# Patient Record
Sex: Male | Born: 1975 | Race: White | Hispanic: No | Smoking: Current every day smoker
Health system: Southern US, Community
[De-identification: ages and names within clinical notes are randomized; demographics above are authoritative.]

## PROBLEM LIST (undated history)

## (undated) DIAGNOSIS — C801 Malignant (primary) neoplasm, unspecified: Secondary | ICD-10-CM

## (undated) DIAGNOSIS — F1120 Opioid dependence, uncomplicated: Secondary | ICD-10-CM

## (undated) DIAGNOSIS — F909 Attention-deficit hyperactivity disorder, unspecified type: Secondary | ICD-10-CM

## (undated) DIAGNOSIS — F419 Anxiety disorder, unspecified: Secondary | ICD-10-CM

## (undated) DIAGNOSIS — B1920 Unspecified viral hepatitis C without hepatic coma: Secondary | ICD-10-CM

## (undated) DIAGNOSIS — F1190 Opioid use, unspecified, uncomplicated: Secondary | ICD-10-CM

## (undated) DIAGNOSIS — E785 Hyperlipidemia, unspecified: Secondary | ICD-10-CM

## (undated) DIAGNOSIS — N200 Calculus of kidney: Secondary | ICD-10-CM

## (undated) DIAGNOSIS — F1910 Other psychoactive substance abuse, uncomplicated: Secondary | ICD-10-CM

## (undated) DIAGNOSIS — F19959 Other psychoactive substance use, unspecified with psychoactive substance-induced psychotic disorder, unspecified: Secondary | ICD-10-CM

## (undated) DIAGNOSIS — F329 Major depressive disorder, single episode, unspecified: Secondary | ICD-10-CM

## (undated) DIAGNOSIS — F1994 Other psychoactive substance use, unspecified with psychoactive substance-induced mood disorder: Secondary | ICD-10-CM

## (undated) HISTORY — DX: Malignant (primary) neoplasm, unspecified: C80.1

## (undated) HISTORY — DX: Other psychoactive substance abuse, uncomplicated: F19.10

## (undated) HISTORY — PX: HERNIA REPAIR: SHX51

## (undated) HISTORY — DX: Anxiety disorder, unspecified: F41.9

---

## 1999-10-09 ENCOUNTER — Emergency Department (HOSPITAL_COMMUNITY): Admission: EM | Admit: 1999-10-09 | Discharge: 1999-10-09 | Payer: Self-pay | Admitting: Emergency Medicine

## 2002-07-14 ENCOUNTER — Emergency Department (HOSPITAL_COMMUNITY): Admission: EM | Admit: 2002-07-14 | Discharge: 2002-07-14 | Payer: Self-pay | Admitting: Emergency Medicine

## 2008-03-11 ENCOUNTER — Encounter (INDEPENDENT_AMBULATORY_CARE_PROVIDER_SITE_OTHER): Payer: Self-pay | Admitting: Emergency Medicine

## 2008-03-11 ENCOUNTER — Emergency Department (HOSPITAL_COMMUNITY): Admission: EM | Admit: 2008-03-11 | Discharge: 2008-03-11 | Payer: Self-pay | Admitting: Emergency Medicine

## 2008-03-11 ENCOUNTER — Ambulatory Visit: Payer: Self-pay | Admitting: Vascular Surgery

## 2008-05-07 ENCOUNTER — Emergency Department (HOSPITAL_COMMUNITY): Admission: EM | Admit: 2008-05-07 | Discharge: 2008-05-07 | Payer: Self-pay | Admitting: Emergency Medicine

## 2008-07-12 ENCOUNTER — Emergency Department (HOSPITAL_COMMUNITY): Admission: EM | Admit: 2008-07-12 | Discharge: 2008-07-12 | Payer: Self-pay | Admitting: Emergency Medicine

## 2008-11-28 ENCOUNTER — Emergency Department (HOSPITAL_COMMUNITY): Admission: EM | Admit: 2008-11-28 | Discharge: 2008-11-28 | Payer: Self-pay | Admitting: Emergency Medicine

## 2009-03-28 ENCOUNTER — Emergency Department (HOSPITAL_COMMUNITY): Admission: EM | Admit: 2009-03-28 | Discharge: 2009-03-28 | Payer: Self-pay | Admitting: Emergency Medicine

## 2009-04-09 ENCOUNTER — Emergency Department (HOSPITAL_COMMUNITY): Admission: EM | Admit: 2009-04-09 | Discharge: 2009-04-09 | Payer: Self-pay | Admitting: Emergency Medicine

## 2009-05-12 ENCOUNTER — Emergency Department (HOSPITAL_COMMUNITY): Admission: EM | Admit: 2009-05-12 | Discharge: 2009-05-12 | Payer: Self-pay | Admitting: Emergency Medicine

## 2009-07-17 ENCOUNTER — Emergency Department (HOSPITAL_COMMUNITY): Admission: EM | Admit: 2009-07-17 | Discharge: 2009-07-17 | Payer: Self-pay | Admitting: Emergency Medicine

## 2009-09-03 ENCOUNTER — Emergency Department (HOSPITAL_COMMUNITY): Admission: EM | Admit: 2009-09-03 | Discharge: 2009-09-03 | Payer: Self-pay | Admitting: Emergency Medicine

## 2009-09-09 ENCOUNTER — Emergency Department (HOSPITAL_COMMUNITY): Admission: EM | Admit: 2009-09-09 | Discharge: 2009-09-09 | Payer: Self-pay | Admitting: Emergency Medicine

## 2009-12-19 ENCOUNTER — Emergency Department (HOSPITAL_COMMUNITY): Admission: EM | Admit: 2009-12-19 | Discharge: 2009-12-19 | Payer: Self-pay | Admitting: Emergency Medicine

## 2010-01-19 ENCOUNTER — Emergency Department (HOSPITAL_COMMUNITY)
Admission: EM | Admit: 2010-01-19 | Discharge: 2010-01-19 | Payer: Self-pay | Source: Home / Self Care | Admitting: Emergency Medicine

## 2010-01-22 ENCOUNTER — Emergency Department (HOSPITAL_COMMUNITY)
Admission: EM | Admit: 2010-01-22 | Discharge: 2010-01-22 | Payer: Self-pay | Source: Home / Self Care | Admitting: Emergency Medicine

## 2010-04-08 ENCOUNTER — Emergency Department (HOSPITAL_COMMUNITY)
Admission: EM | Admit: 2010-04-08 | Discharge: 2010-04-09 | Disposition: A | Discharge status: Home / Self Care | Payer: Self-pay | Attending: Emergency Medicine | Admitting: Emergency Medicine

## 2010-04-08 DIAGNOSIS — K625 Hemorrhage of anus and rectum: Secondary | ICD-10-CM | POA: Insufficient documentation

## 2010-04-08 DIAGNOSIS — F17200 Nicotine dependence, unspecified, uncomplicated: Secondary | ICD-10-CM | POA: Insufficient documentation

## 2010-04-08 DIAGNOSIS — R110 Nausea: Secondary | ICD-10-CM | POA: Insufficient documentation

## 2010-04-08 DIAGNOSIS — K5900 Constipation, unspecified: Secondary | ICD-10-CM | POA: Insufficient documentation

## 2010-04-08 DIAGNOSIS — R109 Unspecified abdominal pain: Secondary | ICD-10-CM | POA: Insufficient documentation

## 2010-04-08 LAB — COMPREHENSIVE METABOLIC PANEL
Alkaline Phosphatase: 85 U/L (ref 39–117)
BUN: 13 mg/dL (ref 6–23)
CO2: 27 mEq/L (ref 19–32)
Chloride: 102 mEq/L (ref 96–112)
Creatinine, Ser: 0.82 mg/dL (ref 0.4–1.5)
GFR calc non Af Amer: >60 mL/min (ref >60)
Glucose, Bld: 149 mg/dL — ABNORMAL HIGH (ref 70–99)
Potassium: 3.7 mEq/L (ref 3.5–5.1)
Total Bilirubin: 0.6 mg/dL (ref 0.3–1.2)

## 2010-04-08 LAB — RAPID URINE DRUG SCREEN, HOSP PERFORMED
Barbiturates: NOT DETECTED
Cocaine: NOT DETECTED
Opiates: POSITIVE — AB

## 2010-04-08 LAB — DIFFERENTIAL
Basophils Absolute: 0 10*3/uL (ref 0.0–0.1)
Basophils Relative: 0 % (ref 0–1)
Eosinophils Relative: 2 % (ref 0–5)
Monocytes Absolute: 0.9 10*3/uL (ref 0.1–1.0)
Neutro Abs: 9.2 10*3/uL — ABNORMAL HIGH (ref 1.7–7.7)

## 2010-04-08 LAB — CBC
MCHC: 33.6 g/dL (ref 30.0–36.0)
RDW: 13.3 % (ref 11.5–15.5)
WBC: 13 10*3/uL — ABNORMAL HIGH (ref 4.0–10.5)

## 2010-04-08 LAB — ETHANOL: Alcohol, Ethyl (B): <5 mg/dL (ref 0–10)

## 2010-04-09 ENCOUNTER — Emergency Department (HOSPITAL_COMMUNITY)
Admission: EM | Admit: 2010-04-09 | Discharge: 2010-04-09 | Disposition: A | Discharge status: Home / Self Care | Payer: Self-pay | Attending: Emergency Medicine | Admitting: Emergency Medicine

## 2010-04-09 DIAGNOSIS — K625 Hemorrhage of anus and rectum: Secondary | ICD-10-CM | POA: Insufficient documentation

## 2010-04-09 DIAGNOSIS — R109 Unspecified abdominal pain: Secondary | ICD-10-CM | POA: Insufficient documentation

## 2010-04-09 DIAGNOSIS — K5900 Constipation, unspecified: Secondary | ICD-10-CM | POA: Insufficient documentation

## 2010-04-09 DIAGNOSIS — R110 Nausea: Secondary | ICD-10-CM | POA: Insufficient documentation

## 2010-04-09 LAB — OCCULT BLOOD, POC DEVICE: Fecal Occult Bld: POSITIVE

## 2010-04-13 ENCOUNTER — Emergency Department (HOSPITAL_COMMUNITY)
Admission: EM | Admit: 2010-04-13 | Discharge: 2010-04-13 | Discharge status: Against Medical Advice | Payer: Self-pay | Attending: Emergency Medicine | Admitting: Emergency Medicine

## 2010-04-13 ENCOUNTER — Emergency Department (HOSPITAL_COMMUNITY): Payer: Self-pay

## 2010-04-13 DIAGNOSIS — R079 Chest pain, unspecified: Secondary | ICD-10-CM | POA: Insufficient documentation

## 2010-04-13 DIAGNOSIS — Z5320 Procedure and treatment not carried out because of patient's decision for unspecified reasons: Secondary | ICD-10-CM | POA: Insufficient documentation

## 2010-05-02 LAB — DIFFERENTIAL
Basophils Absolute: 0 10*3/uL (ref 0.0–0.1)
Eosinophils Relative: 3 % (ref 0–5)
Lymphocytes Relative: 18 % (ref 12–46)
Neutrophils Relative %: 70 % (ref 43–77)

## 2010-05-02 LAB — URINALYSIS, ROUTINE W REFLEX MICROSCOPIC
Ketones, ur: 40 mg/dL — AB
Nitrite: NEGATIVE
Urobilinogen, UA: 1 mg/dL (ref 0.0–1.0)

## 2010-05-02 LAB — HEPATIC FUNCTION PANEL
ALT: 15 U/L (ref 0–53)
Indirect Bilirubin: 1.3 mg/dL — ABNORMAL HIGH (ref 0.3–0.9)
Total Protein: 7 g/dL (ref 6.0–8.3)

## 2010-05-02 LAB — BASIC METABOLIC PANEL
BUN: 6 mg/dL (ref 6–23)
Calcium: 9.4 mg/dL (ref 8.4–10.5)
Creatinine, Ser: 0.78 mg/dL (ref 0.4–1.5)
GFR calc non Af Amer: >60 mL/min (ref >60)
Glucose, Bld: 95 mg/dL (ref 70–99)
Potassium: 3.2 mEq/L — ABNORMAL LOW (ref 3.5–5.1)

## 2010-05-02 LAB — CBC
HCT: 50.4 % (ref 39.0–52.0)
Platelets: 219 10*3/uL (ref 150–400)
RDW: 12.9 % (ref 11.5–15.5)

## 2010-05-02 LAB — CK TOTAL AND CKMB (NOT AT ARMC)
Relative Index: 0.9 (ref 0.0–2.5)
Total CK: 482 U/L — ABNORMAL HIGH (ref 7–232)

## 2010-05-02 LAB — RAPID URINE DRUG SCREEN, HOSP PERFORMED: Tetrahydrocannabinol: POSITIVE — AB

## 2010-05-02 LAB — TROPONIN I: Troponin I: <0.01 ng/mL (ref 0.00–0.06)

## 2011-04-06 ENCOUNTER — Emergency Department (HOSPITAL_COMMUNITY)
Admission: EM | Admit: 2011-04-06 | Discharge: 2011-04-06 | Disposition: A | Discharge status: Home / Self Care | Payer: Self-pay | Attending: Emergency Medicine | Admitting: Emergency Medicine

## 2011-04-06 ENCOUNTER — Encounter (HOSPITAL_COMMUNITY): Payer: Self-pay | Admitting: Emergency Medicine

## 2011-04-06 DIAGNOSIS — J020 Streptococcal pharyngitis: Secondary | ICD-10-CM | POA: Insufficient documentation

## 2011-04-06 DIAGNOSIS — R509 Fever, unspecified: Secondary | ICD-10-CM | POA: Insufficient documentation

## 2011-04-06 HISTORY — DX: Opioid dependence, uncomplicated: F11.20

## 2011-04-06 HISTORY — DX: Opioid use, unspecified, uncomplicated: F11.90

## 2011-04-06 MED ORDER — AMOXICILLIN 500 MG PO CAPS: 500.0000 mg | ORAL_CAPSULE | Freq: Three times a day (TID) | ORAL | Status: AC

## 2011-04-06 MED ORDER — DEXAMETHASONE SODIUM PHOSPHATE 10 MG/ML IJ SOLN
10.0000 mg | Freq: Once | INTRAMUSCULAR | Status: AC
  Administered 2011-04-06: 10 mg via INTRAMUSCULAR
  Filled 2011-04-06: qty 1

## 2011-04-06 NOTE — ED Provider Notes (Signed)
History     CSN: [id number]  Arrival date & time 04/06/11  1754   First MD Initiated Contact with Patient 04/06/11 1837      Chief Complaint  Patient presents with  . Sore Throat    (Consider location/radiation/quality/duration/timing/severity/associated sxs/prior treatment) HPI Comments: Patient here with two day history of sore throat with fever, headache and swollen glands - reports pain with swallowing but is able to swallow without difficulty - fever to 102 today - denies shortness of breath, nausea or vomiting  Patient is a 36 y.o. male presenting with pharyngitis. The history is provided by the patient. No language interpreter was used.  Sore Throat This is a new problem. The current episode started yesterday. The problem occurs constantly. The problem has been unchanged. Associated symptoms include a fever, headaches, a sore throat and swollen glands. Pertinent negatives include no abdominal pain, anorexia, arthralgias, change in bowel habit, chest pain, chills, congestion, coughing, diaphoresis, fatigue, joint swelling, myalgias, nausea, neck pain, numbness, rash, urinary symptoms, vertigo, visual change, vomiting or weakness. The symptoms are aggravated by swallowing. He has tried nothing for the symptoms. The treatment provided no relief.    Past Medical History  Diagnosis Date  . Methadone use     clinic each day    History reviewed. No pertinent past surgical history.  No family history on file.  History  Substance Use Topics  . Smoking status: Not on file  . Smokeless tobacco: Not on file  . Alcohol Use:       Review of Systems  Constitutional: Positive for fever. Negative for chills, diaphoresis and fatigue.  HENT: Positive for sore throat. Negative for congestion and neck pain.   Respiratory: Negative for cough.   Cardiovascular: Negative for chest pain.  Gastrointestinal: Negative for nausea, vomiting, abdominal pain, anorexia and change in bowel habit.    Musculoskeletal: Negative for myalgias, joint swelling and arthralgias.  Skin: Negative for rash.  Neurological: Positive for headaches. Negative for vertigo, weakness and numbness.  All other systems reviewed and are negative.    Allergies  Review of patient's allergies indicates no known allergies.  Home Medications   Current Outpatient Rx  Name Route Sig Dispense Refill  . ACETAMINOPHEN 500 MG PO TABS Oral Take 1,000 mg by mouth every 6 (six) hours as needed. pain    . DIPHENHYDRAMINE HCL 25 MG PO CAPS Oral Take 25 mg by mouth every 6 (six) hours as needed. Allergies/sinus drainage    . METHADONE HCL 10 MG/5ML PO SOLN Oral Take 120 mg by mouth daily.      BP 124/74  Pulse 91  Temp(Src) 98.3 F (36.8 C) (Oral)  Resp 18  SpO2 100%  Physical Exam  Nursing note and vitals reviewed. Constitutional: He is oriented to person, place, and time. He appears well-developed and well-nourished. No distress.  HENT:  Head: Normocephalic and atraumatic.  Right Ear: External ear normal.  Left Ear: External ear normal.  Nose: Nose normal.  Mouth/Throat: Oropharyngeal exudate present.  Eyes: Conjunctivae are normal. Pupils are equal, round, and reactive to light. No scleral icterus.  Neck: Normal range of motion. Neck supple.       Anterior cervical lymphadenopathy  Cardiovascular: Normal rate and normal heart sounds.  Exam reveals no gallop and no friction rub.   No murmur heard. Pulmonary/Chest: Effort normal and breath sounds normal. No respiratory distress. He exhibits no tenderness.  Abdominal: Soft. Bowel sounds are normal. He exhibits no distension.  Musculoskeletal: Normal range  of motion. He exhibits no edema and no tenderness.  Lymphadenopathy:    He has cervical adenopathy.  Neurological: He is alert and oriented to person, place, and time. No cranial nerve deficit.  Skin: Skin is warm and dry. No rash noted. No erythema. No pallor.  Psychiatric: He has a normal mood and  affect. His behavior is normal. Judgment and thought content normal.    ED Course  Procedures (including critical care time)  Labs Reviewed - No data to display No results found.   Strep pharyngitis   MDM  Patient with CENTOR score of 4 - plan to treat for strep - will also give IM decadron for pain control.       Izola Price [place], [place] 04/06/11 1914

## 2011-04-06 NOTE — ED Provider Notes (Signed)
Medical screening examination/treatment/procedure(s) were performed by non-physician practitioner and as supervising physician I was immediately available for consultation/collaboration.   Loren Racer, MD 04/06/11 2352

## 2011-04-06 NOTE — Discharge Instructions (Signed)
Strep Infections Streptococcal (strep) infections are caused by streptococcal germs (bacteria). Strep infections are very contagious. Strep infections can occur in:  Ears.   The nose.   The throat.   Sinuses.   Skin.   Blood.   Lungs.   Spinal fluid.   Urine.  Strep throat is the most common bacterial infection in children. The symptoms of a Strep infection usually get better in 2 to 3 days after starting medicine that kills germs (antibiotics). Strep is usually not contagious after 36 to 48 hours of antibiotic treatment. Strep infections that are not treated can cause serious complications. These include gland infections, throat abscess, rheumatic fever and kidney disease. DIAGNOSIS  The diagnosis of strep is made by:  A culture for the strep germ.  TREATMENT  These infections require oral antibiotics for a full 10 days, an antibiotic shot or antibiotics given into the vein (intravenous, IV). HOME CARE INSTRUCTIONS   Be sure to finish all antibiotics even if feeling better.   Only take over-the-counter medicines for pain, discomfort and or fever, as directed by your caregiver.   Close contacts that have a fever, sore throat or illness symptoms should see their caregiver right away.   You or your child may return to work, school or daycare if the fever and pain are better in 2 to 3 days after starting antibiotics.  SEEK MEDICAL CARE IF:   You or your child has an oral temperature above 102 F (38.9 C).   Your baby is older than 3 months with a rectal temperature of 100.5 F (38.1 C) or higher for more than 1 day.   You or your child is not better in 3 days.  SEEK IMMEDIATE MEDICAL CARE IF:   You or your child has an oral temperature above 102 F (38.9 C), not controlled by medicine.   Your baby is older than 3 months with a rectal temperature of 102 F (38.9 C) or higher.   Your baby is 23 months old or younger with a rectal temperature of 100.4 F (38 C) or  higher.   There is a spreading rash.   There is difficulty swallowing or breathing.   There is increased pain or swelling.  Document Released: 03/09/2004 Document Revised: 10/12/2010 Document Reviewed: 12/16/2008 [facility] Patient Information 2012 [place], [place].Salt Water Gargle This solution will help make your mouth and throat feel better. HOME CARE INSTRUCTIONS   Mix 1 teaspoon of salt in 8 ounces of warm water.   Gargle with this solution as much or often as you need or as directed. Swish and gargle gently if you have any sores or wounds in your mouth.   Do not swallow this mixture.  Document Released: 11/04/2003 Document Revised: 10/12/2010 Document Reviewed: 03/27/2008 [facility] Patient Information 2012 [place], [place].Strep Throat Strep throat is an infection of the throat. It is caused by a germ. Strep throat spreads from person to person by coughing, sneezing, or close contact. HOME CARE  Rinse your mouth (gargle) with warm salt water (1 teaspoon salt in 1 cup of water). Do this 3 to 4 times per day or as needed for comfort.   Family members with a sore throat or fever should see a doctor.   Make sure everyone in your house washes their hands well.   Do not share food, drinking cups, or personal items.   Eat soft foods until your sore throat gets better.   Drink enough water and fluids to keep your pee (urine)  clear or pale yellow.   Rest.   Stay home from school, daycare, or work until you have taken medicine for 24 hours.   Only take medicine as told by your doctor.   Take your medicine as told. Finish it even if you start to feel better.  GET HELP RIGHT AWAY IF:   You have new problems, such as throwing up (vomiting) or bad headaches.   You have a stiff or painful neck, chest pain, trouble breathing, or trouble swallowing.   You have very bad throat pain, drooling, or changes in your voice.   Your neck puffs up (swells) or gets red and tender.   You have  a fever.   You are very tired, your mouth is dry, or you are peeing less than normal.   You cannot wake up completely.   You get a rash, cough, or earache.   You have green, yellow-brown, or bloody spit.   Your pain does not get better with medicine.  MAKE SURE YOU:   Understand these instructions.   Will watch your condition.   Will get help right away if you are not doing well or get worse.  Document Released: 07/19/2007 Document Revised: 10/12/2010 Document Reviewed: 03/31/2010 [facility] Patient Information 2012 [place], [place].

## 2011-09-07 ENCOUNTER — Emergency Department (HOSPITAL_COMMUNITY)
Admission: EM | Admit: 2011-09-07 | Discharge: 2011-09-09 | Disposition: A | Discharge status: Home / Self Care | Payer: Self-pay | Attending: Emergency Medicine | Admitting: Emergency Medicine

## 2011-09-07 ENCOUNTER — Encounter (HOSPITAL_COMMUNITY): Payer: Self-pay | Admitting: *Deleted

## 2011-09-07 DIAGNOSIS — Z008 Encounter for other general examination: Secondary | ICD-10-CM | POA: Insufficient documentation

## 2011-09-07 DIAGNOSIS — F1110 Opioid abuse, uncomplicated: Secondary | ICD-10-CM | POA: Insufficient documentation

## 2011-09-07 HISTORY — DX: Unspecified viral hepatitis C without hepatic coma: B19.20

## 2011-09-07 LAB — COMPREHENSIVE METABOLIC PANEL
AST: 55 U/L — ABNORMAL HIGH (ref 0–37)
Albumin: 4.3 g/dL (ref 3.5–5.2)
CO2: 22 mEq/L (ref 19–32)
Calcium: 9.6 mg/dL (ref 8.4–10.5)
Creatinine, Ser: 0.7 mg/dL (ref 0.50–1.35)
GFR calc non Af Amer: >90 mL/min (ref >90)

## 2011-09-07 LAB — RAPID URINE DRUG SCREEN, HOSP PERFORMED
Amphetamines: NOT DETECTED
Benzodiazepines: NOT DETECTED
Opiates: POSITIVE — AB

## 2011-09-07 LAB — CBC
MCH: 30.2 pg (ref 26.0–34.0)
MCV: 88.3 fL (ref 78.0–100.0)
Platelets: 305 10*3/uL (ref 150–400)
RDW: 13.1 % (ref 11.5–15.5)

## 2011-09-07 LAB — ACETAMINOPHEN LEVEL: Acetaminophen (Tylenol), Serum: <15 ug/mL (ref 10–30)

## 2011-09-07 LAB — ETHANOL: Alcohol, Ethyl (B): <11 mg/dL (ref 0–11)

## 2011-09-07 MED ORDER — ZOLPIDEM TARTRATE 5 MG PO TABS
5.0000 mg | ORAL_TABLET | Freq: Every evening | ORAL | Status: DC | PRN
  Administered 2011-09-07 – 2011-09-08 (×2): 5 mg via ORAL
  Filled 2011-09-07 (×2): qty 1

## 2011-09-07 MED ORDER — ALUM & MAG HYDROXIDE-SIMETH 200-200-20 MG/5ML PO SUSP: 30.0000 mL | ORAL | Status: DC | PRN

## 2011-09-07 MED ORDER — ONDANSETRON HCL 4 MG PO TABS: 4.0000 mg | ORAL_TABLET | Freq: Three times a day (TID) | ORAL | Status: DC | PRN

## 2011-09-07 MED ORDER — NICOTINE 21 MG/24HR TD PT24
21.0000 mg | MEDICATED_PATCH | Freq: Every day | TRANSDERMAL | Status: DC
  Administered 2011-09-07 – 2011-09-09 (×3): 21 mg via TRANSDERMAL
  Filled 2011-09-07 (×3): qty 1

## 2011-09-07 MED ORDER — IBUPROFEN 600 MG PO TABS: 600.0000 mg | ORAL_TABLET | Freq: Three times a day (TID) | ORAL | Status: DC | PRN

## 2011-09-07 MED ORDER — ACETAMINOPHEN 325 MG PO TABS: 650.0000 mg | ORAL_TABLET | ORAL | Status: DC | PRN

## 2011-09-07 NOTE — ED Provider Notes (Signed)
History     CSN: [id number]  Arrival date & time 09/07/11  1858   First MD Initiated Contact with Patient 09/07/11 1947      Chief Complaint  Patient presents with  . Addiction Problem    (Consider location/radiation/quality/duration/timing/severity/associated sxs/prior treatment) HPI Patient is a 36 year old male with history of opiate abuse with most recent use today. Patient has been using prescription drugs as well as injectable heroin. He reports that he would like inpatient rehabilitation. His last inpatient stay for this was a year ago. Patient tried methadone clinic but was unable to afford this. Patient has recently had more difficulties as he has been more depressed since finding out he is hepatitis C. He denies any pain or other complaints. He denies any suicidal or homicidal ideation. He denies any hallucinations. Patient has history of depression but no other mental health issues. He has been detoxed previously at [facility].  There are no other associated or modifying factors. Past Medical History  Diagnosis Date  . Methadone use     clinic each day  . Hepatitis C     Past Surgical History  Procedure Date  . Hernia repair     No family history on file.  History  Substance Use Topics  . Smoking status: Current Everyday Smoker  . Smokeless tobacco: Not on file  . Alcohol Use: No      Review of Systems  Constitutional: Negative.   HENT: Negative.   Eyes: Negative.   Respiratory: Negative.   Cardiovascular: Negative.   Gastrointestinal: Negative.   Genitourinary: Negative.   Musculoskeletal: Negative.   Neurological: Negative.   Hematological: Negative.   Psychiatric/Behavioral: Positive for dysphoric mood. Negative for suicidal ideas and hallucinations.       Substance abuse.  All other systems reviewed and are negative.    Allergies  Review of patient's allergies indicates no known allergies.  Home Medications   Current Outpatient Rx  Name Route  Sig Dispense Refill  . DIPHENHYDRAMINE HCL 25 MG PO CAPS Oral Take 25 mg by mouth every 6 (six) hours as needed. Allergies/sinus drainage    . OXYCODONE HCL ER 20 MG PO TB12 Oral Take 20 mg by mouth once.    . OXYCODONE-ACETAMINOPHEN 10-325 MG PO TABS Oral Take 1 tablet by mouth every 8 (eight) hours as needed. For pain.    . TRAZODONE HCL 100 MG PO TABS Oral Take 100 mg by mouth at bedtime.      BP 119/77  Pulse 95  Temp 99.1 F (37.3 C) (Oral)  Resp 20  Wt 222 lb (100.699 kg)  SpO2 97%  Physical Exam  Nursing note and vitals reviewed. GEN: Well-developed, well-nourished male in no distress HEENT: Atraumatic, normocephalic. Oropharynx clear without erythema EYES: PERRLA BL, no scleral icterus. NECK: Trachea midline, no meningismus CV: regular rate and rhythm. No murmurs, rubs, or gallops PULM: No respiratory distress.  No crackles, wheezes, or rales. GI: soft, non-tender. No guarding, rebound, or tenderness. + bowel sounds  GU: deferred Neuro: cranial nerves grossly 2-12 intact, no abnormalities of strength or sensation, A and O x 3 MSK: Patient moves all 4 extremities symmetrically, no deformity, edema, or injury noted Skin: No rashes petechiae, purpura, or jaundice Psych: Denies suicidal or homicidal ideation. Denies hallucinations. Patient requests detox from opiates abuse.  ED Course  Procedures (including critical care time)  Labs Reviewed  URINE RAPID DRUG SCREEN (HOSP PERFORMED) - Abnormal; Notable for the following:    Opiates POSITIVE (*)  All other components within normal limits  CBC - Abnormal; Notable for the following:    WBC 13.1 (*)     RBC 5.83 (*)     Hemoglobin 17.6 (*)     All other components within normal limits  COMPREHENSIVE METABOLIC PANEL - Abnormal; Notable for the following:    AST 55 (*)     ALT 62 (*)     All other components within normal limits  ETHANOL  ACETAMINOPHEN LEVEL   No results found.   1. Opiate abuse, continuous        MDM  Patient was evaluated by myself. Clearance labs were remarkable for being positive for opiates. Patient was not suicidal, homicidal, or psychotic. A consult was placed to act.        Cyndra Numbers, MD 09/07/11 2135

## 2011-09-07 NOTE — ED Notes (Signed)
Pt belongings: in triage locker 4.

## 2011-09-07 NOTE — ED Notes (Signed)
Pt reports he is here for detox from opiates. Pt reports taking oxycontin 20mg  last dose yesterday. Pt also took percocet 10mg   today at 12.  Pt denies ETOH and illegal drugs other than marijuana with last use a month ago.  Pt states he takes as many pills as he can afford each day, but has been unable to pay for much in the past week or two and states he now feels like he is withdrawing. Pt reports body aches, chills, nervousness and irritable. Pt denies SI/HI.  Pt was helped with detox from opiates about a month ago via a methadone clinic.

## 2011-09-07 NOTE — ED Notes (Signed)
Patient mother can be reached [phone] [name]).

## 2011-09-07 NOTE — ED Notes (Addendum)
Pt belongings: Clothing, sandals and wallet.  Placed in locker 2 triage.

## 2011-09-08 NOTE — ED Notes (Signed)
Pt up to bathroom.

## 2011-09-08 NOTE — ED Notes (Signed)
Up to bathroom

## 2011-09-08 NOTE — ED Notes (Signed)
Pt up using the phone 

## 2011-09-09 MED ORDER — CLONIDINE HCL 0.1 MG PO TABS
0.1000 mg | ORAL_TABLET | Freq: Two times a day (BID) | ORAL | Status: DC
  Filled 2011-09-09: qty 1

## 2011-09-09 MED ORDER — LORAZEPAM 1 MG PO TABS
1.0000 mg | ORAL_TABLET | Freq: Once | ORAL | Status: AC
  Administered 2011-09-09: 1 mg via ORAL
  Filled 2011-09-09: qty 1

## 2011-09-09 NOTE — ED Notes (Signed)
Dr kohut into see 

## 2011-09-09 NOTE — ED Notes (Signed)
[facility] here to transport.

## 2011-09-09 NOTE — BH Assessment (Signed)
Assessment Note   Victor Gomez is an 36 y.o. male who presents to [facility] voluntarily requesting detox and treatment for opiates. Pt reports using varying amounts of pain medication and heroin, daily, for the past 3 years.  He states he started using infrequently when he was 27. Pt states he started using daily after being laid off from work. He reports seeking treatment 2 years ago and being put on methadone. He states he used methadone for 1 year and then attempted to wean himself off due to it causing him to have night terrors. Pt reports while trying to wean himself of methadone he started using heroin again. Pt endorses withdrawal symptoms including, hot and cold flashes, sweating, anxiety, and headache. Pt is seeking longer term treatment.  Pt denies SI, HI, and AHVH. He reports he is highly motivated to change. He states "I want to get my life straightened out, I'm tired of living this way." He further states he wants treatment so he can "be with my little girl." Pt identifies 53 year old daughter and mother as social supports and motivators for change. Pt lives with his mother, who reports is very supportive. He can return to live with her after treatment.   Axis I: 304.00  Opioid Dependence  Axis II: Deferred Axis III:  Past Medical History  Diagnosis Date  . Methadone use     clinic each day  . Hepatitis C    Axis IV: other psychosocial or environmental problems and problems related to social environment Axis V: 51-60 moderate symptoms  Past Medical History:  Past Medical History  Diagnosis Date  . Methadone use     clinic each day  . Hepatitis C     Past Surgical History  Procedure Date  . Hernia repair     Family History: No family history on file.  Social History:  reports that he has been smoking.  He does not have any smokeless tobacco history on file. He reports that he uses illicit drugs (Marijuana). He reports that he does not drink alcohol.  Additional Social  History:  Alcohol / Drug Use History of alcohol / drug use?: Yes Substance #1 Name of Substance 1: opaites, both pain medication and heroin 1 - Age of First Use: 27 1 - Amount (size/oz): varries 1 - Frequency: daily 1 - Duration: 3 years 1 - Last Use / Amount: 4 days ago, 1 bag  CIWA: CIWA-Ar BP: 121/85 mmHg Pulse Rate: 89  COWS:    Allergies: No Known Allergies  Home Medications:  (Not in a hospital admission)  OB/GYN Status:  No LMP for male patient.  General Assessment Data Location of Assessment: [facility] ED Living Arrangements: Parent Can pt return to current living arrangement?: Yes Admission Status: Voluntary Is patient capable of signing voluntary admission?: Yes Transfer from: Acute Hospital Referral Source: MD  Education Status Is patient currently in school?: No Highest grade of school patient has completed: 10  Risk to self Suicidal Ideation: No Suicidal Intent: No Is patient at risk for suicide?: No Suicidal Plan?: No Access to Means: No What has been your use of drugs/alcohol within the last 12 months?: opaites Previous Attempts/Gestures: No How many times?: 0  Other Self Harm Risks: IV drug use Triggers for Past Attempts: None known Intentional Self Injurious Behavior: None Family Suicide History: No Recent stressful life event(s): Financial Problems;Job Loss Persecutory voices/beliefs?: No Depression: Yes Depression Symptoms: Guilt;Loss of interest in usual pleasures Substance abuse history and/or treatment for  substance abuse?: Yes Suicide prevention information given to non-admitted patients: Not applicable  Risk to Others Homicidal Ideation: No Thoughts of Harm to Others: No Current Homicidal Intent: No Current Homicidal Plan: No Access to Homicidal Means: No Identified Victim: none History of harm to others?: No Assessment of Violence: None Noted Violent Behavior Description: cooperative Does patient have access to weapons?: No Criminal  Charges Pending?: No Does patient have a court date: No  Psychosis Hallucinations: None noted Delusions: None noted  Mental Status Report Appear/Hygiene: Disheveled Eye Contact: Good Motor Activity: Unremarkable Speech: Logical/coherent Level of Consciousness: Quiet/awake Mood: Sad Affect: Appropriate to circumstance Anxiety Level: Minimal Thought Processes: Coherent;Relevant Judgement: Unimpaired Orientation: Person;Place;Time;Situation Obsessive Compulsive Thoughts/Behaviors: None  Cognitive Functioning Concentration: Normal Memory: Recent Intact;Remote Intact IQ: Average Insight: Fair Impulse Control: Poor Appetite: Fair Weight Loss: 0  Weight Gain: 0  Sleep: No Change Vegetative Symptoms: None  ADLScreening [facility] Assessment Services) Patient's cognitive ability adequate to safely complete daily activities?: Yes Patient able to express need for assistance with ADLs?: Yes Independently performs ADLs?: Yes  Abuse/Neglect [facility]) Physical Abuse: Denies Verbal Abuse: Denies Sexual Abuse: Denies  Prior Inpatient Therapy Prior Inpatient Therapy: Yes Prior Therapy Dates: 2011 Prior Therapy Facilty/Provider(s): [facility] Reason for Treatment: detox  Prior Outpatient Therapy Prior Outpatient Therapy: Yes Prior Therapy Dates: 2011 [facility] Reason for Treatment: SA and depression  ADL Screening (condition at time of admission) Patient's cognitive ability adequate to safely complete daily activities?: Yes Patient able to express need for assistance with ADLs?: Yes Independently performs ADLs?: Yes Weakness of Legs: None Weakness of Arms/Hands: None  Home Assistive Devices/Equipment Home Assistive Devices/Equipment: None    Abuse/Neglect Assessment (Assessment to be complete while patient is alone) Physical Abuse: Denies Verbal Abuse: Denies Sexual Abuse: Denies Exploitation of patient/patient's resources: Denies Self-Neglect:  Denies Values / Beliefs Cultural Requests During Hospitalization: None Spiritual Requests During Hospitalization: None   Advance Directives (For Healthcare) Advance Directive: Patient does not have advance directive;Patient would not like information Pre-existing out of facility DNR order (yellow form or pink MOST form): No Nutrition Screen Diet: Regular Unintentional weight loss greater than 10lbs within the last month: No Problems chewing or swallowing foods and/or liquids: No Home Tube Feeding or Total Parenteral Nutrition (TPN): No Patient appears severely malnourished: No  Additional Information 1:1 In Past 12 Months?: No CIRT Risk: No Elopement Risk: No Does patient have medical clearance?: Yes     Disposition:  Disposition Disposition of Patient: Referred to;Inpatient treatment program Type of inpatient treatment program: Adult Patient referred to: [facility]  On Site Evaluation by:   Reviewed with Physician:     Georgina Quint A 09/09/2011 2:16 AM

## 2011-09-09 NOTE — ED Notes (Signed)
Up to the desk on the phone 

## 2011-09-09 NOTE — ED Notes (Signed)
Report called to [facility] RN at [facility]

## 2011-09-09 NOTE — ED Notes (Signed)
1 duffle bag, 1 pt belonging bag give to pt

## 2011-10-04 ENCOUNTER — Encounter (HOSPITAL_COMMUNITY): Payer: Self-pay | Admitting: *Deleted

## 2011-10-04 ENCOUNTER — Emergency Department (HOSPITAL_COMMUNITY)
Admission: EM | Admit: 2011-10-04 | Discharge: 2011-10-04 | Disposition: A | Discharge status: Home / Self Care | Payer: Self-pay | Attending: Emergency Medicine | Admitting: Emergency Medicine

## 2011-10-04 ENCOUNTER — Emergency Department (HOSPITAL_COMMUNITY): Payer: Self-pay

## 2011-10-04 DIAGNOSIS — M7150 Other bursitis, not elsewhere classified, unspecified site: Secondary | ICD-10-CM

## 2011-10-04 DIAGNOSIS — W2209XA Striking against other stationary object, initial encounter: Secondary | ICD-10-CM | POA: Insufficient documentation

## 2011-10-04 DIAGNOSIS — M7020 Olecranon bursitis, unspecified elbow: Secondary | ICD-10-CM | POA: Insufficient documentation

## 2011-10-04 DIAGNOSIS — S59909A Unspecified injury of unspecified elbow, initial encounter: Secondary | ICD-10-CM | POA: Insufficient documentation

## 2011-10-04 DIAGNOSIS — M25529 Pain in unspecified elbow: Secondary | ICD-10-CM

## 2011-10-04 DIAGNOSIS — S6990XA Unspecified injury of unspecified wrist, hand and finger(s), initial encounter: Secondary | ICD-10-CM | POA: Insufficient documentation

## 2011-10-04 MED ORDER — IBUPROFEN 600 MG PO TABS: 600.0000 mg | ORAL_TABLET | Freq: Four times a day (QID) | ORAL | Status: AC | PRN

## 2011-10-04 NOTE — ED Provider Notes (Signed)
History     CSN: [id number]  Arrival date & time 10/04/11  1716   First MD Initiated Contact with Patient 10/04/11 1751      Chief Complaint  Patient presents with  . Elbow Injury    (Consider location/radiation/quality/duration/timing/severity/associated sxs/prior treatment) HPI Comments: Patient presents with complaint of left arm and elbow pain and swelling. Pain began yesterday after the patient struck his left elbow on a water slide. Overnight, the elbow began to swell. Patient has been using ibuprofen for pain. Movement of the elbow makes the pain worse. Nothing makes it better. Onset was acute. Course is constant. Patient denies other injuries. Pain does not radiate. He denies numbness, tingling, swelling in his left arm or hand.  Patient is a 36 y.o. male presenting with arm injury. The history is provided by the patient.  Arm Injury  The incident occurred yesterday. The injury mechanism was a direct blow. The wounds were not self-inflicted. Pertinent negatives include no numbness, no neck pain and no weakness.    Past Medical History  Diagnosis Date  . Methadone use     clinic each day  . Hepatitis C     Past Surgical History  Procedure Date  . Hernia repair     No family history on file.  History  Substance Use Topics  . Smoking status: Current Everyday Smoker  . Smokeless tobacco: Not on file  . Alcohol Use: No      Review of Systems  Constitutional: Negative for activity change.  HENT: Negative for neck pain.   Musculoskeletal: Positive for arthralgias. Negative for back pain, joint swelling and gait problem.  Skin: Negative for wound.  Neurological: Negative for weakness and numbness.    Allergies  Review of patient's allergies indicates no known allergies.  Home Medications   Current Outpatient Rx  Name Route Sig Dispense Refill  . DIPHENHYDRAMINE HCL 25 MG PO CAPS Oral Take 25 mg by mouth every 6 (six) hours as needed. Allergies/sinus  drainage    . IBUPROFEN 200 MG PO TABS Oral Take 400 mg by mouth every 6 (six) hours as needed. Pain.    . OXYCODONE HCL ER 20 MG PO TB12 Oral Take 20 mg by mouth once.    . OXYCODONE-ACETAMINOPHEN 10-325 MG PO TABS Oral Take 1 tablet by mouth every 8 (eight) hours as needed. For pain.    . TRAZODONE HCL 100 MG PO TABS Oral Take 100 mg by mouth at bedtime.      BP 137/72  Pulse 90  Temp 98.3 F (36.8 C) (Oral)  Resp 17  SpO2 98%  Physical Exam  Nursing note and vitals reviewed. Constitutional: He appears well-developed and well-nourished.  HENT:  Head: Normocephalic and atraumatic.  Eyes: Conjunctivae are normal.  Neck: Normal range of motion. Neck supple.  Cardiovascular: Normal pulses.   Pulses:      Radial pulses are 2+ on the right side, and 2+ on the left side.  Musculoskeletal: Normal range of motion. He exhibits edema and tenderness.       Right shoulder: Normal. He exhibits normal range of motion.       Right elbow: He exhibits swelling. He exhibits normal range of motion, no effusion and no deformity. tenderness found. Olecranon process tenderness noted.       Right wrist: Normal. He exhibits normal range of motion.       Cervical back: He exhibits normal range of motion, no tenderness and no bony tenderness.  Swelling and tenderness over R olecranon.   Neurological: He is alert. No sensory deficit.       Motor, sensation, and vascular distal to the injury is fully intact.   Skin: Skin is warm and dry.  Psychiatric: He has a normal mood and affect.    ED Course  Procedures (including critical care time)  Labs Reviewed - No data to display No results found.   1. Bursitis due to trauma   2. Elbow pain     6:05 PM Patient seen and examined. Work-up initiated. Suspect bursitis.   Vital signs reviewed and are as follows: Filed Vitals:   10/04/11 1727  BP: 137/72  Pulse: 90  Temp: 98.3 F (36.8 C)  Resp: 17   6:38 PM X-ray negative. Patient to  followup with orthopedic physician if no improvement in one week. Patient urged to return with worsening swelling, redness, pain, symptoms of infection. Patient verbalizes understanding and agrees with plan.  Patient was counseled on RICE protocol and told to rest injury, use ice for no longer than 15 minutes every hour, compress the area, and elevate above the level of their heart as much as possible to reduce swelling.  Questions answered.  Patient verbalized understanding.      MDM  Bursitis 2/2 trauma to the elbow. X-rays are negative. Patients provided sling by orthopedic technician. Patient to followup with orthopedic physician if not improved in one week. Otherwise, rice protocol indicated.        Renne Crigler, [place] 10/04/11 1840

## 2011-10-04 NOTE — ED Notes (Signed)
Pt reports he hit left elbow while going down a water-slide yesterday. Pt reports using ibuprofen for pain. Elbow is notably swollen.  Pt reports more pain with flexion.

## 2012-06-28 ENCOUNTER — Encounter (HOSPITAL_COMMUNITY): Payer: Self-pay

## 2012-06-28 ENCOUNTER — Emergency Department (HOSPITAL_COMMUNITY)
Admission: EM | Admit: 2012-06-28 | Discharge: 2012-06-29 | Disposition: A | Discharge status: Home / Self Care | Payer: Self-pay | Attending: Emergency Medicine | Admitting: Emergency Medicine

## 2012-06-28 DIAGNOSIS — Z79899 Other long term (current) drug therapy: Secondary | ICD-10-CM | POA: Insufficient documentation

## 2012-06-28 DIAGNOSIS — T43502A Poisoning by unspecified antipsychotics and neuroleptics, intentional self-harm, initial encounter: Secondary | ICD-10-CM | POA: Insufficient documentation

## 2012-06-28 DIAGNOSIS — Z8619 Personal history of other infectious and parasitic diseases: Secondary | ICD-10-CM | POA: Insufficient documentation

## 2012-06-28 DIAGNOSIS — F17200 Nicotine dependence, unspecified, uncomplicated: Secondary | ICD-10-CM | POA: Insufficient documentation

## 2012-06-28 DIAGNOSIS — T424X4A Poisoning by benzodiazepines, undetermined, initial encounter: Secondary | ICD-10-CM | POA: Insufficient documentation

## 2012-06-28 NOTE — ED Notes (Signed)
Spoke with CJ from Poison Control-states to monitor patient for 8 hours, if no further Narcan is administered-pt may be discharged.

## 2012-06-28 NOTE — ED Provider Notes (Signed)
History     CSN: [id number]  Arrival date & time 06/28/12  2043   First MD Initiated Contact with Patient 06/28/12 2107      Chief Complaint  Patient presents with  . Drug Overdose    heroin    (Consider location/radiation/quality/duration/timing/severity/associated sxs/prior treatment) HPI Patient injected himself with heroin into his left hand one hour prior to admission. He became unconscious. EMS was called to find patient apneic, administered Narcan 2 mg IV the patient became fully alert and awake. He admits to using alcohol yesterday and admits to using Xanax yesterday. He is presently asymptomatic. Past Medical History  Diagnosis Date  . Methadone use     clinic each day  . Hepatitis C     Past Surgical History  Procedure Laterality Date  . Hernia repair      No family history on file.  History  Substance Use Topics  . Smoking status: Current Every Day Smoker  . Smokeless tobacco: Not on file  . Alcohol Use: No   positive alcohol, positive for IV drug use.    Review of Systems  Constitutional: Negative.   HENT: Negative.   Respiratory: Negative.   Cardiovascular: Negative.   Gastrointestinal: Negative.   Musculoskeletal: Negative.   Skin: Negative.   Neurological: Negative.   Psychiatric/Behavioral: Negative.     Allergies  Review of patient's allergies indicates no known allergies.  Home Medications   Current Outpatient Rx  Name  Route  Sig  Dispense  Refill  . diphenhydrAMINE (BENADRYL) 25 mg capsule   Oral   Take 25 mg by mouth every 6 (six) hours as needed. Allergies/sinus drainage         . oxyCODONE (OXYCONTIN) 20 MG 12 hr tablet   Oral   Take 20 mg by mouth once.         Marland Kitchen oxyCODONE-acetaminophen (PERCOCET) 10-325 MG per tablet   Oral   Take 1 tablet by mouth every 8 (eight) hours as needed. For pain.         . traZODone (DESYREL) 100 MG tablet   Oral   Take 100 mg by mouth at bedtime.           BP 129/84  Pulse 118   Temp(Src) 98 F (36.7 C) (Oral)  Resp 20  SpO2 94%  Physical Exam  Nursing note and vitals reviewed. Constitutional: He is oriented to person, place, and time. He appears well-developed and well-nourished. No distress.  HENT:  Head: Normocephalic and atraumatic.  Eyes: Conjunctivae are normal. Pupils are equal, round, and reactive to light.  Neck: Neck supple. No tracheal deviation present. No thyromegaly present.  Cardiovascular: Regular rhythm.   No murmur heard. Mildly tachycardic  Pulmonary/Chest: Effort normal and breath sounds normal.  Abdominal: Soft. Bowel sounds are normal. He exhibits no distension. There is no tenderness.  Musculoskeletal: Normal range of motion. He exhibits no edema and no tenderness.  Neurological: He is alert and oriented to person, place, and time. Coordination normal.  Coma score of 15  Skin: Skin is warm and dry. No rash noted.  Needle mark at the ulnar aspect of dorsum of left hand. No redness no swelling  Psychiatric: He has a normal mood and affect.    ED Course  Procedures (including critical care time) 10:15 PM patient remains alert Glasgow Coma Score 15 Labs Reviewed - No data to display No results found. 11:50 PM patient remains alert Glasgow Coma Score 15 remains pleasant and cooperative  No  diagnosis found.   Spoke with poison control suggested that the patient for 8 hours then safe for discharge, which will be at 4 AM. If the patient needs to be redosed with Narcan, admit MDM  Patient signed out to Dr.Opitz 12 min Diagnosis drug overdose        Doug Sou, MD 06/29/12 1610

## 2012-06-28 NOTE — ED Notes (Signed)
Narcan 2 mg IV administered intra-nasal per EMS

## 2012-06-28 NOTE — ED Notes (Signed)
ZOX:WRUE<AV> Expected date:<BR> Expected time:<BR> Means of arrival:<BR> Comments:<BR> EMS/heroin overdose

## 2012-06-28 NOTE — ED Notes (Signed)
Pt arrives by EMS-alert and oriented-per EMS, pt shot up heroin in a bathroom at home-Mother called EMS/pt apneic on EMS arrival-Narcan 2 mg IV administered.

## 2012-06-28 NOTE — ED Notes (Signed)
[employer] in room. Pt placed on 2l per  for 88% sats.  Pt alert and talking

## 2012-06-29 NOTE — ED Notes (Signed)
[id number]<AV> Expected date:<BR> Expected time:<BR> Means of arrival:<BR> Comments:<BR> RES B

## 2012-06-29 NOTE — ED Notes (Signed)
Dr Dierdre Highman updated on occult stool results.

## 2012-06-29 NOTE — ED Notes (Signed)
Mom has left ?

## 2012-06-29 NOTE — ED Notes (Signed)
Assisted patient to room. Patient verbalized that he has been having dark "bloody" stools at home. Patient requesting snack and drink. Patient advised until further evaluation he wouldn't be able to have anything to eat or drink.

## 2013-01-30 ENCOUNTER — Ambulatory Visit: Payer: No Typology Code available for payment source | Attending: Internal Medicine | Admitting: Internal Medicine

## 2013-01-30 VITALS — BP 133/96 | HR 91 | Temp 97.7°F | Resp 17 | Wt 223.4 lb

## 2013-01-30 DIAGNOSIS — B182 Chronic viral hepatitis C: Secondary | ICD-10-CM

## 2013-01-30 DIAGNOSIS — B1920 Unspecified viral hepatitis C without hepatic coma: Secondary | ICD-10-CM | POA: Insufficient documentation

## 2013-01-30 NOTE — Progress Notes (Signed)
Patient here to establish care History of hep C Has a spot near hairline on the right side of head that has been there about a year Needs dental referral

## 2013-01-30 NOTE — Patient Instructions (Signed)
Hepatitis C °Hepatitis C is a viral infection of the liver. Infection may go undetected for months or years because symptoms may be absent or very mild. Chronic liver disease is the main danger of hepatitis C. This may lead to scarring of the liver (cirrhosis), liver failure, and liver cancer. °CAUSES  °Hepatitis C is caused by the hepatitis C virus (HCV). Formerly, hepatitis C infections were most commonly transmitted through blood transfusions. In the early 1990s, routine testing of donated blood for hepatitis C and exclusion of blood that tests positive for HCV began. Now, HCV is most commonly transmitted from person to person through injection drug use, sharing needles, or sex with an infected person. A caregiver may also get the infection from exposure to the blood of an infected patient by way of a cut or needle stick.  °SYMPTOMS  °Acute Phase °Many cases of acute HCV infection are mild and cause few problems. Some people may not even realize they are sick. Symptoms in others may last a few weeks to several months and include: °· Feeling very tired. °· Loss of appetite. °· Nausea. °· Vomiting. °· Abdominal pain. °· Dark yellow urine. °· Yellow skin and eyes (jaundice). °· Itching of the skin. °Chronic Phase °· Between 50% to 85% of people who get HCV infection become "chronic carriers." They often have no symptoms, but the virus stays in their body. They may spread the virus to others and can get long-term liver disease. °· Many people with chronic HCV infection remain healthy for many years. However, up to 1 in 5 chronically infected people may develop severe liver diseases including scarring of the liver (cirrhosis), liver failure, or liver cancer. °DIAGNOSIS  °Diagnosis of hepatitis C infection is made by testing blood for the presence of hepatitis C viral particles called RNA. Other tests may also be done to measure the status of current liver function, exclude other liver problems, or assess liver  damage. °TREATMENT  °Treatment with many antiviral drugs is available and recommended for some patients with chronic HCV infection. Drug treatment is generally considered appropriate for patients who: °· Are 18 years of age or older. °· Have a positive test for HCV particles in the blood. °· Have a liver tissue sample (biopsy) that shows chronic hepatitis and significant scarring (fibrosis). °· Do not have signs of liver failure. °· Have acceptable blood test results that confirm the wellness of other body organs. °· Are willing to be treated and conform to treatment requirements. °· Have no other circumstances that would prevent treatment from being recommended (contraindications). °All people who are offered and choose to receive drug treatment must understand that careful medical follow up for many months and even years is crucial in order to make successful care possible. The goal of drug treatment is to eliminate any evidence of HCV in the blood on a long-term basis. This is called a "sustained virologic response" or SVR. Achieving a SVR is associated with a decrease in the chance of life-threatening liver problems, need for a liver transplant, liver cancer rates, and liver-related complications. °Successful treatment currently requires taking treatment drugs for at least 24 weeks and up to 72 weeks. An injected drug (interferon) given weekly and an oral antiviral medicine taken daily are usually prescribed. Side effects from these drugs are common and some may be very serious. Your response to treatment must be carefully monitored by both you and your caregiver throughout the entire treatment period. °PREVENTION °There is no vaccine for hepatitis C. The only   way to prevent the disease is to reduce the risk of exposure to the virus.  °· Avoid sharing drug needles or personal items like toothbrushes, razors, and nail clippers with an infected person. °· Healthcare workers need to avoid injuries and wear  appropriate protective equipment such as gloves, gowns, and face masks when performing invasive medical or nursing procedures. °HOME CARE INSTRUCTIONS  °To avoid making your liver disease worse: °· Strictly avoid drinking alcohol. °· Carefully review all new prescriptions of medicines with your caregiver. Ask your caregiver which drugs you should avoid. The following drugs are toxic to the liver, and your caregiver may tell you to avoid them: °· Isoniazid. °· Methyldopa. °· Acetaminophen. °· Anabolic steroids (muscle-building drugs). °· Erythromycin. °· Oral contraceptives (birth control pills). °· Check with your caregiver to make sure medicine you are currently taking will not be harmful. °· Periodic blood tests may be required. Follow your caregiver's advice about when you should have blood tests. °· Avoid a sexual relationship until advised otherwise by your caregiver. °· Avoid activities that could expose other people to your blood. Examples include sharing a toothbrush, nail clippers, razors, and needles. °· Bed rest is not necessary, but it may make you feel better. Recovery time is not related to the amount of rest you receive. °· This infection is contagious. Follow your caregiver's instructions in order to avoid spread of the infection. °SEEK IMMEDIATE MEDICAL CARE IF: °· You have increasing fatigue or weakness. °· You have an oral temperature above 102° F (38.9° C), not controlled by medicine. °· You develop loss of appetite, nausea, or vomiting. °· You develop jaundice. °· You develop easy bruising or bleeding. °· You develop any severe problems as a result of your treatment. °MAKE SURE YOU:  °· Understand these instructions. °· Will watch your condition. °· Will get help right away if you are not doing well or get worse. °Document Released: 01/28/2000 Document Revised: 04/24/2011 Document Reviewed: 06/01/2010 °[employer]® Patient Information ©2014 [employer]. ° °

## 2013-01-30 NOTE — Progress Notes (Signed)
Patient ID: Victor Gomez, male   DOB: 01/14/1976, 37 y.o.   MRN: [id number]  CC: blood tests   HPI: Patient is 37 year old male who comes to clinic requesting blood test for hepatitis. He explains he was diagnosed with hepatitis in the recent past but he is not sure if it was hepatitis B, C or some other type of hepatitis. He would like to have a referral to specialist if he truly has hepatitis. He denies any specific abdominal concerns, no changes in skin color. He is feeling at his baseline. He denies fevers and chills, no specific urinary concerns. He denies drug or alcohol use   No Known Allergies Past Medical History  Diagnosis Date  . Methadone use     clinic each day  . Hepatitis C    Current Outpatient Prescriptions on File Prior to Visit  Medication Sig Dispense Refill  . alprazolam (XANAX) 2 MG tablet Take 2 mg by mouth at bedtime as needed for sleep.      . diphenhydrAMINE (BENADRYL) 25 mg capsule Take 25 mg by mouth every 6 (six) hours as needed for itching or allergies. Allergies/sinus drainage      . fexofenadine (ALLEGRA) 180 MG tablet Take 180 mg by mouth daily as needed (allergies).       No current facility-administered medications on file prior to visit.   no known family medical history  History   Social History  . Marital Status: Legally Separated    Spouse Name: N/A    Number of Children: N/A  . Years of Education: N/A   Occupational History  . Not on file.   Social History Main Topics  . Smoking status: Current Every Day Smoker  . Smokeless tobacco: Not on file  . Alcohol Use: No  . Drug Use: Yes    Special: Marijuana     Comment: heroin use  . Sexual Activity: Not on file   Other Topics Concern  . Not on file   Social History Narrative  . No narrative on file    Review of Systems  Constitutional: Negative for fever, chills, diaphoresis, activity change, appetite change and fatigue.  HENT: Negative for ear pain, nosebleeds, congestion, facial  swelling, rhinorrhea, neck pain, neck stiffness and ear discharge.   Eyes: Negative for pain, discharge, redness, itching and visual disturbance.  Respiratory: Negative for cough, choking, chest tightness, shortness of breath, wheezing and stridor.   Cardiovascular: Negative for chest pain, palpitations and leg swelling.  Gastrointestinal: Negative for abdominal distention.  Genitourinary: Negative for dysuria, urgency, frequency, hematuria, flank pain, decreased urine volume, difficulty urinating and dyspareunia.  Musculoskeletal: Negative for back pain, joint swelling, arthralgias and gait problem.  Neurological: Negative for dizziness, tremors, seizures, syncope, facial asymmetry, speech difficulty, weakness, light-headedness, numbness and headaches.  Hematological: Negative for adenopathy. Does not bruise/bleed easily.  Psychiatric/Behavioral: Negative for hallucinations, behavioral problems, confusion, dysphoric mood, decreased concentration and agitation.    Objective:   Filed Vitals:   01/30/13 1654  BP: 133/96  Pulse: 91  Temp: 97.7 F (36.5 C)  Resp: 17    Physical Exam  Constitutional: Appears well-developed and well-nourished. No distress.  CVS: RRR, S1/S2 +, no murmurs, no gallops, no carotid bruit.  Pulmonary: Effort and breath sounds normal, no stridor, rhonchi, wheezes, rales.  Abdominal: Soft. BS +,  no distension, tenderness, rebound or guarding.  Musculoskeletal: Normal range of motion. No edema and no tenderness.    Lab Results  Component Value Date   WBC  13.1* 09/07/2011   HGB 17.6* 09/07/2011   HCT 51.5 09/07/2011   MCV 88.3 09/07/2011   PLT 305 09/07/2011   Lab Results  Component Value Date   CREATININE 0.70 09/07/2011   BUN 11 09/07/2011   NA 137 09/07/2011   K 4.0 09/07/2011   CL 101 09/07/2011   CO2 22 09/07/2011    No results found for this basename: HGBA1C   Lipid Panel  No results found for this basename: chol, trig, hdl, cholhdl, vldl, ldlcalc        Assessment and plan:   Questionable hepatitis - will order liver function tests, HIV test, acute hepatitis panel for further evaluation. Referral to infectious disease or GI (hepatitis specialist if hepatitis panel is positive) may be required based on the results.

## 2013-01-31 LAB — COMPREHENSIVE METABOLIC PANEL
Alkaline Phosphatase: 78 U/L (ref 39–117)
Calcium: 9.5 mg/dL (ref 8.4–10.5)
Creat: 0.81 mg/dL (ref 0.50–1.35)
Glucose, Bld: 104 mg/dL — ABNORMAL HIGH (ref 70–99)
Sodium: 138 mEq/L (ref 135–145)
Total Bilirubin: 0.6 mg/dL (ref 0.3–1.2)
Total Protein: 7.4 g/dL (ref 6.0–8.3)

## 2013-01-31 LAB — HEPATITIS PANEL, ACUTE: HCV Ab: REACTIVE — AB

## 2013-01-31 LAB — HIV ANTIBODY (ROUTINE TESTING W REFLEX): HIV: NONREACTIVE

## 2013-02-12 ENCOUNTER — Telehealth: Payer: Self-pay | Admitting: Emergency Medicine

## 2013-02-12 NOTE — Telephone Encounter (Signed)
Spoke with pt regarding lab results and appt schedule. Pt has f/u appt 03/04/13 with Dr. Orpah Cobb

## 2013-02-12 NOTE — Telephone Encounter (Signed)
Message copied by Darlis Loan on Wed Feb 12, 2013 10:00 AM ------      Message from: Dorothea Ogle      Created: Wed Feb 12, 2013  9:37 AM       Hi Noreene Larsson, can we let pt know he needs to come in to discuss findings of the blood tests and need for ID referral.             Thank you      Iskra ------

## 2013-03-04 ENCOUNTER — Ambulatory Visit: Payer: No Typology Code available for payment source | Admitting: Internal Medicine

## 2013-03-12 ENCOUNTER — Ambulatory Visit: Payer: No Typology Code available for payment source

## 2013-03-13 ENCOUNTER — Ambulatory Visit: Payer: No Typology Code available for payment source | Attending: Internal Medicine | Admitting: Internal Medicine

## 2013-03-13 ENCOUNTER — Encounter: Payer: Self-pay | Admitting: Internal Medicine

## 2013-03-13 VITALS — BP 131/76 | HR 79 | Temp 98.2°F | Resp 16 | Ht 72.0 in | Wt 225.0 lb

## 2013-03-13 DIAGNOSIS — R7401 Elevation of levels of liver transaminase levels: Secondary | ICD-10-CM

## 2013-03-13 DIAGNOSIS — R740 Nonspecific elevation of levels of transaminase and lactic acid dehydrogenase [LDH]: Secondary | ICD-10-CM

## 2013-03-13 DIAGNOSIS — Z09 Encounter for follow-up examination after completed treatment for conditions other than malignant neoplasm: Secondary | ICD-10-CM

## 2013-03-13 DIAGNOSIS — B1920 Unspecified viral hepatitis C without hepatic coma: Secondary | ICD-10-CM | POA: Insufficient documentation

## 2013-03-13 DIAGNOSIS — R768 Other specified abnormal immunological findings in serum: Secondary | ICD-10-CM

## 2013-03-13 DIAGNOSIS — R7402 Elevation of levels of lactic acid dehydrogenase (LDH): Secondary | ICD-10-CM

## 2013-03-13 DIAGNOSIS — R894 Abnormal immunological findings in specimens from other organs, systems and tissues: Secondary | ICD-10-CM

## 2013-03-13 HISTORY — DX: Elevation of levels of liver transaminase levels: R74.01

## 2013-03-13 NOTE — Progress Notes (Signed)
Pt here to discuss Hep panel results per Dr.Myers and referral for Infectious Disease Pt denies any problems today

## 2013-03-13 NOTE — Progress Notes (Signed)
MRN: [id number] Name: Victor Gomez  Sex: male Age: 38 y.o. DOB: 15-Sep-1975  Allergies: Review of patient's allergies indicates no known allergies.  Chief Complaint  Patient presents with  . Follow-up    HPI: Patient is 38 y.o. male who was seen by Dr. Izola Price on the last visit and blood work was done which came back positive for hepatitis C antibody patient has history of elevated transaminases, as per patient one year ago he had been diagnosed with hepatitis C but never seen by infectious disease, patient denies any abdominal pain nausea vomiting change in bowel habits.  Past Medical History  Diagnosis Date  . Methadone use     clinic each day  . Hepatitis C     Past Surgical History  Procedure Laterality Date  . Hernia repair        Medication List       This list is accurate as of: 03/13/13 12:31 PM.  Always use your most recent med list.               alprazolam 2 MG tablet  Commonly known as:  XANAX  Take 2 mg by mouth at bedtime as needed for sleep.     diphenhydrAMINE 25 mg capsule  Commonly known as:  BENADRYL  Take 25 mg by mouth every 6 (six) hours as needed for itching or allergies. Allergies/sinus drainage     fexofenadine 180 MG tablet  Commonly known as:  ALLEGRA  Take 180 mg by mouth daily as needed (allergies).        No orders of the defined types were placed in this encounter.     There is no immunization history on file for this patient.  History reviewed. No pertinent family history.  History  Substance Use Topics  . Smoking status: Current Every Day Smoker  . Smokeless tobacco: Not on file  . Alcohol Use: No    Review of Systems  As noted in HPI  Filed Vitals:   03/13/13 1218  BP: 131/76  Pulse: 79  Temp: 98.2 F (36.8 C)  Resp: 16    Physical Exam  Physical Exam  Constitutional: No distress.  Eyes: Conjunctivae and EOM are normal. Pupils are equal, round, and reactive to light.  Cardiovascular: Normal rate and  regular rhythm.   Pulmonary/Chest: Breath sounds normal. No respiratory distress. He has no wheezes. He has no rales.  Abdominal: Soft. There is no tenderness. There is no rebound.    CBC    Component Value Date/Time   WBC 13.1* 09/07/2011 1950   RBC 5.83* 09/07/2011 1950   HGB 17.6* 09/07/2011 1950   HCT 51.5 09/07/2011 1950   PLT 305 09/07/2011 1950   MCV 88.3 09/07/2011 1950   LYMPHSABS 2.8 04/08/2010 1443   MONOABS 0.9 04/08/2010 1443   EOSABS 0.2 04/08/2010 1443   BASOSABS 0.0 04/08/2010 1443    CMP     Component Value Date/Time   NA 138 01/30/2013 1716   K 4.2 01/30/2013 1716   CL 101 01/30/2013 1716   CO2 27 01/30/2013 1716   GLUCOSE 104* 01/30/2013 1716   BUN 13 01/30/2013 1716   CREATININE 0.81 01/30/2013 1716   CREATININE 0.70 09/07/2011 1950   CALCIUM 9.5 01/30/2013 1716   PROT 7.4 01/30/2013 1716   ALBUMIN 4.6 01/30/2013 1716   AST 62* 01/30/2013 1716   ALT 58* 01/30/2013 1716   ALKPHOS 78 01/30/2013 1716   BILITOT 0.6 01/30/2013 1716   GFRNONAA >90 09/07/2011  1950   GFRAA >90 09/07/2011 1950    No results found for this basename: chol, tri, ldl    No components found with this basename: hga1c    Lab Results  Component Value Date/Time   AST 62* 01/30/2013  5:16 PM    Assessment and Plan  Follow up  Hepatitis C antibody test positive - Plan: Ambulatory referral to Infectious Disease  Elevated transaminase level - Plan: Ambulatory referral to Infectious Disease  Doris CheadleADVANI, Lynnel Zanetti, MD

## 2013-04-17 ENCOUNTER — Ambulatory Visit: Payer: No Typology Code available for payment source | Attending: Internal Medicine | Admitting: Internal Medicine

## 2013-04-17 VITALS — BP 116/74 | HR 69 | Temp 98.7°F | Resp 17 | Wt 224.8 lb

## 2013-04-17 DIAGNOSIS — K029 Dental caries, unspecified: Secondary | ICD-10-CM | POA: Insufficient documentation

## 2013-04-17 DIAGNOSIS — F17200 Nicotine dependence, unspecified, uncomplicated: Secondary | ICD-10-CM | POA: Insufficient documentation

## 2013-04-17 DIAGNOSIS — B1920 Unspecified viral hepatitis C without hepatic coma: Secondary | ICD-10-CM | POA: Insufficient documentation

## 2013-04-17 MED ORDER — IBUPROFEN 600 MG PO TABS: 600.0000 mg | ORAL_TABLET | Freq: Three times a day (TID) | ORAL | Status: DC | PRN

## 2013-04-17 MED ORDER — AMOXICILLIN-POT CLAVULANATE 875-125 MG PO TABS: 1.0000 | ORAL_TABLET | Freq: Two times a day (BID) | ORAL | Status: DC

## 2013-04-17 NOTE — Progress Notes (Signed)
Patient ID: Victor SprayJason L Olmsted, male   DOB: Sep 17, 1975, 38 y.o.   MRN: [id number]   CC:  HPI:  38 year old male here to be referred to a dentist for multiple dental cavities. The patient has very poor dental hygiene and particularly complains of left lower premolar. He denies any fever any difficulty chewing, any jaw pain.   No Known Allergies Past Medical History  Diagnosis Date  . Methadone use     clinic each day  . Hepatitis C    Current Outpatient Prescriptions on File Prior to Visit  Medication Sig Dispense Refill  . alprazolam (XANAX) 2 MG tablet Take 2 mg by mouth at bedtime as needed for sleep.      . diphenhydrAMINE (BENADRYL) 25 mg capsule Take 25 mg by mouth every 6 (six) hours as needed for itching or allergies. Allergies/sinus drainage      . fexofenadine (ALLEGRA) 180 MG tablet Take 180 mg by mouth daily as needed (allergies).       No current facility-administered medications on file prior to visit.   History reviewed. No pertinent family history. History   Social History  . Marital Status: Legally Separated    Spouse Name: N/A    Number of Children: N/A  . Years of Education: N/A   Occupational History  . Not on file.   Social History Main Topics  . Smoking status: Current Every Day Smoker  . Smokeless tobacco: Not on file  . Alcohol Use: No  . Drug Use: Yes    Special: Marijuana     Comment: heroin use  . Sexual Activity: Not on file   Other Topics Concern  . Not on file   Social History Narrative  . No narrative on file    Review of Systems  Constitutional: Negative for fever, chills, diaphoresis, activity change, appetite change and fatigue.  HENT: As in history of present illness Eyes: Negative for pain, discharge, redness, itching and visual disturbance.  Respiratory: Negative for cough, choking, chest tightness, shortness of breath, wheezing and stridor.   Cardiovascular: Negative for chest pain, palpitations and leg swelling.   Gastrointestinal: Negative for abdominal distention.  Genitourinary: Negative for dysuria, urgency, frequency, hematuria, flank pain, decreased urine volume, difficulty urinating and dyspareunia.  Musculoskeletal: Negative for back pain, joint swelling, arthralgias and gait problem.  Neurological: Negative for dizziness, tremors, seizures, syncope, facial asymmetry, speech difficulty, weakness, light-headedness, numbness and headaches.  Hematological: Negative for adenopathy. Does not bruise/bleed easily.  Psychiatric/Behavioral: Negative for hallucinations, behavioral problems, confusion, dysphoric mood, decreased concentration and agitation.    Objective:   Filed Vitals:   04/17/13 1525  BP: 116/74  Pulse: 69  Temp: 98.7 F (37.1 C)  Resp: 17    Physical Exam  Constitutional: Appears well-developed and well-nourished. No distress.  HENT: Normocephalic. External right and left ear normal. Oropharynx is clear and moist.  Eyes: Conjunctivae and EOM are normal. PERRLA, no scleral icterus.  Neck: Normal ROM. Neck supple. No JVD. No tracheal deviation. No thyromegaly.  CVS: RRR, S1/S2 +, no murmurs, no gallops, no carotid bruit.  Pulmonary: Effort and breath sounds normal, no stridor, rhonchi, wheezes, rales.  Abdominal: Soft. BS +,  no distension, tenderness, rebound or guarding.  Musculoskeletal: Normal range of motion. No edema and no tenderness.  Lymphadenopathy: No lymphadenopathy noted, cervical, inguinal. Neuro: Alert. Normal reflexes, muscle tone coordination. No cranial nerve deficit. Skin: Skin is warm and dry. No rash noted. Not diaphoretic. No erythema. No pallor.  Psychiatric: Normal mood  and affect. Behavior, judgment, thought content normal.   Lab Results  Component Value Date   WBC 13.1* 09/07/2011   HGB 17.6* 09/07/2011   HCT 51.5 09/07/2011   MCV 88.3 09/07/2011   PLT 305 09/07/2011   Lab Results  Component Value Date   CREATININE 0.81 01/30/2013   BUN 13  01/30/2013   NA 138 01/30/2013   K 4.2 01/30/2013   CL 101 01/30/2013   CO2 27 01/30/2013    No results found for this basename: HGBA1C   Lipid Panel  No results found for this basename: chol, trig, hdl, cholhdl, vldl, ldlcalc       Assessment and plan:   Patient Active Problem List   Diagnosis Date Noted  . Hepatitis C antibody test positive 03/13/2013  . Elevated transaminase level 03/13/2013       Multiple dental cavities Patient urgently needs to see a dentist for multiple dental cavities We'll prescribe Augmentin for 10 days Ibuprofen prescribed for pain Patient will be seen in 3-4 months as needed  The patient was given clear instructions to go to ER or return to medical center if symptoms don't improve, worsen or new problems develop. The patient verbalized understanding. The patient was told to call to get any lab results if not heard anything in the next week.

## 2013-04-17 NOTE — Progress Notes (Signed)
Patient complains of having dental pain to left side of mouth Will need a referral to dentist

## 2013-05-13 ENCOUNTER — Encounter: Payer: Self-pay | Admitting: Internal Medicine

## 2013-05-13 ENCOUNTER — Ambulatory Visit (INDEPENDENT_AMBULATORY_CARE_PROVIDER_SITE_OTHER): Payer: No Typology Code available for payment source | Admitting: Internal Medicine

## 2013-05-13 VITALS — BP 116/74 | HR 73 | Temp 98.0°F | Ht 72.0 in | Wt 217.0 lb

## 2013-05-13 DIAGNOSIS — R894 Abnormal immunological findings in specimens from other organs, systems and tissues: Secondary | ICD-10-CM

## 2013-05-13 DIAGNOSIS — R768 Other specified abnormal immunological findings in serum: Secondary | ICD-10-CM

## 2013-05-13 NOTE — Progress Notes (Signed)
Patient ID: Victor Gomez, male   DOB: May 06, 1975, 38 y.o.   MRN: [id number] +Victor Gomez is a 38 y.o. male who presents for initial evaluation and management of a positive Hepatitis C antibody test.  Patient tested positive 2 years ago. Test was performed as part of an evaluation of risk factors. Hepatitis C risk factors present are: IV drug abuse (details: last used 1 year ago, first started 4 years ago). Patient denies accidental needle stick, multiple sexual partners, tattoos. Patient has not had other studies performed. Results: none. Patient has not had prior treatment for Hepatitis C. Patient does not have a past history of liver disease. Patient does not have a family history of liver disease.   HPI: Unfortunately, does not have insurance.  Looking for a job.  Unable to afford labs.  Does not drink alcohol.  Smokes.    Patient does not have documented immunity to Hepatitis A. Patient does not have documented immunity to Hepatitis B.     Review of Systems A comprehensive review of systems was negative.   Past Medical History  Diagnosis Date  . Methadone use     clinic each day  . Hepatitis C     History  Substance Use Topics  . Smoking status: Current Every Day Smoker -- 0.50 packs/day    Types: Cigarettes  . Smokeless tobacco: Never Used  . Alcohol Use: No    No family history on file. CAD in paternal GF, hypercholesterol in father   Objective:   Filed Vitals:   05/13/13 0926  BP: 116/74  Pulse: 73  Temp: 98 F (36.7 C)   in no apparent distress, well developed and well nourished, alert and oriented times 3 HEENT: anicteric Cor RRR and No murmurs clear Bowel sounds are normal, liver is not enlarged, spleen is not enlarged peripheral pulses normal, no pedal edema, no clubbing or cyanosis negative for - jaundice, spider hemangioma, telangiectasia, palmar erythema, ecchymosis and atrophy  Laboratory Genotype:  No results found for this basename: hcvgenotype    HCV viral load:  Lab Results  Component Value Date   WBC 13.1* 09/07/2011   HGB 17.6* 09/07/2011   HCT 51.5 09/07/2011   MCV 88.3 09/07/2011   PLT 305 09/07/2011    Lab Results  Component Value Date   CREATININE 0.81 01/30/2013   BUN 13 01/30/2013   NA 138 01/30/2013   K 4.2 01/30/2013   CL 101 01/30/2013   CO2 27 01/30/2013    Lab Results  Component Value Date   ALT 58* 01/30/2013   AST 62* 01/30/2013   ALKPHOS 78 01/30/2013   BILITOT 0.6 01/30/2013      Assessment: Hepatitis C genotype 1  Plan: 1) Patient counseled extensively on limiting acetaminophen to no more than 2 grams daily, avoidance of alcohol. 2) Transmission discussed with patient including sexual transmission, sharing razors and toothbrush.   3) Will need referral to gastroenterology: N 4) Will need referral for substance abuse counseling: Y - when able to consider treatment Will need hepatitis A and B vaccine when he has coverage.  Will defer to PCP at this time.    Unfortuantely, no coverage for labs or process established for patient assistance.  He will notify us if he gets insurance coverage.  I discussed options including work and [employer], [employer].   Follow up in 1 year.

## 2013-08-28 ENCOUNTER — Encounter: Payer: Self-pay | Admitting: Internal Medicine

## 2013-08-28 ENCOUNTER — Ambulatory Visit: Payer: No Typology Code available for payment source | Attending: Internal Medicine | Admitting: Internal Medicine

## 2013-08-28 VITALS — BP 122/80 | HR 70 | Temp 98.2°F | Resp 16 | Wt 205.0 lb

## 2013-08-28 DIAGNOSIS — Z23 Encounter for immunization: Secondary | ICD-10-CM

## 2013-08-28 DIAGNOSIS — R894 Abnormal immunological findings in specimens from other organs, systems and tissues: Secondary | ICD-10-CM

## 2013-08-28 DIAGNOSIS — F17200 Nicotine dependence, unspecified, uncomplicated: Secondary | ICD-10-CM | POA: Insufficient documentation

## 2013-08-28 DIAGNOSIS — R768 Other specified abnormal immunological findings in serum: Secondary | ICD-10-CM

## 2013-08-28 DIAGNOSIS — B1920 Unspecified viral hepatitis C without hepatic coma: Secondary | ICD-10-CM | POA: Insufficient documentation

## 2013-08-28 NOTE — Progress Notes (Signed)
Patient here for follow up on his Hepatitis Has form he needs filled out for  Medication assitance

## 2013-08-28 NOTE — Progress Notes (Signed)
MRN: [id number] Name: Victor Gomez  Sex: male Age: 38 y.o. DOB: 04-09-1975  Allergies: Review of patient's allergies indicates no known allergies.  Chief Complaint  Patient presents with  . Follow-up    hep c    HPI: Patient is 38 y.o. male who has diagnosis of hepatitis C patient was referred to infectious disease and was evaluated 3-4 months ago since patient has orange card he could not her off for the labs and medication, patient is trying to look for sources how he can get the treatment for hepatitis C, patient is going to follow with our social worker for further assistance, as per patient he only one time got hepatitis B vaccination shot which was last here in April, he had a blood work done in December at that time his serology showed that he is not immune to hepatitis B.  Past Medical History  Diagnosis Date  . Methadone use     clinic each day  . Hepatitis C     Past Surgical History  Procedure Laterality Date  . Hernia repair        Medication List       This list is accurate as of: 08/28/13 11:41 AM.  Always use your most recent med list.               diphenhydrAMINE 25 mg capsule  Commonly known as:  BENADRYL  Take 25 mg by mouth every 6 (six) hours as needed for itching or allergies. Allergies/sinus drainage     ibuprofen 600 MG tablet  Commonly known as:  ADVIL,MOTRIN  Take 1 tablet (600 mg total) by mouth every 8 (eight) hours as needed.        No orders of the defined types were placed in this encounter.    Immunization History  Administered Date(s) Administered  . Hepatitis B, adult/adol-2 dose 08/28/2013    History reviewed. No pertinent family history.  History  Substance Use Topics  . Smoking status: Current Every Day Smoker -- 0.50 packs/day    Types: Cigarettes  . Smokeless tobacco: Never Used  . Alcohol Use: No    Review of Systems   As noted in HPI  Filed Vitals:   08/28/13 1008  BP: 122/80  Pulse: 70  Temp:  98.2 F (36.8 C)  Resp: 16    Physical Exam  Physical Exam  Constitutional: No distress.  Eyes: EOM are normal. Pupils are equal, round, and reactive to light.  Cardiovascular: Normal rate and regular rhythm.   Pulmonary/Chest: Breath sounds normal. No respiratory distress. He has no wheezes. He has no rales.    CBC    Component Value Date/Time   WBC 13.1* 09/07/2011 1950   RBC 5.83* 09/07/2011 1950   HGB 17.6* 09/07/2011 1950   HCT 51.5 09/07/2011 1950   PLT 305 09/07/2011 1950   MCV 88.3 09/07/2011 1950   LYMPHSABS 2.8 04/08/2010 1443   MONOABS 0.9 04/08/2010 1443   EOSABS 0.2 04/08/2010 1443   BASOSABS 0.0 04/08/2010 1443    CMP     Component Value Date/Time   NA 138 01/30/2013 1716   K 4.2 01/30/2013 1716   CL 101 01/30/2013 1716   CO2 27 01/30/2013 1716   GLUCOSE 104* 01/30/2013 1716   BUN 13 01/30/2013 1716   CREATININE 0.81 01/30/2013 1716   CREATININE 0.70 09/07/2011 1950   CALCIUM 9.5 01/30/2013 1716   PROT 7.4 01/30/2013 1716   ALBUMIN 4.6 01/30/2013 1716  AST 62* 01/30/2013 1716   ALT 58* 01/30/2013 1716   ALKPHOS 78 01/30/2013 1716   BILITOT 0.6 01/30/2013 1716   GFRNONAA >90 09/07/2011 1950   GFRAA >90 09/07/2011 1950    No results found for this basename: chol, tri, ldl    No components found with this basename: hga1c    Lab Results  Component Value Date/Time   AST 62* 01/30/2013  5:16 PM    Assessment and Plan  Hepatitis C antibody test positive Patient is going to see a Child psychotherapist for further help and assistance how he can get the treatment.  Need for hepatitis B vaccination Patient has not immune to hepatitis B, he has started vaccination series today.  [name], MD

## 2013-09-29 IMAGING — CR DG ELBOW COMPLETE 3+V*L*
4 series · 4 of 4 positions shown · non-contrast
Comparison: None.

CLINICAL DATA: Injury

LEFT ELBOW - COMPLETE 3+ VIEW

[x elbow ap left]
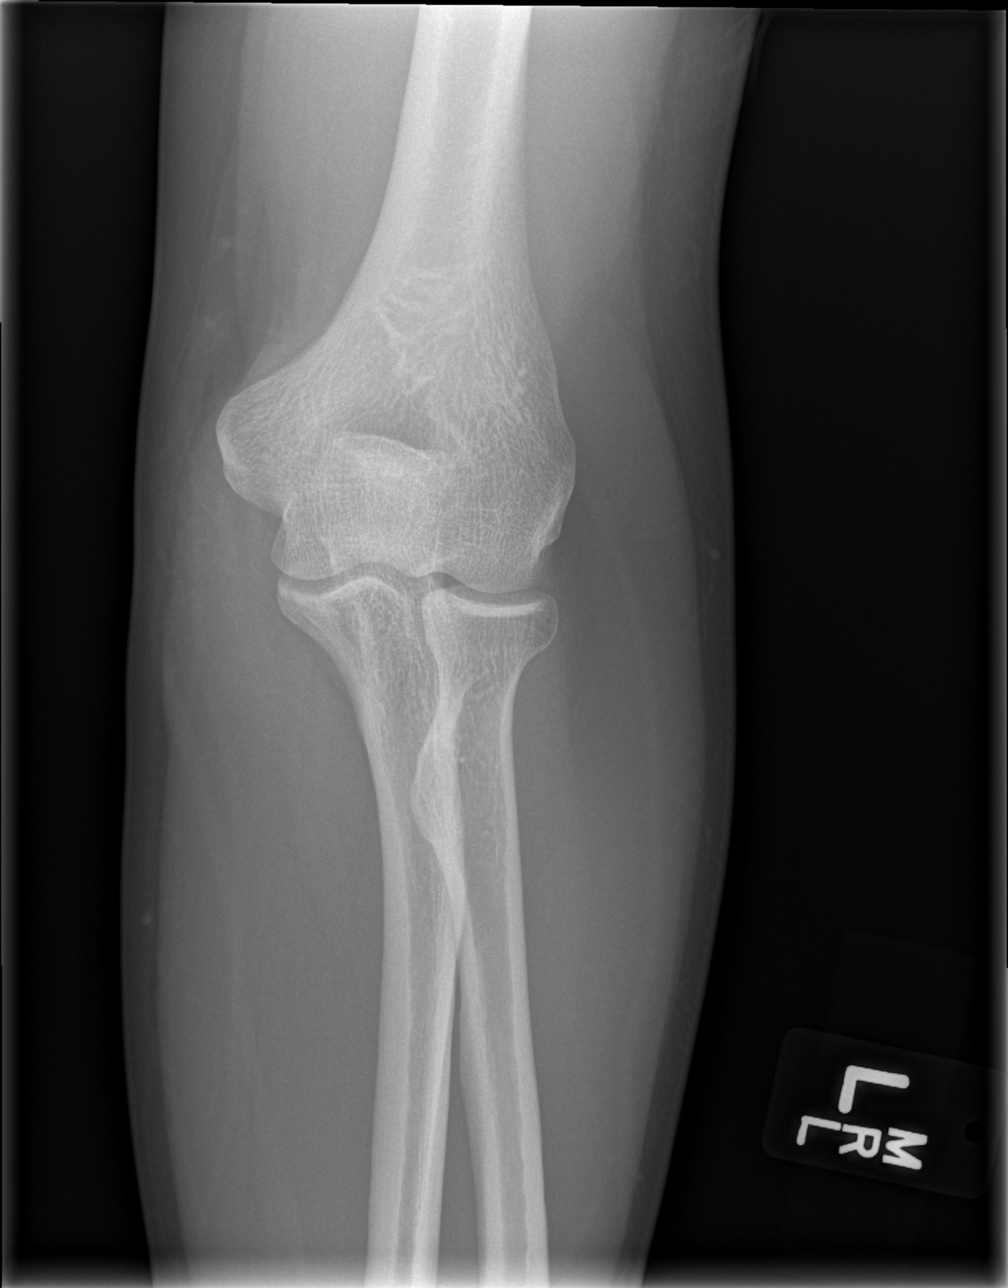

[x elbow obl left (1 of 2)]
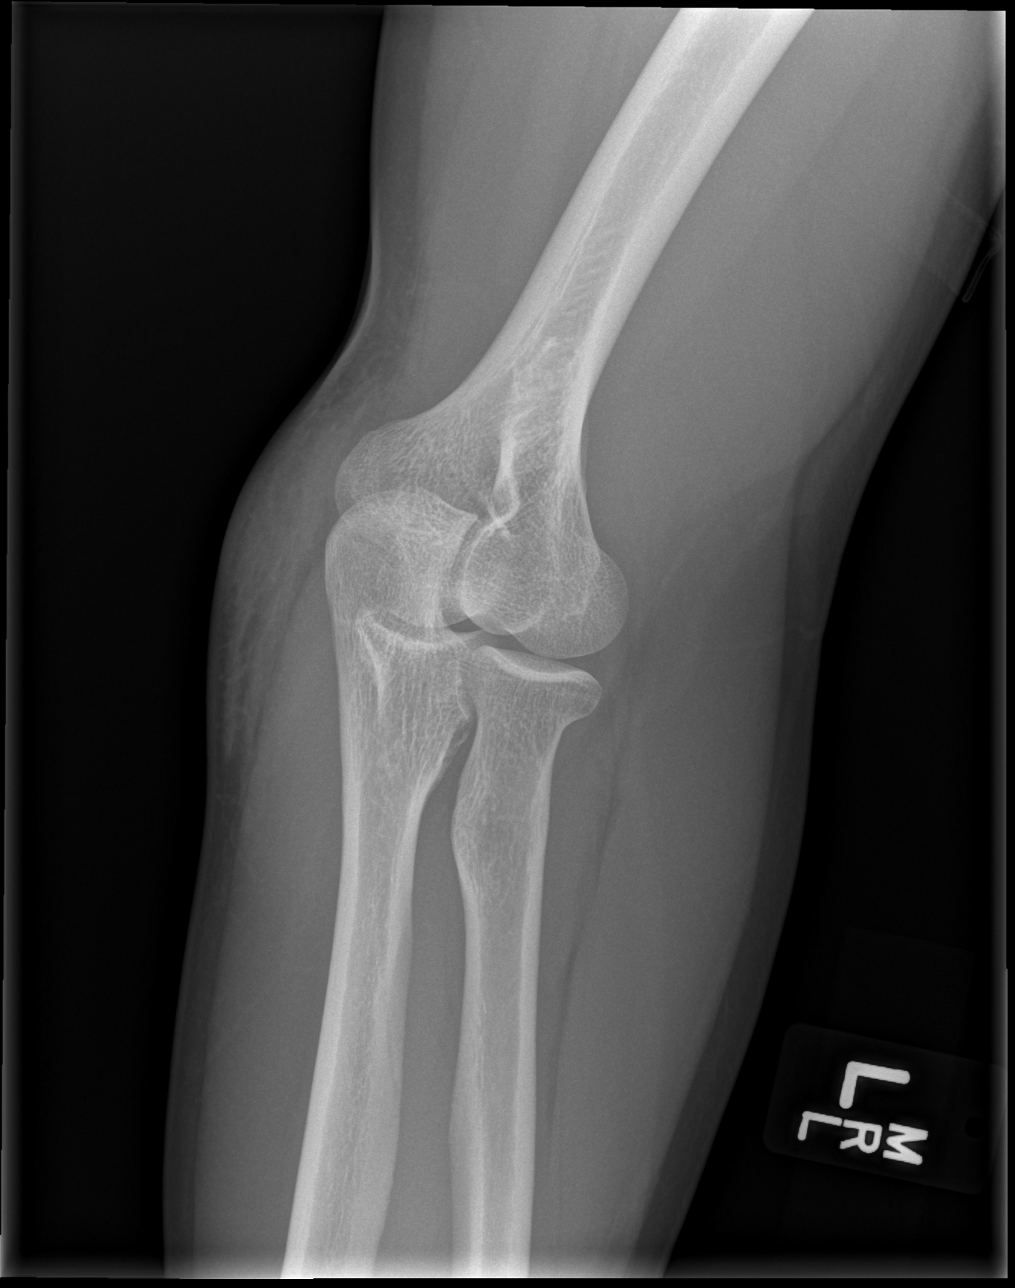

[x elbow obl left (2 of 2)]
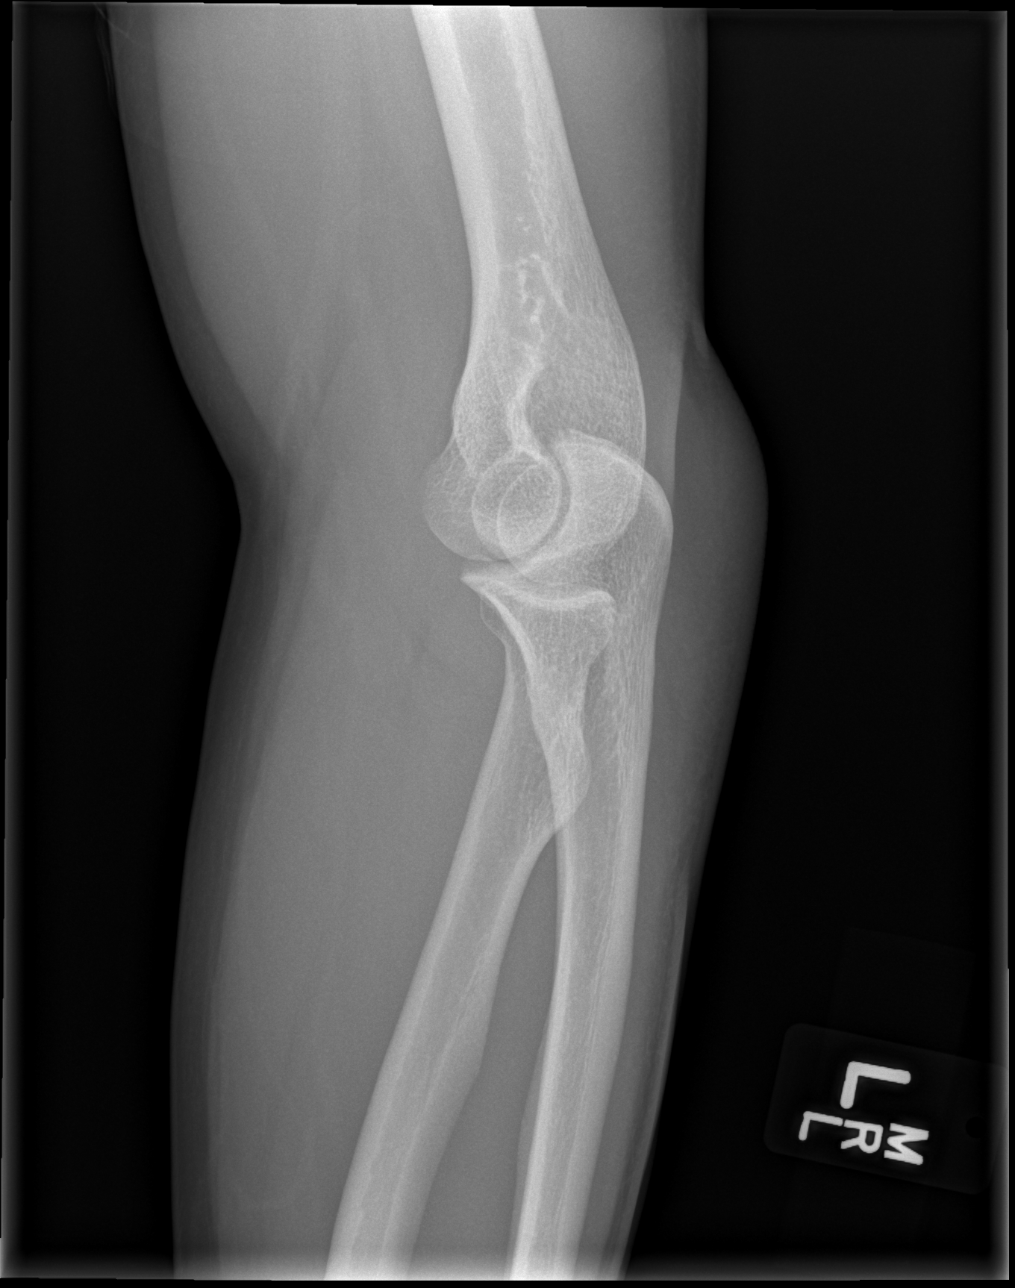

[x elbow lat left]
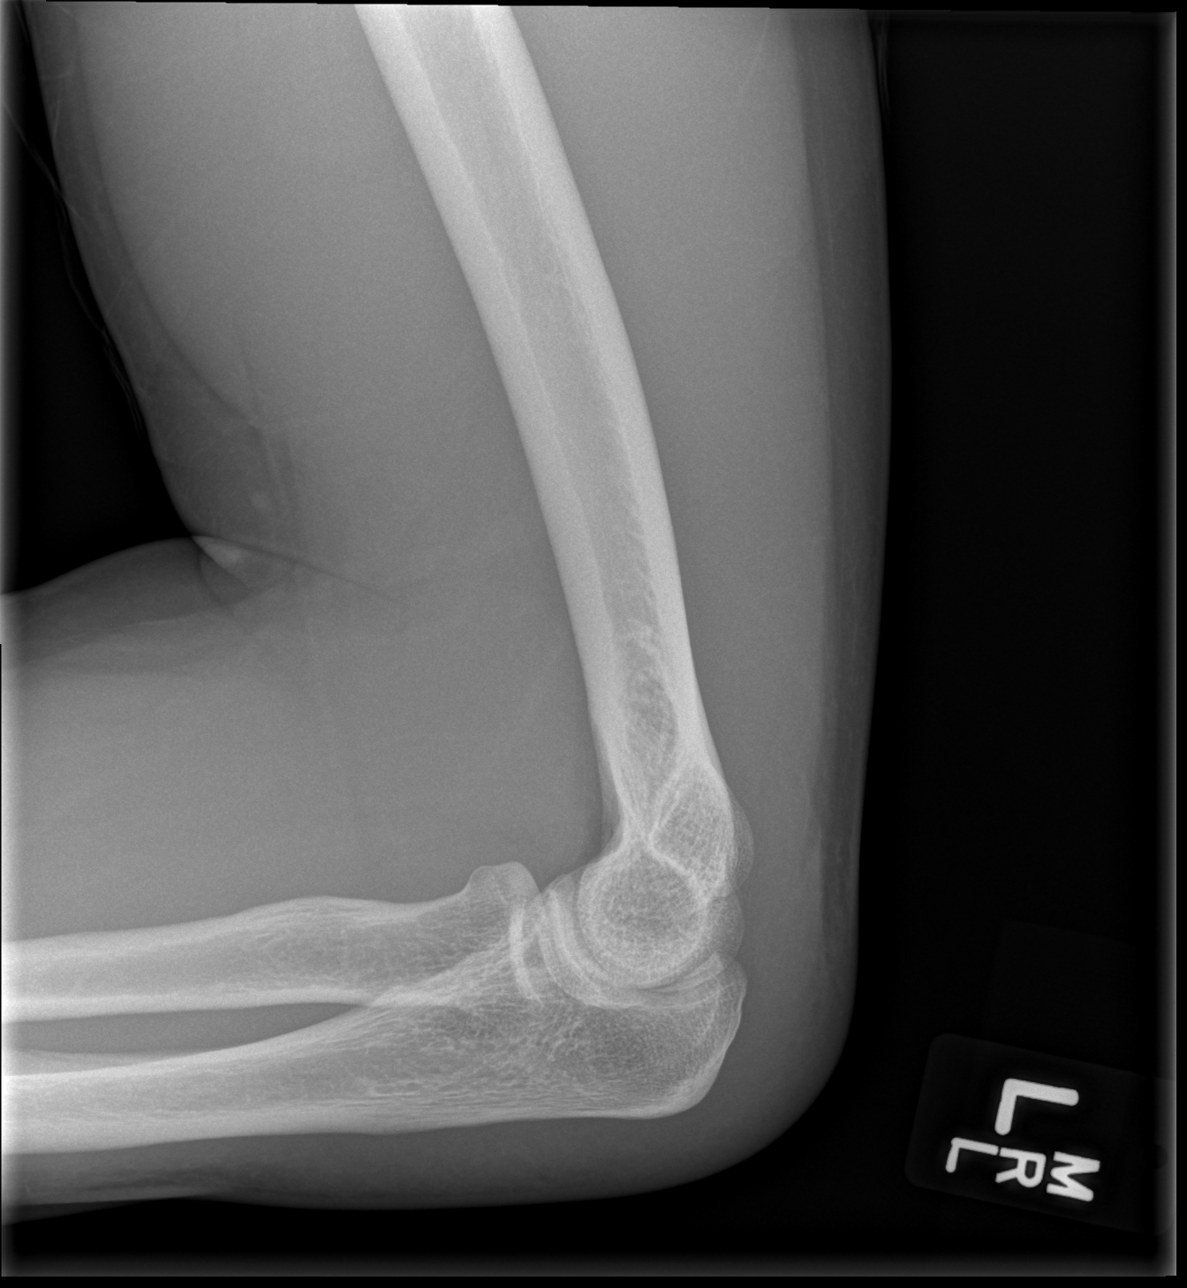

[4 of 4 positions shown; findings below may reference images not displayed]

FINDINGS: There is no evidence of fracture, dislocation, or joint
effusion.  There is no evidence of arthropathy or other focal bone
abnormality.  Soft tissues are unremarkable.
IMPRESSION: Negative.

## 2013-10-02 ENCOUNTER — Ambulatory Visit: Payer: No Typology Code available for payment source

## 2013-10-02 ENCOUNTER — Other Ambulatory Visit: Payer: No Typology Code available for payment source

## 2013-12-05 ENCOUNTER — Emergency Department (HOSPITAL_COMMUNITY)
Admission: EM | Admit: 2013-12-05 | Discharge: 2013-12-06 | Disposition: A | Discharge status: Home / Self Care | Payer: No Typology Code available for payment source | Attending: Emergency Medicine | Admitting: Emergency Medicine

## 2013-12-05 ENCOUNTER — Encounter (HOSPITAL_COMMUNITY): Payer: Self-pay | Admitting: Emergency Medicine

## 2013-12-05 DIAGNOSIS — Z79899 Other long term (current) drug therapy: Secondary | ICD-10-CM | POA: Insufficient documentation

## 2013-12-05 DIAGNOSIS — F22 Delusional disorders: Secondary | ICD-10-CM

## 2013-12-05 DIAGNOSIS — F1120 Opioid dependence, uncomplicated: Secondary | ICD-10-CM | POA: Diagnosis present

## 2013-12-05 DIAGNOSIS — F419 Anxiety disorder, unspecified: Secondary | ICD-10-CM | POA: Insufficient documentation

## 2013-12-05 DIAGNOSIS — R300 Dysuria: Secondary | ICD-10-CM | POA: Insufficient documentation

## 2013-12-05 DIAGNOSIS — F11229 Opioid dependence with intoxication, unspecified: Secondary | ICD-10-CM

## 2013-12-05 DIAGNOSIS — Z720 Tobacco use: Secondary | ICD-10-CM | POA: Insufficient documentation

## 2013-12-05 DIAGNOSIS — F1410 Cocaine abuse, uncomplicated: Secondary | ICD-10-CM | POA: Diagnosis present

## 2013-12-05 DIAGNOSIS — F1110 Opioid abuse, uncomplicated: Secondary | ICD-10-CM | POA: Insufficient documentation

## 2013-12-05 DIAGNOSIS — F1920 Other psychoactive substance dependence, uncomplicated: Secondary | ICD-10-CM

## 2013-12-05 DIAGNOSIS — Z8619 Personal history of other infectious and parasitic diseases: Secondary | ICD-10-CM | POA: Insufficient documentation

## 2013-12-05 DIAGNOSIS — F1210 Cannabis abuse, uncomplicated: Secondary | ICD-10-CM | POA: Insufficient documentation

## 2013-12-05 LAB — COMPREHENSIVE METABOLIC PANEL
ALT: 20 U/L (ref 0–53)
AST: 33 U/L (ref 0–37)
Albumin: 3.8 g/dL (ref 3.5–5.2)
Alkaline Phosphatase: 88 U/L (ref 39–117)
Anion gap: 13 (ref 5–15)
BUN: 10 mg/dL (ref 6–23)
CO2: 25 mEq/L (ref 19–32)
Calcium: 9.7 mg/dL (ref 8.4–10.5)
Chloride: 98 mEq/L (ref 96–112)
Creatinine, Ser: 0.73 mg/dL (ref 0.50–1.35)
GFR calc Af Amer: >90 mL/min (ref >90)
GFR calc non Af Amer: >90 mL/min (ref >90)
Glucose, Bld: 115 mg/dL — ABNORMAL HIGH (ref 70–99)
Potassium: 4 mEq/L (ref 3.7–5.3)
Sodium: 136 mEq/L — ABNORMAL LOW (ref 137–147)
Total Bilirubin: 0.8 mg/dL (ref 0.3–1.2)
Total Protein: 8.1 g/dL (ref 6.0–8.3)

## 2013-12-05 LAB — CBC WITH DIFFERENTIAL/PLATELET
Basophils Absolute: 0 10*3/uL (ref 0.0–0.1)
Basophils Relative: 0 % (ref 0–1)
Eosinophils Absolute: 0.2 10*3/uL (ref 0.0–0.7)
Eosinophils Relative: 1 % (ref 0–5)
HCT: 44.8 % (ref 39.0–52.0)
Hemoglobin: 15.2 g/dL (ref 13.0–17.0)
Lymphocytes Relative: 16 % (ref 12–46)
Lymphs Abs: 2.5 10*3/uL (ref 0.7–4.0)
MCH: 29.2 pg (ref 26.0–34.0)
MCHC: 33.9 g/dL (ref 30.0–36.0)
MCV: 86.2 fL (ref 78.0–100.0)
Monocytes Absolute: 1.7 10*3/uL — ABNORMAL HIGH (ref 0.1–1.0)
Monocytes Relative: 11 % (ref 3–12)
Neutro Abs: 11.4 10*3/uL — ABNORMAL HIGH (ref 1.7–7.7)
Neutrophils Relative %: 72 % (ref 43–77)
Platelets: 303 10*3/uL (ref 150–400)
RBC: 5.2 MIL/uL (ref 4.22–5.81)
RDW: 13.3 % (ref 11.5–15.5)
WBC: 15.8 10*3/uL — ABNORMAL HIGH (ref 4.0–10.5)

## 2013-12-05 LAB — RAPID URINE DRUG SCREEN, HOSP PERFORMED
Amphetamines: NOT DETECTED
Barbiturates: NOT DETECTED
Benzodiazepines: NOT DETECTED
Cocaine: POSITIVE — AB
Opiates: POSITIVE — AB
Tetrahydrocannabinol: POSITIVE — AB

## 2013-12-05 LAB — URINALYSIS, ROUTINE W REFLEX MICROSCOPIC
Glucose, UA: NEGATIVE mg/dL
Ketones, ur: NEGATIVE mg/dL
Nitrite: NEGATIVE
Protein, ur: 30 mg/dL — AB
Specific Gravity, Urine: 1.022 (ref 1.005–1.030)
Urobilinogen, UA: 0.2 mg/dL (ref 0.0–1.0)
pH: 6 (ref 5.0–8.0)

## 2013-12-05 LAB — ACETAMINOPHEN LEVEL: Acetaminophen (Tylenol), Serum: <15 ug/mL (ref 10–30)

## 2013-12-05 LAB — ETHANOL: Alcohol, Ethyl (B): <11 mg/dL (ref 0–11)

## 2013-12-05 LAB — URINE MICROSCOPIC-ADD ON

## 2013-12-05 LAB — SALICYLATE LEVEL: Salicylate Lvl: <2 mg/dL — ABNORMAL LOW (ref 2.8–20.0)

## 2013-12-05 MED ORDER — LORAZEPAM 1 MG PO TABS
1.0000 mg | ORAL_TABLET | Freq: Once | ORAL | Status: AC
  Administered 2013-12-05: 1 mg via ORAL
  Filled 2013-12-05: qty 1

## 2013-12-05 MED ORDER — IBUPROFEN 200 MG PO TABS: 600.0000 mg | ORAL_TABLET | Freq: Three times a day (TID) | ORAL | Status: DC | PRN

## 2013-12-05 MED ORDER — CEPHALEXIN 500 MG PO CAPS
500.0000 mg | ORAL_CAPSULE | Freq: Two times a day (BID) | ORAL | Status: DC
  Administered 2013-12-05 – 2013-12-06 (×3): 500 mg via ORAL
  Filled 2013-12-05 (×3): qty 1

## 2013-12-05 MED ORDER — CEFTRIAXONE SODIUM 1 G IJ SOLR
1.0000 g | Freq: Once | INTRAMUSCULAR | Status: AC
  Administered 2013-12-05: 1 g via INTRAMUSCULAR
  Filled 2013-12-05: qty 10

## 2013-12-05 MED ORDER — ACETAMINOPHEN 325 MG PO TABS
650.0000 mg | ORAL_TABLET | ORAL | Status: DC | PRN
  Administered 2013-12-06: 650 mg via ORAL
  Filled 2013-12-05: qty 2

## 2013-12-05 MED ORDER — LORAZEPAM 1 MG PO TABS: 1.0000 mg | ORAL_TABLET | Freq: Three times a day (TID) | ORAL | Status: DC | PRN

## 2013-12-05 MED ORDER — ZOLPIDEM TARTRATE 5 MG PO TABS
5.0000 mg | ORAL_TABLET | Freq: Every evening | ORAL | Status: DC | PRN
  Administered 2013-12-05: 5 mg via ORAL
  Filled 2013-12-05: qty 1

## 2013-12-05 NOTE — Progress Notes (Signed)
Patient was asleep and awakened for assessment.  However, he would not respond and went back to sleep.  His RN stated from the cameras that he had been masturbating earlier.  He will be reassessed during rounds in the am.  Nanine MeansJamison Anira [place], PMH-NP
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
## 2013-12-05 NOTE — ED Notes (Signed)
Patient arrived to unit. Pt unable to stand still and is constantly fidgeting and rocking. Pt pleasant and apologetic for movements. Pt states he is tired of his addictions and wants to make his grandfather, who recently passed away, proud of him by getting clean. Pt tearful while speaking of his passing. Pt denies SI or plans to harm himself. Pt disheveled and with strong body odor. Pt supplied with new scrubs, towels and toiletries and was directed to take a shower to help him feel more relaxed. Pt compliant and thanking staff for assistance. No s/s of distress noted at this time.

## 2013-12-05 NOTE — ED Notes (Signed)
Pt now has IVC papers 

## 2013-12-05 NOTE — ED Notes (Addendum)
Per patient, states he did Meth a week ago and now he is paranoid and hasnt slept-states he has ad a lot of losses-states painful urination-last used heroin 2 weeks ago-took a suboxon this am

## 2013-12-06 ENCOUNTER — Encounter (HOSPITAL_COMMUNITY): Payer: Self-pay | Admitting: Psychiatry

## 2013-12-06 DIAGNOSIS — F1120 Opioid dependence, uncomplicated: Secondary | ICD-10-CM | POA: Diagnosis present

## 2013-12-06 DIAGNOSIS — F1190 Opioid use, unspecified, uncomplicated: Secondary | ICD-10-CM

## 2013-12-06 DIAGNOSIS — F1410 Cocaine abuse, uncomplicated: Secondary | ICD-10-CM | POA: Diagnosis present

## 2013-12-06 DIAGNOSIS — F1920 Other psychoactive substance dependence, uncomplicated: Secondary | ICD-10-CM

## 2013-12-06 LAB — URINE CULTURE: Colony Count: 75000

## 2013-12-06 MED ORDER — CEPHALEXIN 500 MG PO CAPS: 500.0000 mg | ORAL_CAPSULE | Freq: Two times a day (BID) | ORAL | Status: DC

## 2013-12-06 NOTE — ED Notes (Signed)
Pt denies any si/hi/av. He is not having any type of withdrawal symptoms. He will go home with his mothe and she will transport him. He  was given a list of f/u facilities. He signed for his belongings. Pt will escorted to the lobby and his ivc was rescinded.

## 2013-12-06 NOTE — ED Notes (Signed)
Patient restless,pacing around in room

## 2013-12-06 NOTE — Consult Note (Signed)
[facility] Face-to-Face Psychiatry Consult   Reason for Consult:  Polysubstance dependence Referring Physician:  EDP  Victor Gomez is an 38 y.o. male. Total Time spent with patient: 45 minutes  Assessment: AXIS I:  Polysubstance dependency, including opioids, continuous use AXIS II:  Deferred AXIS III:   Past Medical History  Diagnosis Date  . Methadone use     clinic each day  . Hepatitis C    AXIS IV:  other psychosocial or environmental problems, problems related to social environment and problems with primary support group AXIS V:  61-70 mild symptoms  Plan:  No evidence of imminent risk to self or others at present.  Dr. Adele Schilder assessed the patient and concurs with the plan.  Subjective:   Victor Gomez is a 38 y.o. male patient does not warrant admission.  HPI:  The patient stated he used methadone for the first time last week and has not been able to sleep until last night.  He denies paranoia and destroying property along with denial of suicidal/homicidal ideations, hallucinations.  Mann states he lives with his mother and she wants him to go to a 2 year drug rehab/program but he does not want to because he has a 54 daughter in town.  He stopped using heroin last week and started using Suboxone "off the streets" instead.  He does not want any treatment or detox at this time. HPI Elements:   Location:  generalized. Quality:  chronic. Severity:  mild. Timing:  intermittent. Duration:  years. Context:  stressors.  Past Psychiatric History: Past Medical History  Diagnosis Date  . Methadone use     clinic each day  . Hepatitis C     reports that he has been smoking Cigarettes.  He has been smoking about 0.50 packs per day. He has never used smokeless tobacco. He reports that he drinks alcohol. He reports that he uses illicit drugs (Marijuana). History reviewed. No pertinent family history.         Allergies:  No Known Allergies  ACT Assessment Complete:  Yes:     Educational Status    Risk to Self: Risk to self with the past 6 months Is patient at risk for suicide?: No Substance abuse history and/or treatment for substance abuse?: Yes  Risk to Others:    Abuse:    Prior Inpatient Therapy:    Prior Outpatient Therapy:    Additional Information:                    Objective: Blood pressure 111/71, pulse 100, temperature 98.8 F (37.1 C), temperature source Oral, resp. rate 12, SpO2 95.00%.There is no weight on file to calculate BMI. Results for orders placed during the hospital encounter of 12/05/13 (from the past 72 hour(s))  URINALYSIS, ROUTINE W REFLEX MICROSCOPIC     Status: Abnormal   Collection Time    12/05/13  3:04 PM      Result Value Ref Range   Color, Urine AMBER (*) YELLOW   Comment: BIOCHEMICALS MAY BE AFFECTED BY COLOR   APPearance CLOUDY (*) CLEAR   Specific Gravity, Urine 1.022  1.005 - 1.030   pH 6.0  5.0 - 8.0   Glucose, UA NEGATIVE  NEGATIVE mg/dL   Hgb urine dipstick LARGE (*) NEGATIVE   Bilirubin Urine SMALL (*) NEGATIVE   Ketones, ur NEGATIVE  NEGATIVE mg/dL   Protein, ur 30 (*) NEGATIVE mg/dL   Urobilinogen, UA 0.2  0.0 - 1.0 mg/dL  Nitrite NEGATIVE  NEGATIVE   Leukocytes, UA MODERATE (*) NEGATIVE  URINE RAPID DRUG SCREEN (HOSP PERFORMED)     Status: Abnormal   Collection Time    12/05/13  3:04 PM      Result Value Ref Range   Opiates POSITIVE (*) NONE DETECTED   Cocaine POSITIVE (*) NONE DETECTED   Benzodiazepines NONE DETECTED  NONE DETECTED   Amphetamines NONE DETECTED  NONE DETECTED   Tetrahydrocannabinol POSITIVE (*) NONE DETECTED   Barbiturates NONE DETECTED  NONE DETECTED   Comment:            DRUG SCREEN FOR MEDICAL PURPOSES     ONLY.  IF CONFIRMATION IS NEEDED     FOR ANY PURPOSE, NOTIFY LAB     WITHIN 5 DAYS.                LOWEST DETECTABLE LIMITS     FOR URINE DRUG SCREEN     Drug Class       Cutoff (ng/mL)     Amphetamine      1000     Barbiturate      200      Benzodiazepine   973     Tricyclics       532     Opiates          300     Cocaine          300     THC              50  URINE MICROSCOPIC-ADD ON     Status: None   Collection Time    12/05/13  3:04 PM      Result Value Ref Range   WBC, UA 11-20  <3 WBC/hpf   RBC / HPF 21-50  <3 RBC/hpf   Urine-Other MUCOUS PRESENT    CBC WITH DIFFERENTIAL     Status: Abnormal   Collection Time    12/05/13  4:00 PM      Result Value Ref Range   WBC 15.8 (*) 4.0 - 10.5 K/uL   RBC 5.20  4.22 - 5.81 MIL/uL   Hemoglobin 15.2  13.0 - 17.0 g/dL   HCT 44.8  39.0 - 52.0 %   MCV 86.2  78.0 - 100.0 fL   MCH 29.2  26.0 - 34.0 pg   MCHC 33.9  30.0 - 36.0 g/dL   RDW 13.3  11.5 - 15.5 %   Platelets 303  150 - 400 K/uL   Neutrophils Relative % 72  43 - 77 %   Neutro Abs 11.4 (*) 1.7 - 7.7 K/uL   Lymphocytes Relative 16  12 - 46 %   Lymphs Abs 2.5  0.7 - 4.0 K/uL   Monocytes Relative 11  3 - 12 %   Monocytes Absolute 1.7 (*) 0.1 - 1.0 K/uL   Eosinophils Relative 1  0 - 5 %   Eosinophils Absolute 0.2  0.0 - 0.7 K/uL   Basophils Relative 0  0 - 1 %   Basophils Absolute 0.0  0.0 - 0.1 K/uL  COMPREHENSIVE METABOLIC PANEL     Status: Abnormal   Collection Time    12/05/13  4:00 PM      Result Value Ref Range   Sodium 136 (*) 137 - 147 mEq/L   Potassium 4.0  3.7 - 5.3 mEq/L   Chloride 98  96 - 112 mEq/L   CO2 25  19 - 32 mEq/L  Glucose, Bld 115 (*) 70 - 99 mg/dL   BUN 10  6 - 23 mg/dL   Creatinine, Ser 0.73  0.50 - 1.35 mg/dL   Calcium 9.7  8.4 - 10.5 mg/dL   Total Protein 8.1  6.0 - 8.3 g/dL   Albumin 3.8  3.5 - 5.2 g/dL   AST 33  0 - 37 U/L   ALT 20  0 - 53 U/L   Alkaline Phosphatase 88  39 - 117 U/L   Total Bilirubin 0.8  0.3 - 1.2 mg/dL   GFR calc non Af Amer >90  >90 mL/min   GFR calc Af Amer >90  >90 mL/min   Comment: (NOTE)     The eGFR has been calculated using the CKD EPI equation.     This calculation has not been validated in all clinical situations.     eGFR's persistently <90 mL/min  signify possible Chronic Kidney     Disease.   Anion gap 13  5 - 15  ETHANOL     Status: None   Collection Time    12/05/13  4:00 PM      Result Value Ref Range   Alcohol, Ethyl (B) <11  0 - 11 mg/dL   Comment:            LOWEST DETECTABLE LIMIT FOR     SERUM ALCOHOL IS 11 mg/dL     FOR MEDICAL PURPOSES ONLY  ACETAMINOPHEN LEVEL     Status: None   Collection Time    12/05/13  4:00 PM      Result Value Ref Range   Acetaminophen (Tylenol), Serum <15.0  10 - 30 ug/mL   Comment:            THERAPEUTIC CONCENTRATIONS VARY     SIGNIFICANTLY. A RANGE OF 10-30     ug/mL MAY BE AN EFFECTIVE     CONCENTRATION FOR MANY PATIENTS.     HOWEVER, SOME ARE BEST TREATED     AT CONCENTRATIONS OUTSIDE THIS     RANGE.     ACETAMINOPHEN CONCENTRATIONS     >150 ug/mL AT 4 HOURS AFTER     INGESTION AND >50 ug/mL AT 12     HOURS AFTER INGESTION ARE     OFTEN ASSOCIATED WITH TOXIC     REACTIONS.  SALICYLATE LEVEL     Status: Abnormal   Collection Time    12/05/13  4:00 PM      Result Value Ref Range   Salicylate Lvl <5.0 (*) 2.8 - 20.0 mg/dL   Labs are reviewed and are pertinent for no medical issues not being addressed.  Current Facility-Administered Medications  Medication Dose Route Frequency Provider Last Rate Last Dose  . acetaminophen (TYLENOL) tablet 650 mg  650 mg Oral Q4H PRN Noland Fordyce, PA-C   650 mg at 12/06/13 0727  . cephALEXin (KEFLEX) capsule 500 mg  500 mg Oral Q12H Noland Fordyce, PA-C   500 mg at 12/06/13 1013  . ibuprofen (ADVIL,MOTRIN) tablet 600 mg  600 mg Oral Q8H PRN Noland Fordyce, PA-C      . zolpidem (AMBIEN) tablet 5 mg  5 mg Oral QHS PRN Noland Fordyce, PA-C   5 mg at 12/05/13 2136   Current Outpatient Prescriptions  Medication Sig Dispense Refill  . buprenorphine-naloxone (SUBOXONE) 2-0.5 MG SUBL SL tablet Place 1 tablet under the tongue daily.      . diphenhydrAMINE (BENADRYL) 25 mg capsule Take 25 mg by mouth every 6 (six)  hours as needed for itching or  allergies. Allergies/sinus drainage      . ibuprofen (ADVIL,MOTRIN) 600 MG tablet Take 1 tablet (600 mg total) by mouth every 8 (eight) hours as needed.  30 tablet  0    Psychiatric Specialty Exam:     Blood pressure 111/71, pulse 100, temperature 98.8 F (37.1 C), temperature source Oral, resp. rate 12, SpO2 95.00%.There is no weight on file to calculate BMI.  General Appearance: Casual  Eye Contact::  Good  Speech:  Normal Rate  Volume:  Normal  Mood:  Irritable  Affect:  Congruent  Thought Process:  Coherent  Orientation:  Full (Time, Place, and Person)  Thought Content:  WDL  Suicidal Thoughts:  No  Homicidal Thoughts:  No  Memory:  Immediate;   Good Recent;   Good Remote;   Good  Judgement:  Fair  Insight:  Lacking  Psychomotor Activity:  Normal  Concentration:  Good  Recall:  Good  Fund of Knowledge:Good  Language: Good  Akathisia:  No  Handed:  Right  AIMS (if indicated):     Assets:  Housing Leisure Time Physical Health Resilience Social Support  Sleep:      Musculoskeletal: Strength & Muscle Tone: within normal limits Gait & Station: normal Patient leans: N/A  Treatment Plan Summary: Discharge home with resources for drug treatment programs and antibiotic Rx.  Waylan Boga, [place] 12/06/2013 11:44 AM  I have personally seen the patient and agreed with the findings and involved in the treatment plan. Berniece Andreas, MD

## 2013-12-06 NOTE — [facility] Suicide Risk Assessment (Signed)
Suicide Risk Assessment  Discharge Assessment     Demographic Factors:  Male, Caucasian and Unemployed  Total Time spent with patient: 45 minutes  Psychiatric Specialty Exam:     Blood pressure 111/71, pulse 100, temperature 98.8 F (37.1 C), temperature source Oral, resp. rate 12, SpO2 95.00%.There is no weight on file to calculate BMI.  General Appearance: Casual  Eye Contact::  Good  Speech:  Normal Rate  Volume:  Normal  Mood:  Irritable  Affect:  Congruent  Thought Process:  Coherent  Orientation:  Full (Time, Place, and Person)  Thought Content:  WDL  Suicidal Thoughts:  No  Homicidal Thoughts:  No  Memory:  Immediate;   Good Recent;   Good Remote;   Good  Judgement:  Fair  Insight:  Lacking  Psychomotor Activity:  Normal  Concentration:  Good  Recall:  Good  Fund of Knowledge:Good  Language: Good  Akathisia:  No  Handed:  Right  AIMS (if indicated):     Assets:  Housing Leisure Time Physical Health Resilience Social Support  Sleep:      Musculoskeletal: Strength & Muscle Tone: within normal limits Gait & Station: normal Patient leans: N/A  Mental Status Per Nursing Assessment::   On Admission:   Polysubstance dependence  Current Mental Status by Physician: NA  Loss Factors: NA  Historical Factors: NA  Risk Reduction Factors:   Responsible for children under 36 years of age, Sense of responsibility to family, Living with another person, especially a relative and Positive social support  Continued Clinical Symptoms:  Polysubstance dependence  Cognitive Features That Contribute To Risk:  None  Suicide Risk:  Minimal: No identifiable suicidal ideation.  Patients presenting with no risk factors but with morbid ruminations; may be classified as minimal risk based on the severity of the depressive symptoms  Discharge Diagnoses:   AXIS I:  Polysubstance dependence, with opioid use, continuous  AXIS II:  Deferred AXIS III:   Past Medical  History  Diagnosis Date  . Methadone use     clinic each day  . Hepatitis C    AXIS IV:  other psychosocial or environmental problems, problems related to social environment and problems with primary support group AXIS V:  61-70 mild symptoms  Plan Of Care/Follow-up recommendations:  Activity:  as tolerated Diet:  low-sodium heart healthy diet  Is patient on multiple antipsychotic therapies at discharge:  No   Has Patient had three or more failed trials of antipsychotic monotherapy by history:  No  Recommended Plan for Multiple Antipsychotic Therapies: NA    Erielle Gawronski, PMH-NP 12/06/2013, 11:50 AM

## 2013-12-06 NOTE — Discharge Instructions (Signed)
Chemical Dependency  Chemical dependency is an addiction to drugs or alcohol. It is characterized by the repeated behavior of seeking out and using drugs and alcohol despite harmful consequences to the health and safety of ones self and others.   RISK FACTORS  There are certain situations or behaviors that increase a person's risk for chemical dependency. These include:  · A family history of chemical dependency.  · A history of mental health issues, including depression and anxiety.  · A home environment where drugs and alcohol are easily available to you.  · Drug or alcohol use at a young age.  SYMPTOMS   The following symptoms can indicate chemical dependency:  · Inability to limit the use of drugs or alcohol.  · Nausea, sweating, shakiness, and anxiety that occurs when alcohol or drugs are not being used.  · An increase in amount of drugs or alcohol that is necessary to get drunk or high.  People who experience these symptoms can assess their use of drugs and alcohol by asking themselves the following questions:  · Have you been told by friends or family that they are worried about your use of alcohol or drugs?  · Do friends and family ever tell you about things you did while drinking alcohol or using drugs that you do not remember?  · Do you lie about using alcohol or drugs or about the amounts you use?  · Do you have difficulty completing daily tasks unless you use alcohol or drugs?  · Is the level of your work or school performance lower because of your drug or alcohol use?  · Do you get sick from using drugs or alcohol but keep using anyway?  · Do you feel uncomfortable in social situations unless you use alcohol or drugs?  · Do you use drugs or alcohol to help forget problems?   An answer of yes to any of these questions may indicate chemical dependency. Professional evaluation is suggested.  Document Released: 01/24/2001 Document Revised: 04/24/2011 Document Reviewed: 04/07/2010  [employer]® Patient  Information ©2015 [employer]. This information is not intended to replace advice given to you by your health care provider. Make sure you discuss any questions you have with your health care provider.

## 2013-12-08 NOTE — ED Provider Notes (Signed)
Medical screening examination/treatment/procedure(s) were performed by non-physician practitioner and as supervising physician I was immediately available for consultation/collaboration.   EKG Interpretation None       Atilla Zollner, MD 12/08/13 1255 

## 2014-05-18 ENCOUNTER — Telehealth: Payer: Self-pay

## 2014-05-19 NOTE — Telephone Encounter (Signed)
Left message

## 2014-09-10 ENCOUNTER — Encounter (HOSPITAL_COMMUNITY): Payer: Self-pay | Admitting: Emergency Medicine

## 2014-09-10 ENCOUNTER — Emergency Department (HOSPITAL_COMMUNITY)
Admission: EM | Admit: 2014-09-10 | Discharge: 2014-09-10 | Disposition: A | Discharge status: Home / Self Care | Payer: Self-pay | Attending: Emergency Medicine | Admitting: Emergency Medicine

## 2014-09-10 DIAGNOSIS — Z8619 Personal history of other infectious and parasitic diseases: Secondary | ICD-10-CM | POA: Insufficient documentation

## 2014-09-10 DIAGNOSIS — Z720 Tobacco use: Secondary | ICD-10-CM | POA: Insufficient documentation

## 2014-09-10 DIAGNOSIS — N390 Urinary tract infection, site not specified: Secondary | ICD-10-CM | POA: Insufficient documentation

## 2014-09-10 LAB — URINE MICROSCOPIC-ADD ON

## 2014-09-10 LAB — CBC WITH DIFFERENTIAL/PLATELET
Basophils Absolute: 0 10*3/uL (ref 0.0–0.1)
Basophils Relative: 0 % (ref 0–1)
Eosinophils Absolute: 0.1 10*3/uL (ref 0.0–0.7)
Eosinophils Relative: 1 % (ref 0–5)
HCT: 51.5 % (ref 39.0–52.0)
HEMOGLOBIN: 17.1 g/dL — AB (ref 13.0–17.0)
Lymphocytes Relative: 12 % (ref 12–46)
Lymphs Abs: 1.6 10*3/uL (ref 0.7–4.0)
MCH: 31.1 pg (ref 26.0–34.0)
MCHC: 33.2 g/dL (ref 30.0–36.0)
MCV: 93.6 fL (ref 78.0–100.0)
Monocytes Absolute: 1.1 10*3/uL — ABNORMAL HIGH (ref 0.1–1.0)
Monocytes Relative: 8 % (ref 3–12)
Neutro Abs: 10.1 10*3/uL — ABNORMAL HIGH (ref 1.7–7.7)
Neutrophils Relative %: 79 % — ABNORMAL HIGH (ref 43–77)
PLATELETS: 236 10*3/uL (ref 150–400)
RBC: 5.5 MIL/uL (ref 4.22–5.81)
RDW: 12.5 % (ref 11.5–15.5)
WBC: 12.8 10*3/uL — AB (ref 4.0–10.5)

## 2014-09-10 LAB — URINALYSIS, ROUTINE W REFLEX MICROSCOPIC
Glucose, UA: NEGATIVE mg/dL
KETONES UR: 40 mg/dL — AB
NITRITE: NEGATIVE
Protein, ur: 100 mg/dL — AB
Specific Gravity, Urine: 1.035 — ABNORMAL HIGH (ref 1.005–1.030)
Urobilinogen, UA: 0.2 mg/dL (ref 0.0–1.0)
pH: 5.5 (ref 5.0–8.0)

## 2014-09-10 LAB — BASIC METABOLIC PANEL
Anion gap: 10 (ref 5–15)
BUN: 13 mg/dL (ref 6–20)
CALCIUM: 9.2 mg/dL (ref 8.9–10.3)
CO2: 25 mmol/L (ref 22–32)
CREATININE: 0.84 mg/dL (ref 0.61–1.24)
Chloride: 102 mmol/L (ref 101–111)
GFR calc Af Amer: >60 mL/min (ref >60)
Glucose, Bld: 101 mg/dL — ABNORMAL HIGH (ref 65–99)
POTASSIUM: 3.7 mmol/L (ref 3.5–5.1)
SODIUM: 137 mmol/L (ref 135–145)

## 2014-09-10 MED ORDER — ONDANSETRON HCL 4 MG/2ML IJ SOLN
4.0000 mg | Freq: Once | INTRAMUSCULAR | Status: AC
  Administered 2014-09-10: 4 mg via INTRAVENOUS
  Filled 2014-09-10: qty 2

## 2014-09-10 MED ORDER — KETOROLAC TROMETHAMINE 30 MG/ML IJ SOLN
30.0000 mg | Freq: Once | INTRAMUSCULAR | Status: AC
  Administered 2014-09-10: 30 mg via INTRAVENOUS
  Filled 2014-09-10: qty 1

## 2014-09-10 MED ORDER — CEPHALEXIN 500 MG PO CAPS: 500.0000 mg | ORAL_CAPSULE | Freq: Four times a day (QID) | ORAL | Status: DC

## 2014-09-10 NOTE — Discharge Instructions (Signed)
Take Keflex as directed until gone. Follow up with your doctor. Refer to attached documents for more information.

## 2014-09-10 NOTE — ED Provider Notes (Signed)
CSN: [id number]     Arrival date & time 09/10/14  1122 History   First MD Initiated Contact with Patient 09/10/14 1129     Chief Complaint  Patient presents with  . Flank Pain     (Consider location/radiation/quality/duration/timing/severity/associated sxs/prior Treatment) HPI Comments: Patient is a 39 year old male with a past medical history of substance abuse and hepatitis C who presents with left flank pain that started this morning. The pain is located in the left flank and does not radiate. The pain is described as aching and severe. The pain started gradually and progressively worsened since the onset. No alleviating/aggravating factors. The patient has tried nothing for symptoms without relief. Associated symptoms include nothing. Patient denies fever, headache, NVD, chest pain, SOB, dysuria, constipation. Patient denies history of kidney stone.   Patient is a 39 y.o. male presenting with flank pain.  Flank Pain Pertinent negatives include no abdominal pain, arthralgias, chest pain, chills, fatigue, fever, nausea, neck pain, vomiting or weakness.    Past Medical History  Diagnosis Date  . Methadone use     clinic each day  . Hepatitis C    Past Surgical History  Procedure Laterality Date  . Hernia repair     No family history on file. History  Substance Use Topics  . Smoking status: Current Every Day Smoker -- 0.50 packs/day    Types: Cigarettes  . Smokeless tobacco: Never Used  . Alcohol Use: Yes    Review of Systems  Constitutional: Negative for fever, chills and fatigue.  HENT: Negative for trouble swallowing.   Eyes: Negative for visual disturbance.  Respiratory: Negative for shortness of breath.   Cardiovascular: Negative for chest pain and palpitations.  Gastrointestinal: Negative for nausea, vomiting, abdominal pain and diarrhea.  Genitourinary: Positive for flank pain. Negative for dysuria and difficulty urinating.  Musculoskeletal: Negative for arthralgias  and neck pain.  Skin: Negative for color change.  Neurological: Negative for dizziness and weakness.  Psychiatric/Behavioral: Negative for dysphoric mood.      Allergies  Review of patient's allergies indicates no known allergies.  Home Medications   Prior to Admission medications   Medication Sig Start Date End Date Taking? Authorizing Provider  acetaminophen (TYLENOL) 500 MG tablet Take 1,000 mg by mouth every 6 (six) hours as needed for moderate pain.   Yes Historical Provider, MD  diphenhydrAMINE (BENADRYL) 25 mg capsule Take 25 mg by mouth every 6 (six) hours as needed for itching or allergies. Allergies/sinus drainage   Yes Historical Provider, MD  cephALEXin (KEFLEX) 500 MG capsule Take 1 capsule (500 mg total) by mouth every 12 (twelve) hours. Patient not taking: Reported on 09/10/2014 12/06/13   Charm Rings, NP   BP 121/94 mmHg  Pulse 69  Temp(Src) 97.6 F (36.4 C) (Oral)  Resp 18  SpO2 100% Physical Exam  Constitutional: He is oriented to person, place, and time. He appears well-developed and well-nourished. No distress.  HENT:  Head: Normocephalic and atraumatic.  Eyes: Conjunctivae and EOM are normal.  Neck: Normal range of motion.  Cardiovascular: Normal rate and regular rhythm.  Exam reveals no gallop and no friction rub.   No murmur heard. Pulmonary/Chest: Effort normal and breath sounds normal. He has no wheezes. He has no rales. He exhibits no tenderness.  Abdominal: Soft. He exhibits no distension. There is tenderness. There is no rebound.  Generalized left abdominal tenderness to palpation. No focal tenderness.   Genitourinary:  Left CVA tenderness.   Musculoskeletal: Normal range  of motion.  Neurological: He is alert and oriented to person, place, and time. Coordination normal.  Speech is goal-oriented. Moves limbs without ataxia.   Skin: Skin is warm and dry.  Psychiatric: He has a normal mood and affect. His behavior is normal.  Nursing note and  vitals reviewed.   ED Course  Procedures (including critical care time) Labs Review Labs Reviewed  CBC WITH DIFFERENTIAL/PLATELET - Abnormal; Notable for the following:    WBC 12.8 (*)    Hemoglobin 17.1 (*)    Neutrophils Relative % 79 (*)    Neutro Abs 10.1 (*)    Monocytes Absolute 1.1 (*)    All other components within normal limits  BASIC METABOLIC PANEL - Abnormal; Notable for the following:    Glucose, Bld 101 (*)    All other components within normal limits  URINALYSIS, ROUTINE W REFLEX MICROSCOPIC (NOT AT [facility]) - Abnormal; Notable for the following:    Color, Urine AMBER (*)    APPearance CLOUDY (*)    Specific Gravity, Urine 1.035 (*)    Hgb urine dipstick LARGE (*)    Bilirubin Urine SMALL (*)    Ketones, ur 40 (*)    Protein, ur 100 (*)    Leukocytes, UA SMALL (*)    All other components within normal limits  URINE MICROSCOPIC-ADD ON - Abnormal; Notable for the following:    Bacteria, UA FEW (*)    Crystals CA OXALATE CRYSTALS (*)    All other components within normal limits    Imaging Review No results found.   EKG Interpretation None      MDM   Final diagnoses:  UTI (lower urinary tract infection)    1:53 PM Patient currently sleeping after IM toradol. Patient requested medicaton IM instead of IV. Vitals stable and patient afebrile.   2:59 PM Patient's urine shows infection. Patient likely had a kidney stone that has passed. Patient will be discharged with keflex for infection. Vitals stable and patient afebrile.    Emilia Beck, PA-C 09/10/14 1500  Doug Sou, MD 09/10/14 1600

## 2014-09-10 NOTE — ED Notes (Signed)
Pt very sleepy and hard to arouse. Asked for urine sample and pt kept falling asleep.

## 2014-09-10 NOTE — ED Notes (Addendum)
Pt c/o lt sided flank pain since he woke up this morning.  Denies NVD, dysuria, or hematuria.  Pt will writhe in pain, moan, and fidget and then yawn and go to sleep during triage.  Pt admits to using molly last night.

## 2014-09-16 ENCOUNTER — Emergency Department (HOSPITAL_COMMUNITY)
Admission: EM | Admit: 2014-09-16 | Discharge: 2014-09-20 | Disposition: A | Discharge status: Home / Self Care | Payer: Self-pay | Attending: Emergency Medicine | Admitting: Emergency Medicine

## 2014-09-16 ENCOUNTER — Encounter (HOSPITAL_COMMUNITY): Payer: Self-pay | Admitting: *Deleted

## 2014-09-16 DIAGNOSIS — N390 Urinary tract infection, site not specified: Secondary | ICD-10-CM | POA: Insufficient documentation

## 2014-09-16 DIAGNOSIS — F1994 Other psychoactive substance use, unspecified with psychoactive substance-induced mood disorder: Secondary | ICD-10-CM | POA: Diagnosis present

## 2014-09-16 DIAGNOSIS — F15959 Other stimulant use, unspecified with stimulant-induced psychotic disorder, unspecified: Secondary | ICD-10-CM | POA: Diagnosis present

## 2014-09-16 DIAGNOSIS — F1420 Cocaine dependence, uncomplicated: Secondary | ICD-10-CM | POA: Diagnosis present

## 2014-09-16 DIAGNOSIS — F1920 Other psychoactive substance dependence, uncomplicated: Secondary | ICD-10-CM

## 2014-09-16 DIAGNOSIS — F1110 Opioid abuse, uncomplicated: Secondary | ICD-10-CM | POA: Insufficient documentation

## 2014-09-16 DIAGNOSIS — F1120 Opioid dependence, uncomplicated: Secondary | ICD-10-CM | POA: Diagnosis present

## 2014-09-16 DIAGNOSIS — Z79899 Other long term (current) drug therapy: Secondary | ICD-10-CM | POA: Insufficient documentation

## 2014-09-16 DIAGNOSIS — Z720 Tobacco use: Secondary | ICD-10-CM | POA: Insufficient documentation

## 2014-09-16 DIAGNOSIS — Z792 Long term (current) use of antibiotics: Secondary | ICD-10-CM | POA: Insufficient documentation

## 2014-09-16 DIAGNOSIS — Z8619 Personal history of other infectious and parasitic diseases: Secondary | ICD-10-CM | POA: Insufficient documentation

## 2014-09-16 DIAGNOSIS — F1910 Other psychoactive substance abuse, uncomplicated: Secondary | ICD-10-CM

## 2014-09-16 DIAGNOSIS — F1410 Cocaine abuse, uncomplicated: Secondary | ICD-10-CM | POA: Insufficient documentation

## 2014-09-16 LAB — COMPREHENSIVE METABOLIC PANEL
ALBUMIN: 3.2 g/dL — AB (ref 3.5–5.0)
ALT: 13 U/L — ABNORMAL LOW (ref 17–63)
ANION GAP: 10 (ref 5–15)
AST: 25 U/L (ref 15–41)
Alkaline Phosphatase: 60 U/L (ref 38–126)
BUN: 14 mg/dL (ref 6–20)
CO2: 28 mmol/L (ref 22–32)
Calcium: 9.1 mg/dL (ref 8.9–10.3)
Chloride: 99 mmol/L — ABNORMAL LOW (ref 101–111)
Creatinine, Ser: 1.18 mg/dL (ref 0.61–1.24)
GFR calc non Af Amer: >60 mL/min (ref >60)
GLUCOSE: 192 mg/dL — AB (ref 65–99)
Potassium: 5.1 mmol/L (ref 3.5–5.1)
Sodium: 137 mmol/L (ref 135–145)
Total Bilirubin: 0.7 mg/dL (ref 0.3–1.2)
Total Protein: 7.1 g/dL (ref 6.5–8.1)

## 2014-09-16 LAB — CBC
HCT: 45.8 % (ref 39.0–52.0)
Hemoglobin: 15.2 g/dL (ref 13.0–17.0)
MCH: 31.4 pg (ref 26.0–34.0)
MCHC: 33.2 g/dL (ref 30.0–36.0)
MCV: 94.6 fL (ref 78.0–100.0)
Platelets: 260 10*3/uL (ref 150–400)
RBC: 4.84 MIL/uL (ref 4.22–5.81)
RDW: 12.5 % (ref 11.5–15.5)
WBC: 14.9 10*3/uL — ABNORMAL HIGH (ref 4.0–10.5)

## 2014-09-16 LAB — RAPID URINE DRUG SCREEN, HOSP PERFORMED
AMPHETAMINES: NOT DETECTED
Barbiturates: NOT DETECTED
Benzodiazepines: NOT DETECTED
Cocaine: POSITIVE — AB
Opiates: POSITIVE — AB
TETRAHYDROCANNABINOL: NOT DETECTED

## 2014-09-16 LAB — ACETAMINOPHEN LEVEL: Acetaminophen (Tylenol), Serum: <10 ug/mL — ABNORMAL LOW (ref 10–30)

## 2014-09-16 LAB — SALICYLATE LEVEL: Salicylate Lvl: <4 mg/dL (ref 2.8–30.0)

## 2014-09-16 LAB — ETHANOL: Alcohol, Ethyl (B): <5 mg/dL (ref <5)

## 2014-09-16 MED ORDER — CLONIDINE HCL 0.1 MG PO TABS
0.1000 mg | ORAL_TABLET | Freq: Four times a day (QID) | ORAL | Status: DC
  Administered 2014-09-16 – 2014-09-17 (×2): 0.1 mg via ORAL
  Filled 2014-09-16 (×2): qty 1

## 2014-09-16 MED ORDER — HYDROXYZINE HCL 25 MG PO TABS
50.0000 mg | ORAL_TABLET | Freq: Four times a day (QID) | ORAL | Status: DC | PRN
  Administered 2014-09-16 – 2014-09-19 (×7): 50 mg via ORAL
  Filled 2014-09-16 (×7): qty 2

## 2014-09-16 MED ORDER — LOPERAMIDE HCL 2 MG PO CAPS
2.0000 mg | ORAL_CAPSULE | ORAL | Status: DC | PRN
Start: 2014-09-16 — End: 2014-09-20

## 2014-09-16 MED ORDER — CLONIDINE HCL 0.1 MG PO TABS: 0.1000 mg | ORAL_TABLET | ORAL | Status: DC

## 2014-09-16 MED ORDER — NAPROXEN 500 MG PO TABS
500.0000 mg | ORAL_TABLET | Freq: Two times a day (BID) | ORAL | Status: DC | PRN
  Administered 2014-09-17 – 2014-09-20 (×5): 500 mg via ORAL
  Filled 2014-09-16 (×5): qty 1

## 2014-09-16 MED ORDER — CLONIDINE HCL 0.1 MG PO TABS: 0.1000 mg | ORAL_TABLET | Freq: Every day | ORAL | Status: DC

## 2014-09-16 MED ORDER — METHOCARBAMOL 500 MG PO TABS
500.0000 mg | ORAL_TABLET | Freq: Three times a day (TID) | ORAL | Status: DC | PRN
  Administered 2014-09-17: 500 mg via ORAL
  Filled 2014-09-16: qty 1

## 2014-09-16 MED ORDER — DICYCLOMINE HCL 20 MG PO TABS
20.0000 mg | ORAL_TABLET | Freq: Four times a day (QID) | ORAL | Status: DC | PRN
  Administered 2014-09-16 – 2014-09-18 (×3): 20 mg via ORAL
  Filled 2014-09-16 (×3): qty 1

## 2014-09-16 MED ORDER — ONDANSETRON 4 MG PO TBDP
4.0000 mg | ORAL_TABLET | Freq: Four times a day (QID) | ORAL | Status: DC | PRN
  Administered 2014-09-17 – 2014-09-18 (×2): 4 mg via ORAL
  Filled 2014-09-16 (×2): qty 1

## 2014-09-16 NOTE — ED Notes (Signed)
Pt admitted to room 37 from the [facility].  He was IVC'd by his mother, Pinchus Weckwerth, for suicidal thoughts and threats against his mother.  All of which he denies at this time.  Pt admits to using cocaine and molly's in the past few days.  He denies any alcohol or heroine in over two weeks.  He states that he takes Methadone  qd and last dose was yesterday.  He has been taking an antibiotic for a UTI since last week.  He doesn't know what it is but states that he had urine taken today.  He denies any suicidal thoughts or homicidal thoughts.  Pleasant and cooperative on the unit.  Initiated q 15 minute checks and safety maintained.
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
## 2014-09-16 NOTE — ED Provider Notes (Signed)
CSN: [id number]     Arrival date & time 09/16/14  1522 History   First MD Initiated Contact with Patient 09/16/14 1606     Chief Complaint  Patient presents with  . Addiction Problem  . IVC      HPI  Victor Gomez presents for evaluation and her IVC by his mother. He has a lengthy IVC written by his mother stating that he uses cocaine and heroin daily. He is still on her TV in her car) ponder them for money. She states that he states he lying himself or kill himself or others if not given treatment.  Patient states that he does use cocaine on occasion last used yesterday with a girlfriend. He states that he has been in a methadone clinic on 40 mg of methadone for the last 3 months. He denies any withdrawal symptoms. He states that he will occasionally miss a day of his methadone at [facility]. However, he denies that he has significant withdrawal symptoms on these days. States that he did not go this morning. He states he was surprised that his mother wrote the IVC as I read it to them.   Past Medical History  Diagnosis Date  . Methadone use     clinic each day  . Hepatitis C    Past Surgical History  Procedure Laterality Date  . Hernia repair     No family history on file. History  Substance Use Topics  . Smoking status: Current Every Day Smoker -- 0.50 packs/day    Types: Cigarettes  . Smokeless tobacco: Never Used  . Alcohol Use: Yes    Review of Systems  Constitutional: Negative for fever, chills, diaphoresis, appetite change and fatigue.  HENT: Negative for mouth sores, sore throat and trouble swallowing.   Eyes: Negative for visual disturbance.  Respiratory: Negative for cough, chest tightness, shortness of breath and wheezing.   Cardiovascular: Negative for chest pain.  Gastrointestinal: Negative for nausea, vomiting, abdominal pain, diarrhea and abdominal distention.  Endocrine: Negative for polydipsia, polyphagia and polyuria.  Genitourinary: Negative for dysuria,  frequency and hematuria.  Musculoskeletal: Negative for gait problem.  Skin: Negative for color change, pallor and rash.  Neurological: Negative for dizziness, syncope, light-headedness and headaches.  Hematological: Does not bruise/bleed easily.  Psychiatric/Behavioral: Negative for behavioral problems and confusion.      Allergies  Review of patient's allergies indicates no known allergies.  Home Medications   Prior to Admission medications   Medication Sig Start Date End Date Taking? Authorizing Provider  acetaminophen (TYLENOL) 500 MG tablet Take 1,000 mg by mouth every 6 (six) hours as needed for moderate pain.   Yes Historical Provider, MD  cephALEXin (KEFLEX) 500 MG capsule Take 1 capsule (500 mg total) by mouth 4 (four) times daily. 09/10/14  Yes Kaitlyn Szekalski, PA-C  diphenhydrAMINE (BENADRYL) 25 mg capsule Take 25 mg by mouth every 6 (six) hours as needed for itching or allergies. Allergies/sinus drainage   Yes Historical Provider, MD  methadone (DOLOPHINE) 1 mg/ml oral solution Take 40 mg/kg by mouth daily.   Yes Historical Provider, MD   BP 124/77 mmHg  Pulse 74  Temp(Src) 98.5 F (36.9 C) (Oral)  Resp 18  SpO2 100% Physical Exam  Constitutional: He is oriented to person, place, and time. He appears well-developed and well-nourished. No distress.  HENT:  Head: Normocephalic.  Eyes: Conjunctivae are normal. Pupils are equal, round, and reactive to light. No scleral icterus.  Neck: Normal range of motion. Neck supple. No thyromegaly  present.  Cardiovascular: Normal rate and regular rhythm.  Exam reveals no gallop and no friction rub.   No murmur heard. Pulmonary/Chest: Effort normal and breath sounds normal. No respiratory distress. He has no wheezes. He has no rales.  Abdominal: Soft. Bowel sounds are normal. He exhibits no distension. There is no tenderness. There is no rebound.  Musculoskeletal: Normal range of motion.  Neurological: He is alert and oriented to  person, place, and time.  Skin: Skin is warm and dry. No rash noted.  Psychiatric: He has a normal mood and affect. His behavior is normal.    ED Course  Procedures (including critical care time) Labs Review Labs Reviewed  COMPREHENSIVE METABOLIC PANEL - Abnormal; Notable for the following:    Chloride 99 (*)    Glucose, Bld 192 (*)    Albumin 3.2 (*)    ALT 13 (*)    All other components within normal limits  ACETAMINOPHEN LEVEL - Abnormal; Notable for the following:    Acetaminophen (Tylenol), Serum <10 (*)    All other components within normal limits  CBC - Abnormal; Notable for the following:    WBC 14.9 (*)    All other components within normal limits  URINE RAPID DRUG SCREEN, HOSP PERFORMED - Abnormal; Notable for the following:    Opiates POSITIVE (*)    Cocaine POSITIVE (*)    All other components within normal limits  URINE CULTURE  ETHANOL  SALICYLATE LEVEL  URINALYSIS, ROUTINE W REFLEX MICROSCOPIC (NOT AT [facility])    Imaging Review No results found.   EKG Interpretation None      MDM   Final diagnoses:  Polysubstance abuse    Patient is calm and cooperative here. Pleasant. He denies stating he would harm himself. He denies any feelings of self-harm. He denies any intent to harm towards others. He denies that he is stolen from his mother.  Patient evaluated by behavioral health staff. I would agree with their assessment in that he denies any of all symptoms that would suggest he needs IVC. However, there such a marked discrepancy between his report and advised mother that the staff is recommended that he be reevaluated by psychiatry in the morning. He is acceptable to this. He is medically clear. Comfortable and stable here.    Rolland Porter, MD 09/16/14 [phone]

## 2014-09-16 NOTE — ED Notes (Signed)
Info pt of urine sample. Pt state he will like to rest awhile then will be able to give urine. Nurse is aware
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
## 2014-09-16 NOTE — BHH Counselor (Signed)
[facility] Assessment Progress Note  Per Renata Caprice, DNP, pt will be kept overnight for observation, due to the inconsistencies between pt's report and IVC paperwork. Nurses progress notes will be reviewed and patient will be re-evaluated in the morning. Counselor advised EDP, Dr. Fayrene Fearing and pt's nurse, Jan, of the disposition.   Johny Shock. Ladona Ridgel, MS, NCC, LPCA Disposition Counselor
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
## 2014-09-17 DIAGNOSIS — F1994 Other psychoactive substance use, unspecified with psychoactive substance-induced mood disorder: Secondary | ICD-10-CM

## 2014-09-17 DIAGNOSIS — F1420 Cocaine dependence, uncomplicated: Secondary | ICD-10-CM | POA: Diagnosis present

## 2014-09-17 DIAGNOSIS — F15959 Other stimulant use, unspecified with stimulant-induced psychotic disorder, unspecified: Secondary | ICD-10-CM | POA: Diagnosis present

## 2014-09-17 DIAGNOSIS — F1910 Other psychoactive substance abuse, uncomplicated: Secondary | ICD-10-CM | POA: Insufficient documentation

## 2014-09-17 LAB — URINE MICROSCOPIC-ADD ON

## 2014-09-17 LAB — URINALYSIS, ROUTINE W REFLEX MICROSCOPIC
Bilirubin Urine: NEGATIVE
GLUCOSE, UA: 100 mg/dL — AB
KETONES UR: NEGATIVE mg/dL
Nitrite: NEGATIVE
PROTEIN: 30 mg/dL — AB
SPECIFIC GRAVITY, URINE: 1.013 (ref 1.005–1.030)
UROBILINOGEN UA: 0.2 mg/dL (ref 0.0–1.0)
pH: 6 (ref 5.0–8.0)

## 2014-09-17 MED ORDER — NICOTINE 21 MG/24HR TD PT24
21.0000 mg | MEDICATED_PATCH | Freq: Every day | TRANSDERMAL | Status: DC
  Administered 2014-09-17 – 2014-09-20 (×4): 21 mg via TRANSDERMAL
  Filled 2014-09-17 (×4): qty 1

## 2014-09-17 MED ORDER — SULFAMETHOXAZOLE-TRIMETHOPRIM 800-160 MG PO TABS
1.0000 | ORAL_TABLET | Freq: Two times a day (BID) | ORAL | Status: DC
  Administered 2014-09-17 – 2014-09-20 (×8): 1 via ORAL
  Filled 2014-09-17 (×8): qty 1

## 2014-09-17 MED ORDER — CLONIDINE HCL 0.1 MG PO TABS
0.1000 mg | ORAL_TABLET | Freq: Four times a day (QID) | ORAL | Status: DC | PRN
  Administered 2014-09-17 – 2014-09-18 (×2): 0.1 mg via ORAL
  Filled 2014-09-17 (×2): qty 1

## 2014-09-17 MED ORDER — METHADONE HCL 5 MG PO TABS
20.0000 mg | ORAL_TABLET | Freq: Every day | ORAL | Status: DC
  Administered 2014-09-17 – 2014-09-19 (×3): 20 mg via ORAL
  Filled 2014-09-17 (×3): qty 4

## 2014-09-17 NOTE — BHH Counselor (Signed)
Pending review for possible placement with [facility].  

## 2014-09-17 NOTE — ED Notes (Signed)
Pt was noted profusely sweating.  Stated he feels horrible and claims to be withdrawing from methadone.  MD aware.

## 2014-09-17 NOTE — BH Assessment (Addendum)
Reassessment Pt is alert and oriented. He reports he has been sleeping and eating fine since admission. He reports he believes his mother took out IVC papers on him because he has a drug problem and "She would do anything to get me help." Pt reports he has been trying to address his drug problems, staying away from people who use, trying to cut back use. He reports he has been going to [facility] for methadone and believes he needs to continues with methadone because when he has tried to stop in the past he relapsed on heroin. He reports that is due to chronic pain issues. Pt reports he is still using cocaine at times. He is open to inpt treatment for SA issues. He rates his depression about a 4 out 10 and his anxiety 5/10. He denies SI or HI. He reports he can not return to [facility].   Reassessment needs to run by provider.     Clista Bernhardt, [facility] Triage Specialist 09/17/2014 6:03 AM

## 2014-09-17 NOTE — BH Assessment (Signed)
BHH Assessment Progress Note  The following facilities have been contacted to seek placement for this pt, with results as noted:  Beds available, information sent, decision pending:  [facility]  No beds available, but accepting referrals for future consideration; information sent:  [facility]  At capacity:  [facility] [address] [place], [place] Triage Specialist [phone]

## 2014-09-17 NOTE — ED Notes (Signed)
Patient calm, cooperative. Denies SI, HI, AVH. Reports claminess, knots in his stomach, feeling tired, and "weird" dreams.   Encouragement offered.  Q 15 safety checks continue.

## 2014-09-17 NOTE — ED Notes (Signed)
Patient pleasant, cooperative. Denies SI, HI, AVH. Denies feelings of depression. Rates anxiety 1/10. Reports mild abd cramping. Patient has been resting and snacking.  Encouragement offered. Gatorade provided.  Q 15 safety checks continue.

## 2014-09-17 NOTE — Consult Note (Signed)
[facility] Consult   Reason for Consult:  Cocaine use disorder, Moderate Dependence Referring Physician:  EDP Patient Identification: Victor Gomez MRN:  [id number] Principal Diagnosis: Cocaine use disorder, moderate, dependence Diagnosis:   Patient Active Problem List   Diagnosis Date Noted  . Cocaine use disorder, moderate, dependence [F14.20] 09/17/2014    Priority: High  . Substance induced mood disorder [F19.94] 09/17/2014  . Polysubstance dependence including opioid type drug, continuous use [F11.229] 12/06/2013  . Hepatitis C antibody test positive [R89.4] 03/13/2013  . Elevated transaminase level [R74.0] 03/13/2013    Total Time spent with patient: 1 hour  Subjective:   Victor Gomez is a 39 y.o. male patient admitted with Cocaine use disorder, Moderate, Dependence.  HPI:  Caucasian, male, 39 years old was evaluated for Substance abuse IVC by his mother for detox treatment.  Patient denies what is documented in his IVC paper.  Patient admitted that he has been having issues with addiction to various substances.  He has been on Cocaine off and on for 15 years.  He reports that his mother does not feel he can live on his own and make decisions on his own at his age.   He states that his mother IVC because she felt he needed treatment for using Cocaine while he is taking Methadone.  Patient was sweating profusely on our arrival to his room and stated that he was withdrawing from Methadone.  He denied SI/HI/AVH as written in the IVC paper but has agreed to seek detox treatment from cocaine.  Patient reports several detox treatment in the past that failed but admitted to getting off Opiates since he started using Methadone.   He receives Methadone treatment at [facility] substance abuse treatment.  Patient admits to previous diagnosis of anxiety and depression and was prescribed Paxil.  He took Paxil for 4 Months and stopped.  Today he vehemently stated that he will not  take any antidepressant medications.  He has been accepted for admission and we will be seeking placement at any facilities with available beds.  HPI Elements:   Location:  Cocaine use disorder, Moderate, dependence, Anxiety disorder, Depressive disorder, recurrent. Quality:  severe-moderate. Severity:  Severe-moderate. Timing:  Acute. Duration:  Since age 22 or more. Context:  IVC by his mother for Cocaine use while on Methadone..  Past Medical History:  Past Medical History  Diagnosis Date  . Methadone use     clinic each day  . Hepatitis C     Past Surgical History  Procedure Laterality Date  . Hernia repair     Family History: No family history on file. Social History:  History  Alcohol Use  . Yes     History  Drug Use  . Yes  . Special: Marijuana    Comment: heroin use, last use 10/15 Meth, opiates    History   Social History  . Marital Status: Legally Separated    Spouse Name: N/A  . Number of Children: N/A  . Years of Education: N/A   Social History Main Topics  . Smoking status: Current Every Day Smoker -- 0.50 packs/day    Types: Cigarettes  . Smokeless tobacco: Never Used  . Alcohol Use: Yes  . Drug Use: Yes    Special: Marijuana     Comment: heroin use, last use 10/15 Meth, opiates  . Sexual Activity: Not on file   Other Topics Concern  . None   Social History Narrative   Additional Social  History:    Pain Medications: SEE MAR Prescriptions: SEE MAR Over the Counter: SEE MAR History of alcohol / drug use?: Yes Longest period of sobriety (when/how long): "a couple of months" Negative Consequences of Use:  (none specified) Withdrawal Symptoms:  (denies any withdrawal sx) Name of Substance 1: Cocaine 1 - Age of First Use: early 22s 1 - Amount (size/oz): unspecified 1 - Frequency: unspecified (when someone comes around with some) 1 - Last Use / Amount: last night/ $20 worth                   Allergies:  No Known Allergies  Labs:   Results for orders placed or performed during the hospital encounter of 09/16/14 (from the past 48 hour(s))  Comprehensive metabolic panel     Status: Abnormal   Collection Time: 09/16/14  4:57 PM  Result Value Ref Range   Sodium 137 135 - 145 mmol/L   Potassium 5.1 3.5 - 5.1 mmol/L   Chloride 99 (L) 101 - 111 mmol/L   CO2 28 22 - 32 mmol/L   Glucose, Bld 192 (H) 65 - 99 mg/dL   BUN 14 6 - 20 mg/dL   Creatinine, Ser 1.18 0.61 - 1.24 mg/dL   Calcium 9.1 8.9 - 10.3 mg/dL   Total Protein 7.1 6.5 - 8.1 g/dL   Albumin 3.2 (L) 3.5 - 5.0 g/dL   AST 25 15 - 41 U/L   ALT 13 (L) 17 - 63 U/L   Alkaline Phosphatase 60 38 - 126 U/L   Total Bilirubin 0.7 0.3 - 1.2 mg/dL   GFR calc non Af Amer >60 >60 mL/min   GFR calc Af Amer >60 >60 mL/min    Comment: (NOTE) The eGFR has been calculated using the CKD EPI equation. This calculation has not been validated in all clinical situations. eGFR's persistently <60 mL/min signify possible Chronic Kidney Disease.    Anion gap 10 5 - 15  Ethanol (ETOH)     Status: None   Collection Time: 09/16/14  4:57 PM  Result Value Ref Range   Alcohol, Ethyl (B) <5 <5 mg/dL    Comment:        LOWEST DETECTABLE LIMIT FOR SERUM ALCOHOL IS 5 mg/dL FOR MEDICAL PURPOSES ONLY   Salicylate level     Status: None   Collection Time: 09/16/14  4:57 PM  Result Value Ref Range   Salicylate Lvl <6.4 2.8 - 30.0 mg/dL  Acetaminophen level     Status: Abnormal   Collection Time: 09/16/14  4:57 PM  Result Value Ref Range   Acetaminophen (Tylenol), Serum <10 (L) 10 - 30 ug/mL    Comment:        THERAPEUTIC CONCENTRATIONS VARY SIGNIFICANTLY. A RANGE OF 10-30 ug/mL MAY BE AN EFFECTIVE CONCENTRATION FOR MANY PATIENTS. HOWEVER, SOME ARE BEST TREATED AT CONCENTRATIONS OUTSIDE THIS RANGE. ACETAMINOPHEN CONCENTRATIONS >150 ug/mL AT 4 HOURS AFTER INGESTION AND >50 ug/mL AT 12 HOURS AFTER INGESTION ARE OFTEN ASSOCIATED WITH TOXIC REACTIONS.   CBC     Status: Abnormal    Collection Time: 09/16/14  4:57 PM  Result Value Ref Range   WBC 14.9 (H) 4.0 - 10.5 K/uL   RBC 4.84 4.22 - 5.81 MIL/uL   Hemoglobin 15.2 13.0 - 17.0 g/dL   HCT 45.8 39.0 - 52.0 %   MCV 94.6 78.0 - 100.0 fL   MCH 31.4 26.0 - 34.0 pg   MCHC 33.2 30.0 - 36.0 g/dL   RDW 12.5 11.5 -  15.5 %   Platelets 260 150 - 400 K/uL  Urine rapid drug screen (hosp performed) (Not at [facility])     Status: Abnormal   Collection Time: 09/16/14  5:38 PM  Result Value Ref Range   Opiates POSITIVE (A) NONE DETECTED   Cocaine POSITIVE (A) NONE DETECTED   Benzodiazepines NONE DETECTED NONE DETECTED   Amphetamines NONE DETECTED NONE DETECTED   Tetrahydrocannabinol NONE DETECTED NONE DETECTED   Barbiturates NONE DETECTED NONE DETECTED    Comment:        DRUG SCREEN FOR MEDICAL PURPOSES ONLY.  IF CONFIRMATION IS NEEDED FOR ANY PURPOSE, NOTIFY LAB WITHIN 5 DAYS.        LOWEST DETECTABLE LIMITS FOR URINE DRUG SCREEN Drug Class       Cutoff (ng/mL) Amphetamine      1000 Barbiturate      200 Benzodiazepine   242 Tricyclics       683 Opiates          300 Cocaine          300 THC              50   Urinalysis, Routine w reflex microscopic (not at [facility])     Status: Abnormal   Collection Time: 09/16/14  9:54 PM  Result Value Ref Range   Color, Urine YELLOW YELLOW   APPearance CLOUDY (A) CLEAR   Specific Gravity, Urine 1.013 1.005 - 1.030   pH 6.0 5.0 - 8.0   Glucose, UA 100 (A) NEGATIVE mg/dL   Hgb urine dipstick MODERATE (A) NEGATIVE   Bilirubin Urine NEGATIVE NEGATIVE   Ketones, ur NEGATIVE NEGATIVE mg/dL   Protein, ur 30 (A) NEGATIVE mg/dL   Urobilinogen, UA 0.2 0.0 - 1.0 mg/dL   Nitrite NEGATIVE NEGATIVE   Leukocytes, UA MODERATE (A) NEGATIVE  Urine microscopic-add on     Status: None   Collection Time: 09/16/14  9:54 PM  Result Value Ref Range   Squamous Epithelial / LPF RARE RARE   WBC, UA TOO NUMEROUS TO COUNT <3 WBC/hpf    Comment: WITH CLUMPS   RBC / HPF 0-2 <3 RBC/hpf   Bacteria, UA RARE  RARE   Urine-Other FEW YEAST     Vitals: Blood pressure 124/71, pulse 68, temperature 98.4 F (36.9 C), temperature source Oral, resp. rate 17, SpO2 99 %.  Risk to Self: Suicidal Ideation: No Suicidal Intent: No Is patient at risk for suicide?: No Suicidal Plan?: No Access to Means: No What has been your use of drugs/alcohol within the last 12 months?: Drinks "every now and then"; uses cocaine whenever "someone brings it" How many times?: 0 Other Self Harm Risks: n/a Triggers for Past Attempts: Other (Comment) (n/a) Intentional Self Injurious Behavior: None Risk to Others: Homicidal Ideation: No Thoughts of Harm to Others: No Current Homicidal Intent: No Current Homicidal Plan: No Access to Homicidal Means: No Identified Victim: n/a History of harm to others?: No Assessment of Violence: None Noted Violent Behavior Description: n/a Does patient have access to weapons?: No Criminal Charges Pending?: No Does patient have a court date: No Prior Inpatient Therapy: Prior Inpatient Therapy: Yes Prior Therapy Dates: could not specify Prior Therapy Facilty/Provider(s): [facility] Reason for Treatment: substance abuse Prior Outpatient Therapy: Prior Outpatient Therapy: No Does patient have an ACCT team?: No Does patient have Intensive In-House Services?  : No Does patient have [facility] services? : No Does patient have P4CC services?: No  Current Facility-Administered Medications  Medication Dose  Route Frequency Provider Last Rate Last Dose  . cloNIDine (CATAPRES) tablet 0.1 mg  0.1 mg Oral Q6H PRN Latarsha Zani      . dicyclomine (BENTYL) tablet 20 mg  20 mg Oral Q6H PRN Laverle Hobby, PA-C   20 mg at 09/16/14 2158  . hydrOXYzine (ATARAX/VISTARIL) tablet 50 mg  50 mg Oral Q6H PRN Laverle Hobby, PA-C   50 mg at 09/16/14 2158  . loperamide (IMODIUM) capsule 2-4 mg  2-4 mg Oral PRN Laverle Hobby, PA-C      . methadone (DOLOPHINE) tablet 20 mg  20 mg Oral Daily Harold Moncus   20 mg at 09/17/14 1027  . methocarbamol (ROBAXIN) tablet 500 mg  500 mg Oral Q8H PRN Laverle Hobby, PA-C   500 mg at 09/17/14 1115  . naproxen (NAPROSYN) tablet 500 mg  500 mg Oral BID PRN Laverle Hobby, PA-C   500 mg at 09/17/14 1111  . ondansetron (ZOFRAN-ODT) disintegrating tablet 4 mg  4 mg Oral Q6H PRN Laverle Hobby, PA-C      . sulfamethoxazole-trimethoprim (BACTRIM DS,SEPTRA DS) 800-160 MG per tablet 1 tablet  1 tablet Oral Q12H Linton Flemings, MD   1 tablet at 09/17/14 1111   Current Outpatient Prescriptions  Medication Sig Dispense Refill  . acetaminophen (TYLENOL) 500 MG tablet Take 1,000 mg by mouth every 6 (six) hours as needed for moderate pain.    . cephALEXin (KEFLEX) 500 MG capsule Take 1 capsule (500 mg total) by mouth 4 (four) times daily. 20 capsule 0  . diphenhydrAMINE (BENADRYL) 25 mg capsule Take 25 mg by mouth every 6 (six) hours as needed for itching or allergies. Allergies/sinus drainage    . methadone (DOLOPHINE) 1 mg/ml oral solution Take 40 mg/kg by mouth daily.      Musculoskeletal: Strength & Muscle Tone: within normal limits Gait & Station: normal Patient leans: N/A  Psychiatric Specialty Exam: Physical Exam  Review of Systems  Constitutional: Negative.   HENT: Negative.   Eyes: Negative.   Respiratory: Negative.   Cardiovascular: Negative.   Gastrointestinal: Negative.   Genitourinary: Negative.   Musculoskeletal: Negative.   Skin: Negative.   Neurological: Negative.   Endo/Heme/Allergies: Negative.     Blood pressure 124/71, pulse 68, temperature 98.4 F (36.9 C), temperature source Oral, resp. rate 17, SpO2 99 %.There is no weight on file to calculate BMI.  General Appearance: Casual and Fairly Groomed  Engineer, water::  Good  Speech:  Clear and Coherent and Normal Rate  Volume:  Normal  Mood:  Anxious  Affect:  Congruent  Thought Process:  Coherent, Goal Directed and Intact  Orientation:  Full (Time, Place, and Person)  Thought  Content:  WDL  Suicidal Thoughts:  No  Homicidal Thoughts:  No  Memory:  Immediate;   Good Recent;   Good Remote;   Good  Judgement:  Good  Insight:  Good  Psychomotor Activity:  Tremor  Concentration:  Good  Recall:  NA  Fund of Knowledge:Good  Language: Good  Akathisia:  NA  Handed:  Right  AIMS (if indicated):     Assets:  Desire for Improvement  ADL's:  Intact  Cognition: WNL  Sleep:      Medical Decision Making: Review of Psycho-Social Stressors (1)  Treatment Plan Summary: Daily contact with patient to assess and evaluate symptoms and progress in treatment and Medication management  Plan:  Placed on Opioid detox treatment using Clonidine detox,  Start Amantadine 100 mg  po bid for Cocaine Craving.  Continue Septra DS for UTI treatment and the rest of his home medication. Disposition: Admit, seek placement for detox and stabilization.  Delfin Gant   PMHNP-BC 09/17/2014 11:36 AM Patient seen face-to-face for psychiatric evaluation, chart reviewed and case discussed with the physician extender and developed treatment plan. Reviewed the information documented and agree with the treatment plan. Corena Pilgrim, MD

## 2014-09-18 MED ORDER — MAGNESIUM HYDROXIDE 400 MG/5ML PO SUSP
30.0000 mL | Freq: Once | ORAL | Status: AC
  Administered 2014-09-18: 30 mL via ORAL
  Filled 2014-09-18: qty 30

## 2014-09-18 NOTE — Care Management Note (Signed)
Case Management Note  Patient Details  Name: Victor Gomez MRN: [id number] Date of Birth: Sep 19, 1975  Subjective/Objective:     39 yr old [place] followed by [facility] medical provider PMH hep C Last seen by Dr Staci Righter on 05/13/13 for office visit Last [facility] office visit noted in EPIC was 08/28/13 with Dr Effie Berkshire indicates a voice message was left for pt by [facility] SW pending a return call from pt     Action/Plan: CM checked pt Hep C f/u progress   Expected Discharge Date:   09/18/14               Expected Discharge Plan:   Pending bed search for Inpatient BH bed at [facility] or other facility   In-House Referral:   ED SW  Discharge planning Services    Pending bed search for Inpatient [facility] bed at [facility] or other facility  If discussed at Long Length of Stay Meetings, dates discussed:  09/17/14 inpatient admission with recommended [facility] 300 bed or other facility  Pending disposition on 09/18/14 Hepatitis f/u  Additional Comments:  Ophelia Shoulder, RN 09/18/2014, 9:58 AM

## 2014-09-18 NOTE — ED Notes (Signed)
Araceli's mom called, [place]. Explained that he hadn't signed a release so I couldn't discuss details of care, but she just wanted to provide collateral info. She said she is a Clinical cytogeneticist wreck" because she fears he will be discharged and come banging on her door. She said he isn't welcome at her house and will need to find a shelter. She said his father has hx of paranoid schizophrenia.   Also, she said he'll need a note for [facility] (where he gets methadone) indicated dates he was here.

## 2014-09-18 NOTE — ED Notes (Signed)
Victor Gomez has been resting for much of the morning. He's been calm and cooperative. He denies pain and reports he feels better/denies withdrawal symptoms. He denied dizziness or lightheadedness. He is ambulating without difficulty. Pt has been urged to consume fluids and has been observed doing so. He denies SI/HI/AVH. Will continue to monitor for needs/safety.

## 2014-09-18 NOTE — BH Assessment (Signed)
BHH Assessment Progress Note  The following facilities have been contacted to seek placement for this pt, with results as noted:  Beds available, information sent, decision pending:  [facility] Sharyne Richters   Fax will not transmit:  [facility]   Declined, unspecified clinical reason:  [facility]   At capacity:  [facility] Delice Lesch [facility]    Doylene Canning, [place] Triage Specialist [phone]

## 2014-09-18 NOTE — ED Notes (Signed)
Pt. Requesting med for sleep.

## 2014-09-18 NOTE — BHH Counselor (Addendum)
Reassessed patient on 09/18/2014 at beginning of shift.  Patient was asleep when I arrived but was able to provide his name and DOB. Patient was oriented to person, place, time, and situation. Patient states that he was IVC'd by his mother due to her reporting that he was going to burn down the house and hang himself because she wants him to get rehab. Patient denies that he wanted to burn down the house or hang himself. Patient states that he does not want to hurt himself or anyone else.  Patient states that he has used heroin and cocaine and mother would like for him to get treatment. Patient states that he would like a job and feels that if he works he "won't be around [place] all day and if I'm not around it, I won't use." Patient states that he is open to ouptatient treatment and would consider CDIOP.    Davina Poke, LCSW Therapeutic Triage Specialist [facility] 09/18/2014 8:23 AM   Dr. Jannifer Franklin and Nanine Means, DNP recommend inpatient treatment. 300 hall recommended at this time. TTS to seek placement.  Davina Poke, LCSW Therapeutic Triage Specialist [facility] 09/18/2014 9:16 AM

## 2014-09-18 NOTE — ED Notes (Signed)
Pt. Noted in room. No complaints or concerns voiced. No distress or abnormal behavior noted. Will continue to monitor with security cameras. Q 15 minute rounds continue. 

## 2014-09-18 NOTE — ED Notes (Signed)
Pt. Noted sleeping in room. No complaints or concerns voiced. No distress or abnormal behavior noted. Will continue to monitor with security cameras. Q 15 minute rounds continue. 

## 2014-09-18 NOTE — ED Notes (Signed)
Report received from Karen RN. Pt. Alert and oriented in no distress denies SI, HI, AVH.  Pt. Instructed to come to me with problems or concerns.Will continue to monitor for safety via security cameras and Q 15 minute checks. 

## 2014-09-18 NOTE — BHH Counselor (Signed)
Spoke with Prudencio Burly, RN, [facility] regarding placement at [facility], 300 hall recommended at this time.   Davina Poke, LCSW Therapeutic Triage Specialist [facility] 09/18/2014 9:53 AM

## 2014-09-18 NOTE — ED Notes (Signed)
Pt acuity note: Low  Pt is resting quietly.

## 2014-09-18 NOTE — ED Notes (Signed)
Pt. Noted in rest room. No complaints or concerns voiced. No distress or abnormal behavior noted. Will continue to monitor with security cameras. Q 15 minute rounds continue.  

## 2014-09-19 LAB — URINE CULTURE

## 2014-09-19 MED ORDER — METHADONE HCL 5 MG PO TABS
30.0000 mg | ORAL_TABLET | Freq: Every day | ORAL | Status: DC
  Administered 2014-09-20: 30 mg via ORAL
  Filled 2014-09-19: qty 6

## 2014-09-19 MED ORDER — FLUCONAZOLE 150 MG PO TABS
150.0000 mg | ORAL_TABLET | Freq: Once | ORAL | Status: AC
  Administered 2014-09-19: 150 mg via ORAL
  Filled 2014-09-19: qty 1

## 2014-09-19 NOTE — Consult Note (Signed)
  Psychiatric Specialty Exam: Physical Exam  ROS  Blood pressure 127/68, pulse 72, temperature 98 F (36.7 C), temperature source Oral, resp. rate 18, SpO2 99 %.There is no weight on file to calculate BMI.  General Appearance: Casual and Fairly Groomed  Patent attorney:: Good  Speech: Clear and Coherent and Normal Rate  Volume: Normal  Mood: Anxious  Affect: Congruent  Thought Process: Coherent, Goal Directed and Intact  Orientation: Full (Time, Place, and Person)  Thought Content: WDL  Suicidal Thoughts: No  Homicidal Thoughts: No  Memory: Immediate; Good Recent; Good Remote; Good  Judgement: Good  Insight: Good  Psychomotor Activity: Tremor  Concentration: Good  Recall: NA  Fund of Knowledge:Good  Language: Good  Akathisia: NA  Handed: Right  AIMS (if indicated):    Assets: Desire for Improvement  ADL's: Intact  Cognition: WNL  Sleep:            Patient was seen on rounds today still sweating and reported feeling anxious.  He admits that he is feeling a little better with his withdrawal symptoms but is still having some discomfort.  He reports good sleep but wakes up covered in sweats.  He reports some abdominal discomfort.  He wanted to leave today but was informed he is still having some withdrawal symptoms.  He adamantly refused to take any Psychotropic medications and stated that he will continue to take Methadone in other to prevent relapse.  Cocaine use disorder, moderate, dependence   Plan:  Increase Methadone to 30 mg po daily, offer PRN medications, Seek placement at an inpatient Psychiatric unit.  Dahlia Byes   PMHNP-BC  Patient seen face-to-face for the psychiatric consultation evaluation and [facility] long emergency department, case discussed with treatment team and the physician extender. Patient benefit from increased overdose of methadone as he continued to have withdrawal symptoms of opiates. Reviewed  the information documented and agree with the treatment plan.  Stony Stegmann,[name] 09/20/2014 5:45 PM

## 2014-09-19 NOTE — ED Notes (Signed)
Pt up to the desk requesting something for anxiety

## 2014-09-19 NOTE — ED Notes (Signed)
Pt. Noted sleeping in room. No complaints or concerns voiced. No distress or abnormal behavior noted. Will continue to monitor with security cameras. Q 15 minute rounds continue. 

## 2014-09-19 NOTE — ED Notes (Signed)
Pt reports that he has been on methadone x2 yrs and has been able to stay off off heroine with it.  Pt reports that he started using heroin because of back pain and dental problems.  Pt's teeth decayed to gum line.

## 2014-09-19 NOTE — ED Notes (Signed)
Pt. Noted in room. No complaints or concerns voiced. No distress or abnormal behavior noted. Will continue to monitor with security cameras. Q 15 minute rounds continue. 

## 2014-09-19 NOTE — ED Notes (Signed)
Report received from Janie Rambo RN. Pt. Alert and oriented in no distress denies SI, HI, AVH and pain.  Pt. Instructed to come to me with problems or concerns.Will continue to monitor for safety via security cameras and Q 15 minute checks. 

## 2014-09-19 NOTE — ED Notes (Signed)
Dr J and Josephine NP in w/ pt 

## 2014-09-19 NOTE — ED Notes (Signed)
Up to the bathroom 

## 2014-09-19 NOTE — ED Notes (Signed)
Pt reports that he is frustrated with his situation at home.  Pt reports that he lives with his mother and that he is trying to find a job so he can provide for his self.    Pt reports that when he is working, he is able to do fine-can provide for his self,and live independently.  Pt reports that he had a job lined up in [place] and that is when his mother filled out the IVC papers and had him brought up here.

## 2014-09-19 NOTE — ED Notes (Addendum)
Pt's mother called and reports that when/if he is released that he can not return to her home. She reports that she has told the patient this.

## 2014-09-19 NOTE — Progress Notes (Signed)
Disposition CSW completed additional referrals to the following inpatient psych facilities:  [facility]  CSW is available as needed for placement concerns for patient.  Seward Speck [facility] Disposition CSW [phone]

## 2014-09-19 NOTE — ED Notes (Signed)
Up tot he bathroom to shower and change scrubs 

## 2014-09-19 NOTE — ED Notes (Addendum)
Up to the bathroom, reports that visteril has helped

## 2014-09-20 MED ORDER — SULFAMETHOXAZOLE-TRIMETHOPRIM 800-160 MG PO TABS: 1.0000 | ORAL_TABLET | Freq: Two times a day (BID) | ORAL | Status: DC

## 2014-09-20 NOTE — BH Assessment (Signed)
BHH Assessment Progress Note Spoke with pt who states that he feels much better now, and that being in the hospital "feels like a vacation from his mom". He states he is glad he has gotten some rest and denies SI,HI, AVH at this time and also denies withdrawal symptoms.

## 2014-09-20 NOTE — ED Notes (Signed)
Up on the phone 

## 2014-09-20 NOTE — ED Notes (Signed)
Pt. Noted sleeping in room. No complaints or concerns voiced. No distress or abnormal behavior noted. Will continue to monitor with security cameras. Q 15 minute rounds continue. 

## 2014-09-20 NOTE — ED Notes (Signed)
Talking w/ CSW 

## 2014-09-20 NOTE — ED Notes (Signed)
IVC has been rescinded 

## 2014-09-20 NOTE — ED Notes (Signed)
Pt up in room, denies any s/s of withdrawal.  Pt reports that he is going to be dc'd, and is going to stay with a friend in [place] and return to [facility].

## 2014-09-20 NOTE — [facility] Suicide Risk Assessment (Cosign Needed)
Suicide Risk Assessment  Discharge Assessment   [facility] Discharge Suicide Risk Assessment   Demographic Factors:  Male, Caucasian, Low socioeconomic status and Unemployed  Total Time spent with patient: 20 minutes  Musculoskeletal: Strength & Muscle Tone: within normal limits Gait & Station: normal Patient leans: N/A  Psychiatric Specialty Exam:     Blood pressure 118/75, pulse 75, temperature 98.2 F (36.8 C), temperature source Oral, resp. rate 20, SpO2 96 %.There is no weight on file to calculate BMI.  General Appearance: Casual and Fairly Groomed  Patent attorney:: Good  Speech: Clear and Coherent and Normal Rate  Volume: Normal  Mood: Anxious  Affect: Congruent  Thought Process: Coherent, Goal Directed and Intact  Orientation: Full (Time, Place, and Person)  Thought Content: WDL  Suicidal Thoughts: No  Homicidal Thoughts: No  Memory: Immediate; Good Recent; Good Remote; Good  Judgement: Good  Insight: Good  Psychomotor Activity: NORMAL  Concentration: Good  Recall: NA  Fund of Knowledge:Good  Language: Good  Akathisia: NA  Handed: Right  AIMS (if indicated):   Assets: Desire for Improvement  ADL's: Intact  Cognition: WNL  Sleep:                Has this patient used any form of tobacco in the last 30 days? (Cigarettes, Smokeless Tobacco, Cigars, and/or Pipes) Yes, A prescription for an FDA-approved tobacco cessation medication was offered at discharge and the patient refused  Mental Status Per Nursing Assessment::   On Admission:     Current Mental Status by Physician: NA  Loss Factors: NA  Historical Factors: NA  Risk Reduction Factors:   Religious beliefs about death and Living with another person, especially a relative  Continued Clinical Symptoms:  Alcohol/Substance Abuse/Dependencies  Cognitive Features That Contribute To Risk:  Polarized thinking    Suicide  Risk:  Minimal: No identifiable suicidal ideation.  Patients presenting with no risk factors but with morbid ruminations; may be classified as minimal risk based on the severity of the depressive symptoms  Principal Problem: Cocaine use disorder, moderate, dependence Discharge Diagnoses:  Patient Active Problem List   Diagnosis Date Noted  . Cocaine use disorder, moderate, dependence [F14.20] 09/17/2014    Priority: High  . Substance induced mood disorder [F19.94] 09/17/2014  . Polysubstance abuse [F19.10]   . Polysubstance dependence including opioid type drug, continuous use [F11.229] 12/06/2013  . Hepatitis C antibody test positive [R89.4] 03/13/2013  . Elevated transaminase level [R74.0] 03/13/2013      Plan Of Care/Follow-up recommendations:  Activity:  As tolerated Diet:  regular  Is patient on multiple antipsychotic therapies at discharge:  No   Has Patient had three or more failed trials of antipsychotic monotherapy by history:  No  Recommended Plan for Multiple Antipsychotic Therapies: NA    Josilyn Shippee C   PMHNP-BC 09/20/2014, 3:43 PM

## 2014-09-20 NOTE — ED Notes (Signed)
Dr J and Josephine NP into see 

## 2014-09-20 NOTE — ED Notes (Addendum)
Written dc instructions and rx x1 reviewed w/ pt.  Pt encouraged to take his antibiotics as directed and until competed.  Pt also encouraged to follow up after antibiotics are completed with his primary care MD.  Pt denies si/hi/avh at this time.  Pt ambulatory w/o difficulty to dc window w/ mHt.  Belongings returned after leaving the unit.

## 2014-09-20 NOTE — Consult Note (Signed)
  Psychiatric Specialty Exam: Physical Exam  ROS  Blood pressure 118/75, pulse 75, temperature 98.2 F (36.8 C), temperature source Oral, resp. rate 20, SpO2 96 %.There is no weight on file to calculate BMI.  General Appearance: Casual and Fairly Groomed  Patent attorney:: Good  Speech: Clear and Coherent and Normal Rate  Volume: Normal  Mood: Anxious  Affect: Congruent  Thought Process: Coherent, Goal Directed and Intact  Orientation: Full (Time, Place, and Person)  Thought Content: WDL  Suicidal Thoughts: No  Homicidal Thoughts: No  Memory: Immediate; Good Recent; Good Remote; Good  Judgement: Good  Insight: Good  Psychomotor Activity:NORMAL  Concentration: Good  Recall: NA  Fund of Knowledge:Good  Language: Good  Akathisia: NA  Handed: Right  AIMS (if indicated):    Assets: Desire for Improvement  ADL's: Intact  Cognition: WNL  Sleep:       Patient is calm and cooperative.  No withdrawal sweating noted today and patient denies any withdrawal symptoms.  He is drinking and eating.  His V/S remains stable.  Patient denies SI/HI/AVH.  Patient plans to continue Methadone treatment at [facility].  Patient is discharged home.  Cocaine use disorder, moderate, dependence   Plan: Discharge home, follow up with [facility] treatment facility.  Victor Gomez   PMHNP-BC  Patient seen face-to-face for the psychiatric consultation evaluation and [facility] long emergency department, case discussed with treatment team and the physician extender. Patient has been free from withdrawal symptoms of opiates and benzodiazepine. Will make appropriate and safe disposition plan. Reviewed the information documented and agree with the treatment plan.  Victor Gomez,Victor R. 09/20/2014 5:37 PM

## 2014-09-20 NOTE — ED Notes (Signed)
Up tot he bathroom to shower and change scrubs 

## 2014-12-20 ENCOUNTER — Encounter (HOSPITAL_COMMUNITY): Payer: Self-pay | Admitting: Emergency Medicine

## 2014-12-20 ENCOUNTER — Emergency Department (HOSPITAL_COMMUNITY)
Admission: EM | Admit: 2014-12-20 | Discharge: 2014-12-20 | Discharge status: Against Medical Advice | Payer: Self-pay | Attending: Emergency Medicine | Admitting: Emergency Medicine

## 2014-12-20 DIAGNOSIS — X19XXXA Contact with other heat and hot substances, initial encounter: Secondary | ICD-10-CM | POA: Insufficient documentation

## 2014-12-20 DIAGNOSIS — T2124XA Burn of second degree of lower back, initial encounter: Secondary | ICD-10-CM | POA: Insufficient documentation

## 2014-12-20 DIAGNOSIS — T401X1A Poisoning by heroin, accidental (unintentional), initial encounter: Secondary | ICD-10-CM | POA: Insufficient documentation

## 2014-12-20 DIAGNOSIS — Y998 Other external cause status: Secondary | ICD-10-CM | POA: Insufficient documentation

## 2014-12-20 DIAGNOSIS — Z79899 Other long term (current) drug therapy: Secondary | ICD-10-CM | POA: Insufficient documentation

## 2014-12-20 DIAGNOSIS — T405X1A Poisoning by cocaine, accidental (unintentional), initial encounter: Secondary | ICD-10-CM | POA: Insufficient documentation

## 2014-12-20 DIAGNOSIS — T50901A Poisoning by unspecified drugs, medicaments and biological substances, accidental (unintentional), initial encounter: Secondary | ICD-10-CM

## 2014-12-20 DIAGNOSIS — Y9289 Other specified places as the place of occurrence of the external cause: Secondary | ICD-10-CM | POA: Insufficient documentation

## 2014-12-20 DIAGNOSIS — Y9389 Activity, other specified: Secondary | ICD-10-CM | POA: Insufficient documentation

## 2014-12-20 DIAGNOSIS — T424X1A Poisoning by benzodiazepines, accidental (unintentional), initial encounter: Secondary | ICD-10-CM | POA: Insufficient documentation

## 2014-12-20 DIAGNOSIS — Z720 Tobacco use: Secondary | ICD-10-CM | POA: Insufficient documentation

## 2014-12-20 DIAGNOSIS — Z8619 Personal history of other infectious and parasitic diseases: Secondary | ICD-10-CM | POA: Insufficient documentation

## 2014-12-20 MED ORDER — SILVER SULFADIAZINE 1 % EX CREA
TOPICAL_CREAM | Freq: Once | CUTANEOUS | Status: AC
  Administered 2014-12-20: 22:00:00 via TOPICAL
  Filled 2014-12-20: qty 50
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
## 2014-12-20 NOTE — ED Notes (Signed)
Pt stopped from walking out through ambulance bay, pt unwilling to stay for d/c
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
## 2015-02-08 ENCOUNTER — Emergency Department (HOSPITAL_COMMUNITY)
Admission: EM | Admit: 2015-02-08 | Discharge: 2015-02-09 | Disposition: A | Discharge status: Other Acute Inpatient Hospital | Payer: Medicaid Other | Attending: Emergency Medicine | Admitting: Emergency Medicine

## 2015-02-08 ENCOUNTER — Encounter (HOSPITAL_COMMUNITY): Payer: Self-pay | Admitting: Emergency Medicine

## 2015-02-08 DIAGNOSIS — F1920 Other psychoactive substance dependence, uncomplicated: Secondary | ICD-10-CM

## 2015-02-08 DIAGNOSIS — R45851 Suicidal ideations: Secondary | ICD-10-CM

## 2015-02-08 DIAGNOSIS — Z590 Homelessness: Secondary | ICD-10-CM | POA: Insufficient documentation

## 2015-02-08 DIAGNOSIS — F1420 Cocaine dependence, uncomplicated: Secondary | ICD-10-CM | POA: Insufficient documentation

## 2015-02-08 DIAGNOSIS — F1010 Alcohol abuse, uncomplicated: Secondary | ICD-10-CM

## 2015-02-08 DIAGNOSIS — Z8619 Personal history of other infectious and parasitic diseases: Secondary | ICD-10-CM | POA: Insufficient documentation

## 2015-02-08 DIAGNOSIS — F1020 Alcohol dependence, uncomplicated: Secondary | ICD-10-CM | POA: Insufficient documentation

## 2015-02-08 DIAGNOSIS — F17210 Nicotine dependence, cigarettes, uncomplicated: Secondary | ICD-10-CM | POA: Insufficient documentation

## 2015-02-08 DIAGNOSIS — Z79899 Other long term (current) drug therapy: Secondary | ICD-10-CM | POA: Insufficient documentation

## 2015-02-08 LAB — COMPREHENSIVE METABOLIC PANEL
ALT: 33 U/L (ref 17–63)
AST: 44 U/L — AB (ref 15–41)
Albumin: 4.2 g/dL (ref 3.5–5.0)
Alkaline Phosphatase: 80 U/L (ref 38–126)
Anion gap: 10 (ref 5–15)
BUN: 7 mg/dL (ref 6–20)
CO2: 25 mmol/L (ref 22–32)
CREATININE: 0.97 mg/dL (ref 0.61–1.24)
Calcium: 9.6 mg/dL (ref 8.9–10.3)
Chloride: 105 mmol/L (ref 101–111)
Glucose, Bld: 113 mg/dL — ABNORMAL HIGH (ref 65–99)
Potassium: 4.3 mmol/L (ref 3.5–5.1)
Sodium: 140 mmol/L (ref 135–145)
Total Bilirubin: 0.6 mg/dL (ref 0.3–1.2)
Total Protein: 7.5 g/dL (ref 6.5–8.1)

## 2015-02-08 LAB — SALICYLATE LEVEL: Salicylate Lvl: <4 mg/dL (ref 2.8–30.0)

## 2015-02-08 LAB — RAPID URINE DRUG SCREEN, HOSP PERFORMED
AMPHETAMINES: NOT DETECTED
BARBITURATES: NOT DETECTED
Benzodiazepines: NOT DETECTED
Cocaine: POSITIVE — AB
OPIATES: NOT DETECTED
Tetrahydrocannabinol: NOT DETECTED

## 2015-02-08 LAB — CBC
HCT: 48.8 % (ref 39.0–52.0)
HEMOGLOBIN: 16.3 g/dL (ref 13.0–17.0)
MCH: 31.3 pg (ref 26.0–34.0)
MCHC: 33.4 g/dL (ref 30.0–36.0)
MCV: 93.8 fL (ref 78.0–100.0)
Platelets: 240 10*3/uL (ref 150–400)
RBC: 5.2 MIL/uL (ref 4.22–5.81)
RDW: 13.3 % (ref 11.5–15.5)
WBC: 10.6 10*3/uL — ABNORMAL HIGH (ref 4.0–10.5)

## 2015-02-08 LAB — ETHANOL

## 2015-02-08 LAB — ACETAMINOPHEN LEVEL: Acetaminophen (Tylenol), Serum: <10 ug/mL — ABNORMAL LOW (ref 10–30)

## 2015-02-08 MED ORDER — LORAZEPAM 1 MG PO TABS: 1.0000 mg | ORAL_TABLET | Freq: Three times a day (TID) | ORAL | Status: DC | PRN

## 2015-02-08 MED ORDER — HALOPERIDOL 5 MG PO TABS: 5.0000 mg | ORAL_TABLET | Freq: Two times a day (BID) | ORAL | Status: DC | PRN

## 2015-02-08 MED ORDER — IBUPROFEN 200 MG PO TABS: 600.0000 mg | ORAL_TABLET | Freq: Three times a day (TID) | ORAL | Status: DC | PRN

## 2015-02-08 MED ORDER — TRAZODONE HCL 50 MG PO TABS
25.0000 mg | ORAL_TABLET | Freq: Every day | ORAL | Status: DC
  Administered 2015-02-08: 25 mg via ORAL
  Filled 2015-02-08: qty 1

## 2015-02-08 MED ORDER — NICOTINE 21 MG/24HR TD PT24
21.0000 mg | MEDICATED_PATCH | Freq: Every day | TRANSDERMAL | Status: DC
  Administered 2015-02-08: 21 mg via TRANSDERMAL
  Filled 2015-02-08: qty 1

## 2015-02-08 MED ORDER — ZOLPIDEM TARTRATE 5 MG PO TABS: 5.0000 mg | ORAL_TABLET | Freq: Every evening | ORAL | Status: DC | PRN

## 2015-02-08 MED ORDER — ONDANSETRON HCL 4 MG PO TABS: 4.0000 mg | ORAL_TABLET | Freq: Three times a day (TID) | ORAL | Status: DC | PRN

## 2015-02-08 MED ORDER — ALUM & MAG HYDROXIDE-SIMETH 200-200-20 MG/5ML PO SUSP: 30.0000 mL | ORAL | Status: DC | PRN

## 2015-02-08 MED ORDER — THIAMINE HCL 100 MG/ML IJ SOLN: 100.0000 mg | Freq: Every day | INTRAMUSCULAR | Status: DC

## 2015-02-08 MED ORDER — DIPHENHYDRAMINE HCL 25 MG PO CAPS: 25.0000 mg | ORAL_CAPSULE | Freq: Four times a day (QID) | ORAL | Status: DC | PRN

## 2015-02-08 MED ORDER — LORAZEPAM 1 MG PO TABS
0.0000 mg | ORAL_TABLET | Freq: Four times a day (QID) | ORAL | Status: DC
  Administered 2015-02-08 (×3): 1 mg via ORAL
  Filled 2015-02-08 (×3): qty 1

## 2015-02-08 MED ORDER — LORAZEPAM 1 MG PO TABS: 0.0000 mg | ORAL_TABLET | Freq: Two times a day (BID) | ORAL | Status: DC

## 2015-02-08 MED ORDER — VITAMIN B-1 100 MG PO TABS
100.0000 mg | ORAL_TABLET | Freq: Every day | ORAL | Status: DC
Start: 2015-02-08 — End: 2015-02-09
  Administered 2015-02-08: 100 mg via ORAL
  Filled 2015-02-08: qty 1

## 2015-02-08 MED ORDER — ACETAMINOPHEN 325 MG PO TABS: 650.0000 mg | ORAL_TABLET | ORAL | Status: DC | PRN

## 2015-02-08 NOTE — ED Notes (Signed)
Pt AAO x 3, no distress noted, resting at present, calm & cooperative, monitoring for safety, Q 15 min checks in effect.

## 2015-02-08 NOTE — ED Provider Notes (Signed)
CSN: [id number]     Arrival date & time 02/08/15  1017 History   First MD Initiated Contact with Patient 02/08/15 1043     Chief Complaint  Patient presents with  . Suicidal  . Alcohol Problem     (Consider location/radiation/quality/duration/timing/severity/associated sxs/prior Treatment) HPI   Victor Gomez is a 39 y.o. male, with a history of Hepatitis C, presenting to the ED with suicidal ideations and help for drug and alcohol addiction. Pt states, "I feel like I'm just at the end of my rope and my life is falling apart." Pt states, "I was thinking about jumping out into traffic in front of a truck." Pt endorses feelings of sadness and hopelessness. Pt states he has felt this way for the past month. Pt adds that he is recently homeless. Pt was taking trazodone for sleep, but hasn't taken this for over a month because he states he can't afford it. Pt was previously on seroquel, but states he didn't like it because he felt he couldn't function. Pt does not have a regular mental health professional. Pt admits to using cocaine, heroine, and alcohol on a regular basis. Pt denies drug or alcohol use today. States that his last alcohol use was yesterday and last drug use was cocaine and that last use was "a few days ago." Patient states that he drinks 3-5 40 ounce beers a day. Pt denies ever making a suicide attempt in the past. Pt denies HI and A/V hallucinations. Pt states he no longer uses methadone.     Past Medical History  Diagnosis Date  . Methadone use (HCC)     clinic each day  . Hepatitis C    Past Surgical History  Procedure Laterality Date  . Hernia repair     No family history on file. Social History  Substance Use Topics  . Smoking status: Current Every Day Smoker -- 0.50 packs/day    Types: Cigarettes  . Smokeless tobacco: Never Used  . Alcohol Use: Yes    Review of Systems  Psychiatric/Behavioral: Positive for suicidal ideas and sleep disturbance. Negative for  hallucinations, behavioral problems and agitation. The patient is not nervous/anxious.   All other systems reviewed and are negative.     Allergies  Review of patient's allergies indicates no known allergies.  Home Medications   Prior to Admission medications   Medication Sig Start Date End Date Taking? Authorizing Provider  diphenhydrAMINE (BENADRYL) 25 mg capsule Take 25-50 mg by mouth every 6 (six) hours as needed for itching or allergies. Allergies/sinus drainage   Yes Historical Provider, MD  ibuprofen (ADVIL,MOTRIN) 200 MG tablet Take 400 mg by mouth every 6 (six) hours as needed for headache or moderate pain.   Yes Historical Provider, MD  traZODone (DESYREL) 50 MG tablet Take 25 mg by mouth daily. 10/26/14  Yes Historical Provider, MD   BP 116/88 mmHg  Pulse 81  Temp(Src) 98.6 F (37 C) (Oral)  Resp 18  SpO2 100% Physical Exam  Constitutional: He is oriented to person, place, and time. He appears well-developed and well-nourished. No distress.  HENT:  Head: Normocephalic and atraumatic.  Eyes: Conjunctivae and EOM are normal. Pupils are equal, round, and reactive to light.  Neck: Normal range of motion. Neck supple.  Cardiovascular: Normal rate, regular rhythm, normal heart sounds and intact distal pulses.   Pulmonary/Chest: Effort normal and breath sounds normal. No respiratory distress.  Abdominal: Soft. Bowel sounds are normal.  Musculoskeletal: He exhibits no edema or tenderness.  Lymphadenopathy:    He has no cervical adenopathy.  Neurological: He is alert and oriented to person, place, and time. He has normal reflexes.  Skin: Skin is warm and dry. He is not diaphoretic.  Psychiatric: He has a normal mood and affect. His behavior is normal.  Nursing note and vitals reviewed.   ED Course  Procedures (including critical care time) Labs Review Labs Reviewed  COMPREHENSIVE METABOLIC PANEL - Abnormal; Notable for the following:    Glucose, Bld 113 (*)    AST 44  (*)    All other components within normal limits  ACETAMINOPHEN LEVEL - Abnormal; Notable for the following:    Acetaminophen (Tylenol), Serum <10 (*)    All other components within normal limits  CBC - Abnormal; Notable for the following:    WBC 10.6 (*)    All other components within normal limits  URINE RAPID DRUG SCREEN, HOSP PERFORMED - Abnormal; Notable for the following:    Cocaine POSITIVE (*)    All other components within normal limits  ETHANOL  SALICYLATE LEVEL    Imaging Review No results found. I have personally reviewed and evaluated these images and lab results as part of my medical decision-making.   EKG Interpretation None      MDM   Final diagnoses:  Suicidal ideations  Alcohol abuse  Drug abuse and dependence (HCC)    Victor Spray presents with suicidal ideations and is observe for alcohol and drug rehabilitation.  Patient answered all interview questions without hesitation and with no response abnormalities or inconsistencies. Patient has been medically cleared and placed in psych hold. Home medications were ordered. The process was explained to the patient, who voiced understanding of this information.    Anselm Pancoast, PA-C 02/08/15 2019  Bethann Berkshire, MD 02/11/15 1030

## 2015-02-08 NOTE — BH Assessment (Signed)
Assessment Note  Victor Gomez is an 39 y.o. male. Patient was brought into the ED by mother because of suicidal ideations and drug use.  Patient continues to endorse suicidal ideations with a plan to jump in traffic.  On admission patient reported to medication he was contemplating hanging himself. "I can't stand it no longer". Patient currently denies HI, A.VH, and other self injurious behaviors.  Patient reports the holiday has increased his symptoms of depression because of his inability to visit with his daughter and of other relationship conflicts with family members.    Patient reports his substance use includes alcohol, daily, since age 81, 2-4 40oz beers, and last used 12/26.  Patient reports using cocaine(crack), since his 20's, $10-20, and last used 2 days ago. Patient has a history of heroin use but last used in August of 2016.  Patient reports previous treatment history at [facility] five months ago and [facility] for methadone treatment in 2014.   Patient reports having an upcoming traffic ticket court date on 02/25/2015.    This Clinical research associate consulted with Catha Nottingham, NP it is recommended to refer for inpatient treatment.    Diagnosis: Substance Induced Mood Disorder; Alcohol use, moderate; Cocaine use, moderate  Past Medical History:  Past Medical History  Diagnosis Date  . Methadone use (HCC)     clinic each day  . Hepatitis C     Past Surgical History  Procedure Laterality Date  . Hernia repair      Family History: No family history on file.  Social History:  reports that he has been smoking Cigarettes.  He has been smoking about 0.50 packs per day. He has never used smokeless tobacco. He reports that he drinks alcohol. He reports that he uses illicit drugs (Marijuana).  Additional Social History:  Alcohol / Drug Use Pain Medications: see chart Prescriptions: see chart Over the Counter: see chart History of alcohol / drug use?: Yes Longest period of sobriety (when/how long): 2  months Negative Consequences of Use: Financial, Legal, Personal relationships, Work / School Withdrawal Symptoms: Agitation, Sweats, Tingling, Tremors, Irritability, Weakness, Patient aware of relationship between substance abuse and physical/medical complications Substance #1 Name of Substance 1: Alcohol 1 - Age of First Use: 17 1 - Amount (size/oz): 2-4 40oz beers 1 - Frequency: daily 1 - Last Use / Amount: 12/25 Substance #2 Name of Substance 2: Cocaine 2 - Age of First Use: 20s 2 - Amount (size/oz): $10-20 2 - Frequency: 4-5 times monthly 2 - Last Use / Amount: 2 days ago  CIWA: CIWA-Ar BP: 143/95 mmHg Pulse Rate: 96 Nausea and Vomiting: no nausea and no vomiting Tactile Disturbances: none Tremor: moderate, with patient's arms extended Auditory Disturbances: not present Paroxysmal Sweats: barely perceptible sweating, palms moist Visual Disturbances: not present Anxiety: moderately anxious, or guarded, so anxiety is inferred Headache, Fullness in Head: none present Agitation: normal activity Orientation and Clouding of Sensorium: oriented and can do serial additions CIWA-Ar Total: 9 COWS:    Allergies: No Known Allergies  Home Medications:  (Not in a hospital admission)  OB/GYN Status:  No LMP for male patient.  General Assessment Data Location of Assessment: [facility] TTS Assessment: In system Is this a Tele or Face-to-Face Assessment?: Face-to-Face Is this an Initial Assessment or a Re-assessment for this encounter?: Initial Assessment Marital status: Single Maiden name: na Is patient pregnant?: No Pregnancy Status: No Living Arrangements: Other (Comment) (homeless) Can pt return to current living arrangement?: Yes Admission Status: Voluntary Is patient capable  of signing voluntary admission?: Yes Referral Source: Self/Family/Friend Insurance type: none  Medical Screening Exam [facility] Walk-in ONLY) Medical Exam completed: Yes  Crisis Care Plan Living  Arrangements: Other (Comment) (homeless) Name of Psychiatrist: none Name of Therapist: none  Education Status Is patient currently in school?: No Current Grade: na Highest grade of school patient has completed: 9th Name of school: na Contact person: na  Risk to self with the past 6 months Suicidal Ideation: Yes-Currently Present Has patient been a risk to self within the past 6 months prior to admission? : Yes Suicidal Intent: Yes-Currently Present Has patient had any suicidal intent within the past 6 months prior to admission? : Yes Is patient at risk for suicide?: Yes Suicidal Plan?: Yes-Currently Present Has patient had any suicidal plan within the past 6 months prior to admission? : Yes Access to Means: Yes Specify Access to Suicidal Means: hang self, jump in traffic What has been your use of drugs/alcohol within the last 12 months?: alcohol, cocaine Previous Attempts/Gestures: No How many times?: 0 Other Self Harm Risks: na Intentional Self Injurious Behavior: None Family Suicide History: No Recent stressful life event(s): Conflict (Comment), Loss (Comment), Financial Problems, Legal Issues, Other (Comment) (SA) Persecutory voices/beliefs?: No Depression: Yes Depression Symptoms: Guilt, Loss of interest in usual pleasures, Feeling worthless/self pity, Feeling angry/irritable (hopelessness) Substance abuse history and/or treatment for substance abuse?: Yes  Risk to Others within the past 6 months Homicidal Ideation: No-Not Currently/Within Last 6 Months Does patient have any lifetime risk of violence toward others beyond the six months prior to admission? : No Thoughts of Harm to Others: No-Not Currently Present/Within Last 6 Months Current Homicidal Intent: No-Not Currently/Within Last 6 Months Current Homicidal Plan: No-Not Currently/Within Last 6 Months Access to Homicidal Means: No Identified Victim: na History of harm to others?: No Assessment of Violence: None  Noted Violent Behavior Description: na Does patient have access to weapons?: No Criminal Charges Pending?: No Does patient have a court date: No Is patient on probation?: No  Psychosis Hallucinations: None noted Delusions: None noted  Mental Status Report Appearance/Hygiene: In hospital gown Eye Contact: Fair Motor Activity: Freedom of movement Speech: Logical/coherent Level of Consciousness: Alert Mood: Anxious Affect: Anxious Anxiety Level: Minimal Thought Processes: Relevant Judgement: Unimpaired Orientation: Person, Place, Time, Situation Obsessive Compulsive Thoughts/Behaviors: None  Cognitive Functioning Concentration: Normal Memory: Recent Intact, Remote Intact IQ: Average Insight: Fair Impulse Control: Fair Appetite: Fair Weight Loss: 0 Weight Gain: 0 Sleep: Decreased Total Hours of Sleep: 4 Vegetative Symptoms: None  ADLScreening [facility] Assessment Services) Patient's cognitive ability adequate to safely complete daily activities?: Yes Patient able to express need for assistance with ADLs?: Yes Independently performs ADLs?: Yes (appropriate for developmental age)  Prior Inpatient Therapy Prior Inpatient Therapy: Yes Prior Therapy Dates: 2016 Prior Therapy Facilty/Provider(s): [facility] Reason for Treatment: SA  Prior Outpatient Therapy Prior Outpatient Therapy: Yes Prior Therapy Dates: 2014 Prior Therapy Facilty/Provider(s): [facility] Reason for Treatment: methadone tx Does patient have an ACCT team?: No Does patient have Intensive In-House Services?  : No Does patient have [facility] services? : No Does patient have P4CC services?: No  ADL Screening (condition at time of admission) Patient's cognitive ability adequate to safely complete daily activities?: Yes Patient able to express need for assistance with ADLs?: Yes Independently performs ADLs?: Yes (appropriate for developmental age)       Abuse/Neglect Assessment (Assessment to be complete while patient  is alone) Physical Abuse: Denies Verbal Abuse: Denies Sexual Abuse: Denies Exploitation of patient/patient's  resources: Denies Self-Neglect: Denies Values / Beliefs Cultural Requests During Hospitalization: None Spiritual Requests During Hospitalization: None Consults Spiritual Care Consult Needed: No Social Work Consult Needed: No Merchant navy officerAdvance Directives (For Healthcare) Does patient have an advance directive?: No    Additional Information 1:1 In Past 12 Months?: No CIRT Risk: No Elopement Risk: No Does patient have medical clearance?: Yes     Disposition:  Disposition Initial Assessment Completed for this Encounter: Yes Disposition of Patient: Inpatient treatment program Type of inpatient treatment program: Adult  On Site Evaluation by:   Reviewed with Physician:    Maryelizabeth Rowanorbett, Aliene Tamura A 02/08/2015 12:07 PM

## 2015-02-08 NOTE — ED Notes (Signed)
Pt states that he is suicidal with plan to hang himself.  Pt states that he is an alcoholic who drinks 4-5 40 oz per day.  Last drink was last night.  Has been through detox before but denies seizure activity.  Pt denies AV hallucinations.

## 2015-02-09 ENCOUNTER — Inpatient Hospital Stay (HOSPITAL_COMMUNITY)
Admission: EM | Admit: 2015-02-09 | Discharge: 2015-02-15 | DRG: 885 | Disposition: A | Discharge status: Home / Self Care | Payer: Federal, State, Local not specified - Other | Source: Intra-hospital | Attending: Psychiatry | Admitting: Psychiatry

## 2015-02-09 ENCOUNTER — Encounter (HOSPITAL_COMMUNITY): Payer: Self-pay

## 2015-02-09 DIAGNOSIS — F339 Major depressive disorder, recurrent, unspecified: Secondary | ICD-10-CM | POA: Diagnosis not present

## 2015-02-09 DIAGNOSIS — R45851 Suicidal ideations: Secondary | ICD-10-CM | POA: Diagnosis present

## 2015-02-09 DIAGNOSIS — F331 Major depressive disorder, recurrent, moderate: Secondary | ICD-10-CM | POA: Diagnosis not present

## 2015-02-09 DIAGNOSIS — F332 Major depressive disorder, recurrent severe without psychotic features: Secondary | ICD-10-CM | POA: Diagnosis not present

## 2015-02-09 DIAGNOSIS — F17210 Nicotine dependence, cigarettes, uncomplicated: Secondary | ICD-10-CM | POA: Diagnosis present

## 2015-02-09 DIAGNOSIS — F1120 Opioid dependence, uncomplicated: Secondary | ICD-10-CM | POA: Diagnosis not present

## 2015-02-09 DIAGNOSIS — F1994 Other psychoactive substance use, unspecified with psychoactive substance-induced mood disorder: Secondary | ICD-10-CM | POA: Diagnosis not present

## 2015-02-09 DIAGNOSIS — Z590 Homelessness: Secondary | ICD-10-CM | POA: Diagnosis not present

## 2015-02-09 DIAGNOSIS — F1920 Other psychoactive substance dependence, uncomplicated: Secondary | ICD-10-CM

## 2015-02-09 DIAGNOSIS — F15959 Other stimulant use, unspecified with stimulant-induced psychotic disorder, unspecified: Secondary | ICD-10-CM | POA: Diagnosis present

## 2015-02-09 LAB — HEPATIC FUNCTION PANEL
ALBUMIN: 4.3 g/dL (ref 3.5–5.0)
ALT: 29 U/L (ref 17–63)
AST: 41 U/L (ref 15–41)
Alkaline Phosphatase: 73 U/L (ref 38–126)
TOTAL PROTEIN: 7.3 g/dL (ref 6.5–8.1)
Total Bilirubin: 0.3 mg/dL (ref 0.3–1.2)

## 2015-02-09 MED ORDER — HYDROXYZINE HCL 25 MG PO TABS
25.0000 mg | ORAL_TABLET | Freq: Four times a day (QID) | ORAL | Status: DC | PRN
  Administered 2015-02-09 – 2015-02-15 (×13): 25 mg via ORAL
  Filled 2015-02-09 (×13): qty 1
  Filled 2015-02-09: qty 5

## 2015-02-09 MED ORDER — MAGNESIUM HYDROXIDE 400 MG/5ML PO SUSP: 30.0000 mL | Freq: Every day | ORAL | Status: DC | PRN

## 2015-02-09 MED ORDER — TRAZODONE HCL 50 MG PO TABS: 50.0000 mg | ORAL_TABLET | Freq: Every evening | ORAL | Status: DC | PRN

## 2015-02-09 MED ORDER — NICOTINE 21 MG/24HR TD PT24
21.0000 mg | MEDICATED_PATCH | Freq: Every day | TRANSDERMAL | Status: DC
  Administered 2015-02-09 – 2015-02-15 (×7): 21 mg via TRANSDERMAL
  Filled 2015-02-09 (×9): qty 1

## 2015-02-09 MED ORDER — TRAZODONE HCL 50 MG PO TABS
50.0000 mg | ORAL_TABLET | Freq: Every evening | ORAL | Status: DC | PRN
  Administered 2015-02-09 – 2015-02-14 (×4): 50 mg via ORAL
  Filled 2015-02-09 (×2): qty 1
  Filled 2015-02-09: qty 7
  Filled 2015-02-09 (×2): qty 1

## 2015-02-09 MED ORDER — ALUM & MAG HYDROXIDE-SIMETH 200-200-20 MG/5ML PO SUSP
30.0000 mL | ORAL | Status: DC | PRN
  Administered 2015-02-10: 30 mL via ORAL
  Filled 2015-02-09: qty 30

## 2015-02-09 MED ORDER — ACETAMINOPHEN 325 MG PO TABS: 650.0000 mg | ORAL_TABLET | Freq: Four times a day (QID) | ORAL | Status: DC | PRN

## 2015-02-09 MED ORDER — ALUM & MAG HYDROXIDE-SIMETH 200-200-20 MG/5ML PO SUSP: 30.0000 mL | ORAL | Status: DC | PRN

## 2015-02-09 NOTE — Tx Team (Addendum)
Initial Interdisciplinary Treatment Plan   PATIENT STRESSORS: Marital or family conflict Medication change or noncompliance Substance abuse   PATIENT STRENGTHS: General fund of knowledge Motivation for treatment/growth   PROBLEM LIST: Problem List/Patient Goals Date to be addressed Date deferred Reason deferred Estimated date of resolution  I need help with my drinking and my depression"      Depression      Substance Abuse      Risk for Suicide                                     DISCHARGE CRITERIA:  Improved stabilization in mood, thinking, and/or behavior Verbal commitment to aftercare and medication compliance Withdrawal symptoms are absent or subacute and managed without 24-hour nursing intervention  PRELIMINARY DISCHARGE PLAN: Attend 12-step recovery group Placement in alternative living arrangements  PATIENT/FAMIILY INVOLVEMENT: This treatment plan has been presented to and reviewed with the patient, Teodoro SprayJason L Nick, and/or family member, .  The patient and family have been given the opportunity to ask questions and make suggestions.  Andrena Mewsuttall, Penny J 02/09/2015, 5:18 AM

## 2015-02-09 NOTE — Tx Team (Signed)
Interdisciplinary Treatment Plan Update (Adult)  Date:  02/09/2015  Time Reviewed:  8:48 AM   Progress in Treatment: Attending groups: No. Participating in groups:  No. Taking medication as prescribed:  Yes. Tolerating medication:  Yes. Family/Significant othe contact made:  SPE required for this pt.  Patient understands diagnosis:  Yes. and As evidenced by:  seeking treatment for Si, depression, med stabilization, crack cocaine abuse, alcohol abuse.  Discussing patient identified problems/goals with staff:  Yes. Medical problems stabilized or resolved:  Yes. Denies suicidal/homicidal ideation: No. Passive Si/Able to contract for safety on the unit.  Issues/concerns per patient self-inventory:  Other:  Discharge Plan or Barriers: CSW assessing for appropriate referrals.   Reason for Continuation of Hospitalization: Depression Medication stabilization Suicidal ideation Withdrawal symptoms  Comments:  Victor Gomez is an 39 y.o. male. Patient was brought into the ED by mother because of suicidal ideations and drug use. Patient continues to endorse suicidal ideations with a plan to jump in traffic. On admission patient reported to medication he was contemplating hanging himself. "I can't stand it no longer". Patient currently denies HI, A.VH, and other self injurious behaviors. Patient reports the holiday has increased his symptoms of depression because of his inability to visit with his daughter and of other relationship conflicts with family members. Patient reports his substance use includes alcohol, daily, since age 71, 2-4 40oz beers, and last used 12/26. Patient reports using cocaine(crack), since his 20's, $10-20, and last used 2 days ago. Patient has a history of heroin use but last used in August of 2016. Patient reports previous treatment history at [facility] five months ago and [facility] for methadone treatment in 2014. Patient reports having an upcoming traffic ticket court date on  02/25/2015. Diagnosis: Substance Induced Mood Disorder; Alcohol use, moderate; Cocaine use, moderate  Estimated length of stay:  3-5 days   New goal(s): to develop effective aftercare plan.   Additional Comments:  Patient and CSW reviewed pt's identified goals and treatment plan. Patient verbalized understanding and agreed to treatment plan. CSW reviewed [facility] "Discharge Process and Patient Involvement" Form. Pt verbalized understanding of information provided and signed form.    Review of initial/current patient goals per problem list:  1. Goal(s): Patient will participate in aftercare plan  Met: No.   Target date: at discharge  As evidenced by: Patient will participate within aftercare plan AEB aftercare provider and housing plan at discharge being identified.  12/27: CSW assessing for appropriate referrals.   2. Goal (s): Patient will exhibit decreased depressive symptoms and suicidal ideations.  Met: No.    Target date: at discharge  As evidenced by: Patient will utilize self rating of depression at 3 or below and demonstrate decreased signs of depression or be deemed stable for discharge by MD.  12/27: Pt rates depression as high. Denies Si/HI/AVh.    3. Goal(s): Patient will demonstrate decreased signs of withdrawal due to substance abuse  Met:No.   Target date:at discharge   As evidenced by: Patient will produce a CIWA/COWS score of 0, have stable vitals signs, and no symptoms of withdrawal.  12/27: Pt reports CIWA score of 5 and stable vitals.    Attendees: Patient:   02/09/2015 8:48 AM   Family:   02/09/2015 8:48 AM   Physician:  Dr. Parke Poisson   02/09/2015 8:48 AM   Nursing:   Vallery Ridge; Patrice RN 02/09/2015 8:48 AM   Clinical Social Worker: Maxie Better, LCSW 02/09/2015 8:48 AM   Clinical Social Worker: Erasmo Downer  Drinkard Latanya PresserPeri Maris LCSWA 02/09/2015 8:48 AM   Other:  Gerline Legacy Nurse Case Manager 02/09/2015 8:48 AM   Other:  02/09/2015  8:48 AM   Other:   02/09/2015 8:48 AM   Other:  02/09/2015 8:48 AM   Other:  02/09/2015 8:48 AM   Other:  02/09/2015 8:48 AM    02/09/2015 8:48 AM    02/09/2015 8:48 AM    02/09/2015 8:48 AM    02/09/2015 8:48 AM    Scribe for Treatment Team:   Maxie Better, LCSW 02/09/2015 8:48 AM
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
## 2015-02-09 NOTE — Progress Notes (Signed)
Pt is a 39 year old male admitted with suicidal ideation and chemical dependency    His drugs of choice are ETOH crack and Pot   Pt reports being suicidal with a plan to jump into traffic or hang himself    He is stressed out because he cannot visit his daughter and other family conflicts over his drug use    He has a court date for a traffic ticket 02/25/15   Pt is cooperative but sleepy during the assessment    He has a place on the right side of his forehead at the hairline he said has been there for years that is scaly and red that he would like to have looked at while here and he has one tatoo on his arm  Pt was offered nourishment declined food but did take some gingerale  He complains of withdrawal symptoms of increased anxiety   [facility] but has been in several treatment centers before   Pt was oriented to the unit and given vistaril to help his anxiety   Q 15 min checks initiated and explained   Pt is adjusting well

## 2015-02-09 NOTE — Progress Notes (Signed)
D: Pt presents with flat affect and depressed mood. Pt depressed because he's not able to spend time with his daughter d/t his drug addiction.  Pt stated that he's not suicidal this morning. Pt verbally contracts not to harm self. Pt denies withdrawal symptoms from alcohol or cocaine. Pt have been sleeping all morning. Pt requested nicotine patch. Nicotine patch applied. Pt has not attended any groups this morning.  A: Medications reviewed by Clinical research associatewriter. Pt encouraged to attend groups. Verbal support provided. 15 minute checks performed for safety.  R: Pt receptive to tx.

## 2015-02-09 NOTE — ED Notes (Signed)
Report called to RN Penny, [facility].  Pending [facility] transport. 

## 2015-02-09 NOTE — [facility] Group Notes (Signed)
[facility] LCSW Group Therapy  Type of Therapy:  Group Therapy  Participation Level:  Active  Participation Quality:  Appropriate and Attentive  Affect:  Appropriate  Cognitive:  Alert, Appropriate and Oriented  Insight:  Developing/Improving  Engagement in Therapy:  Developing/Improving  Modes of Intervention:  Discussion, Education, Exploration, Orientation and Support  Summary of Progress/Problems: MHA Speaker came to talk about his personal journey with substance abuse and addiction. The pt processed ways by which to relate to the speaker. MHA speaker provided handouts and educational information pertaining to groups and services offered by [facility].   Otilio SaberKidd, Mortimer Bair M 02/09/2015, 2:45 PM

## 2015-02-09 NOTE — Progress Notes (Signed)
Patient attended AA group.   
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
## 2015-02-09 NOTE — ED Notes (Signed)
[facility] transport requested. 

## 2015-02-10 DIAGNOSIS — F339 Major depressive disorder, recurrent, unspecified: Secondary | ICD-10-CM

## 2015-02-10 MED ORDER — IBUPROFEN 600 MG PO TABS
600.0000 mg | ORAL_TABLET | Freq: Four times a day (QID) | ORAL | Status: DC | PRN
  Administered 2015-02-10 – 2015-02-14 (×4): 600 mg via ORAL
  Filled 2015-02-10 (×4): qty 1

## 2015-02-10 MED ORDER — QUETIAPINE FUMARATE 100 MG PO TABS
100.0000 mg | ORAL_TABLET | Freq: Every day | ORAL | Status: DC
  Administered 2015-02-10 – 2015-02-14 (×5): 100 mg via ORAL
  Filled 2015-02-10 (×3): qty 1
  Filled 2015-02-10: qty 7
  Filled 2015-02-10 (×4): qty 1

## 2015-02-10 NOTE — Progress Notes (Signed)
D   Pt is pleasant on approach and cooperative   He interacts well with others and is compliant with treatment    He denies withdrawal symptoms except for increased anxiety A   Verbal support given   Medications administered and effectiveness monitored   Q 15 min checks R    Pt is safe at present

## 2015-02-10 NOTE — Progress Notes (Signed)
Pt attended the NA group meeting.  

## 2015-02-10 NOTE — [facility] Group Notes (Signed)
[facility] LCSW Group Therapy  02/10/2015 12:52 PM  Type of Therapy:  Group Therapy  Participation Level:  Active  Participation Quality:  Attentive  Affect:  Appropriate  Cognitive:  Alert and Oriented  Insight:  Developing/Improving  Engagement in Therapy:  Developing/Improving  Modes of Intervention:  Confrontation, Discussion, Education, Exploration, Problem-solving, Rapport Building, Socialization and Support  Summary of Progress/Problems: Today's Topic: Overcoming Obstacles. Patients identified one short term goal and potential obstacles in reaching this goal. Patients processed barriers involved in overcoming these obstacles. Patients identified steps necessary for overcoming these obstacles and explored motivation (internal and external) for facing these difficulties head on. Victor Gomez was observed to be attentive in group as he discussed his obstacle of not having a support system. He reported his desire to overcome his obstacle by connecting with NA and others who can continue keep him motivated.   Victor ScullGregory Pickett LCSW 02/10/2015, 12:52 PM
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
## 2015-02-10 NOTE — Progress Notes (Signed)
D: Pt presents with flat affect and depressed mood. Pt rates depression 8/10. Anxiety 9/10. Hopeless 9/10. Pt endorses passive suicidal thoughts. Pt verbally contracts not to harm self. Pt reports poor sleep last night. Pt requesting Seroquel 100 mg at HS for anxiety and sleep. Pt started on Seroquel at bedtime per MD. Pt reports that he's seeking long-term substance abuse tx. A: Medications administered as ordered per MD. Verbal support given. Pt encouraged to attend groups. 15 minute checks performed for safety. R: Pt receptive to tx.

## 2015-02-11 NOTE — Progress Notes (Signed)
Patient ID: [name], male   DOB: 08/30/1975, 39 y.o.   MRN: [id number] D-Complains of anxiety which is worse in here.He was given Atarax once for anxiety. States he has a lot on him. Concerned with court issues he has and his court date is the 12th of Jan. He states its something small but he is worried it will effect his ability to get [facility] he [facility] to [facility]. He states in the past he was able to get his court date moved, but with it being so close, he is unsure.  Denies current SI now.  A-Support offered. Monitored for safety and medications as ordered. R-No complaints at this time. Attending groups. Isolative to self.

## 2015-02-11 NOTE — Progress Notes (Signed)
Adult Psychoeducational Group Note  Date:  02/11/2015 Time:  9:04 PM  Group Topic/Focus:  Wrap-Up Group:   The focus of this group is to help patients review their daily goal of treatment and discuss progress on daily workbooks.  Participation Level:  Active  Participation Quality:  Attentive  Affect:  Appropriate  Cognitive:  Appropriate  Insight: Appropriate  Engagement in Group:  Engaged  Modes of Intervention:  Education  Additional Comments:  Pt had a good day.  Pt missed group and his goal for tomorrow is to get in touch with the district attorney, to get his court date Change.   Merlinda FrederickKeshia S Samyia Motter 02/11/2015, 9:04 PM

## 2015-02-11 NOTE — [facility] Group Notes (Signed)
[facility] LCSW Group Therapy  02/11/2015 12:50 PM  Type of Therapy:  Group Therapy  Participation Level:  Active  Participation Quality:  Attentive  Affect:  Appropriate  Cognitive:  Alert and Oriented  Insight:  Improving  Engagement in Therapy:  Improving  Modes of Intervention:  Confrontation, Discussion, Education, Exploration, Problem-solving, Rapport Building, Socialization and Support  Summary of Progress/Problems:  Finding Balance in Life. Today's group focused on defining balance in one's own words, identifying things that can knock one off balance, and exploring healthy ways to maintain balance in life. Group members were asked to provide an example of a time when they felt off balance, describe how they handled that situation,and process healthier ways to regain balance in the future. Group members were asked to share the most important tool for maintaining balance that they [facility] and how they plan to apply this method after discharge. [name] was attentive and engaged during today's processing group. He shared that he feels like he is approaching balance "I'm waiting to see if my court date can be continued so that I can go to treatment." [name] stated that he is anxious to get things taken care of so that he can learn some better coping skills, continuing treatment for mental health/substance abuse issues, and get his life together.    Smart, Calyse Murcia LCSW 02/11/2015, 12:50 PM

## 2015-02-11 NOTE — Progress Notes (Signed)
Patient ID: Victor Gomez, male   DOB: 11/05/1975, 39 y.o.   MRN: [id number] Completed and turned in to writer his self inventory late in the afternoon. He rates his feelings of hopelessness a 9 depression a 7 and anxiety an 8. He denies current thoughts of suicide but states he has those thoughts sometimes.

## 2015-02-11 NOTE — Progress Notes (Signed)
[facility]  02/11/2015 7:44 PM [name]  MRN:  [id number] Subjective:  [name] states that he slept better with the Seroquel. States that he is very motivated to pursue the residential treatment program. States that he really wants to be a father to his daughter. His wife has told him that if he was to get his life back together, get a job, have his own place she would not be opposed for her daughter to stay with him. States he cant understand what is the pull this drugs have on him. He also admits he is easily convinced to use when he is around people using and there is where he gravitates. Once he starts he cant stop Principal Problem: <principal problem not specified> Diagnosis:   Patient Active Problem List   Diagnosis Date Noted  . Major depressive disorder, recurrent episode (HCC) [F33.9] 02/09/2015  . Cocaine use disorder, moderate, dependence (HCC) [F14.20] 09/17/2014  . Substance induced mood disorder (HCC) [F19.94] 09/17/2014  . Polysubstance abuse [F19.10]   . Polysubstance dependence including opioid type drug, continuous use (HCC) [F11.20, F19.20] 12/06/2013  . Hepatitis C antibody test positive [R89.4] 03/13/2013  . Elevated transaminase level [R74.0] 03/13/2013   Total Time spent with patient: 20 minutes  Past Psychiatric History: see admission H and P  Past Medical History:  Past Medical History  Diagnosis Date  . Methadone use (HCC)     clinic each day  . Hepatitis C     Past Surgical History  Procedure Laterality Date  . Hernia repair     Family History: History reviewed. No pertinent family history. Family Psychiatric  History: see admission H and P Social History:  History  Alcohol Use  . Yes     History  Drug Use  . Yes  . Special: Marijuana    Comment: heroin use, last use 10/15 Meth, opiates    Social History   Social History  . Marital Status: Legally Separated    Spouse Name: N/A  . Number of Children: N/A  . Years of  Education: N/A   Social History Main Topics  . Smoking status: Current Every Day Smoker -- 0.50 packs/day    Types: Cigarettes  . Smokeless tobacco: Never Used  . Alcohol Use: Yes  . Drug Use: Yes    Special: Marijuana     Comment: heroin use, last use 10/15 Meth, opiates  . Sexual Activity: Not Asked   Other Topics Concern  . None   Social History Narrative   Additional Social History:    Pain Medications: see chart Prescriptions: see chart Over the Counter: see chart History of alcohol / drug use?: Yes Longest period of sobriety (when/how long): 2 months Negative Consequences of Use: Financial, Armed forces operational officerLegal, Personal relationships, Work / Programmer, multimediachool Withdrawal Symptoms: Agitation, Sweats, Tingling, Tremors, Irritability, Weakness, Patient aware of relationship between substance abuse and physical/medical complications Name of Substance 1: Alcohol 1 - Age of First Use: 17 1 - Amount (size/oz): 2-4 40oz beers 1 - Frequency: daily 1 - Last Use / Amount: 12/25 Name of Substance 2: Cocaine 2 - Age of First Use: 20s 2 - Amount (size/oz): $10-20 2 - Frequency: 4-5 times monthly 2 - Last Use / Amount: 2 days ago                Sleep: Fair  Appetite:  Fair  Current Medications: Current Facility-Administered Medications  Medication Dose Route Frequency Provider Last Rate Last Dose  . alum & mag  hydroxide-simeth (MAALOX/MYLANTA) 200-200-20 MG/5ML suspension 30 mL  30 mL Oral Q4H PRN Cleotis Nipper, MD   30 mL at 02/10/15 1643  . hydrOXYzine (ATARAX/VISTARIL) tablet 25 mg  25 mg Oral Q6H PRN Cleotis Nipper, MD   25 mg at 02/11/15 1322  . ibuprofen (ADVIL,MOTRIN) tablet 600 mg  600 mg Oral Q6H PRN Rachael Fee, MD   600 mg at 02/10/15 1643  . magnesium hydroxide (MILK OF MAGNESIA) suspension 30 mL  30 mL Oral Daily PRN Sanjuana Kava, NP      . nicotine (NICODERM CQ - dosed in mg/24 hours) patch 21 mg  21 mg Transdermal Daily Craige Cotta, MD   21 mg at 02/11/15 0835  . QUEtiapine  (SEROQUEL) tablet 100 mg  100 mg Oral QHS Rachael Fee, MD   100 mg at 02/10/15 2229  . traZODone (DESYREL) tablet 50 mg  50 mg Oral QHS PRN Sanjuana Kava, NP   50 mg at 02/09/15 2156    Lab Results: No results found for this or any previous visit (from the past 48 hour(s)).  Physical Findings: AIMS: Facial and Oral Movements Muscles of Facial Expression: None, normal Lips and Perioral Area: None, normal Jaw: None, normal Tongue: None, normal,Extremity Movements Upper (arms, wrists, hands, fingers): None, normal Lower (legs, knees, ankles, toes): None, normal, Trunk Movements Neck, shoulders, hips: None, normal, Overall Severity Severity of abnormal movements (highest score from questions above): None, normal Incapacitation due to abnormal movements: None, normal Patient's awareness of abnormal movements (rate only patient's report): No Awareness, Dental Status Current problems with teeth and/or dentures?: No Does patient usually wear dentures?: No  CIWA:  CIWA-Ar Total: 5 COWS:     Musculoskeletal: Strength & Muscle Tone: within normal limits Gait & Station: normal Patient leans: normal  Psychiatric Specialty Exam: Review of Systems  Constitutional: Negative.   HENT: Negative.   Eyes: Negative.   Respiratory: Negative.   Cardiovascular: Negative.   Gastrointestinal: Negative.   Genitourinary: Negative.   Musculoskeletal: Positive for joint pain.  Skin: Negative.   Neurological: Negative.   Endo/Heme/Allergies: Negative.   Psychiatric/Behavioral: Positive for depression and substance abuse. The patient is nervous/anxious.     Blood pressure 111/74, pulse 80, temperature 98.7 F (37.1 C), temperature source Oral, resp. rate 14, height 5' 11.5" (1.816 m), weight 83.915 kg (185 lb).Body mass index is 25.45 kg/(m^2).  General Appearance: Fairly Groomed  Patent attorney::  Fair  Speech:  Clear and Coherent  Volume:  Normal  Mood:  Anxious and worried, does not want to relapse   Affect:  Appropriate  Thought Process:  Coherent and Goal Directed  Orientation:  Full (Time, Place, and Person)  Thought Content:  Symptoms events worries concerns, issues of shame and guilt for his relapses  Suicidal Thoughts:  No  Homicidal Thoughts:  No  Memory:  Immediate;   Fair Recent;   Fair Remote;   Fair  Judgement:  Fair  Insight:  Present  Psychomotor Activity:  Restlessness  Concentration:  Fair  Recall:  [employer] of Knowledge:Fair  Language: Fair  Akathisia:  No  Handed:  Right  AIMS (if indicated):     Assets:  Desire for Improvement  ADL's:  Intact  Cognition: WNL  Sleep:  Number of Hours: 6   Treatment Plan Summary: Daily contact with patient to assess and evaluate symptoms and progress in treatment and Medication management Supportive approach/coping skills Polysubstance dependence; continue to work a relapse prevention  plan Work with CBT/mindfulness help process the shame and guilt and other negative emotions that could lead to a relapse Will continue the Seroquel at bedtime to help with the ruminative thinking that keeps him from falling asleep Facilitate admission to [facility] Tuesday AM Twisha Vanpelt A 02/11/2015, 7:44 PM

## 2015-02-11 NOTE — Progress Notes (Signed)
D: Pt is alert and oriented x4. Pt endorses moderate anxiety with mild depression; he states, "I need placement, we have tried a few places and nothing yet, this is making me very nervous; I also have a court date coming, was found with some drugs; I am also a little depressed that I can't see my little girl.I guess am here to be a better person." Pt also complained of mild L. knee pain of 2 on 0-10 pain scale. Pt denies however denies SI, HI and AVH. Pt continued to be cooperative and nonviolent through the assessment. A: Medications offered as prescribed.  Support, encouragement, and safe environment provided.  15-minute safety checks continue. R: Pt was med compliant.  Pt attended NA group. Safety checks continue.

## 2015-02-12 DIAGNOSIS — F1120 Opioid dependence, uncomplicated: Secondary | ICD-10-CM

## 2015-02-12 DIAGNOSIS — F331 Major depressive disorder, recurrent, moderate: Secondary | ICD-10-CM

## 2015-02-12 DIAGNOSIS — F1994 Other psychoactive substance use, unspecified with psychoactive substance-induced mood disorder: Secondary | ICD-10-CM

## 2015-02-12 NOTE — Progress Notes (Signed)
[facility]  02/12/2015 8:28 PM Victor Gomez  MRN:  [id number] Subjective:  [name] endorses that he is having a hard time. He shares about dealing with his father who is diagnosed with Schizophrenia. States his father is still under the belief that the government put a chip in his head. He is in a group home. States he loves is father but trough the years he has dealt with being embarrassed of his father inpatient and sometimes believing he uses this to manipulate other people. He states his father used drugs and he does not want to end like his father. He wants to be well for himself but mostly for his daughter.  Principal Problem: <principal problem not specified> Diagnosis:   Patient Active Problem List   Diagnosis Date Noted  . Major depressive disorder, recurrent episode (HCC) [F33.9] 02/09/2015  . Cocaine use disorder, moderate, dependence (HCC) [F14.20] 09/17/2014  . Substance induced mood disorder (HCC) [F19.94] 09/17/2014  . Polysubstance abuse [F19.10]   . Polysubstance dependence including opioid type drug, continuous use (HCC) [F11.20, F19.20] 12/06/2013  . Hepatitis C antibody test positive [R89.4] 03/13/2013  . Elevated transaminase level [R74.0] 03/13/2013   Total Time spent with patient: 30 minutes  Past Psychiatric History: see admission H and P  Past Medical History:  Past Medical History  Diagnosis Date  . Methadone use (HCC)     clinic each day  . Hepatitis C     Past Surgical History  Procedure Laterality Date  . Hernia repair     Family History: History reviewed. No pertinent family history. Family Psychiatric  History: see admission H and P Social History:  History  Alcohol Use  . Yes     History  Drug Use  . Yes  . Special: Marijuana    Comment: heroin use, last use 10/15 Meth, opiates    Social History   Social History  . Marital Status: Legally Separated    Spouse Name: N/A  . Number of Children: N/A  . Years of Education: N/A    Social History Main Topics  . Smoking status: Current Every Day Smoker -- 0.50 packs/day    Types: Cigarettes  . Smokeless tobacco: Never Used  . Alcohol Use: Yes  . Drug Use: Yes    Special: Marijuana     Comment: heroin use, last use 10/15 Meth, opiates  . Sexual Activity: Not Asked   Other Topics Concern  . None   Social History Narrative   Additional Social History:    Pain Medications: see chart Prescriptions: see chart Over the Counter: see chart History of alcohol / drug use?: Yes Longest period of sobriety (when/how long): 2 months Negative Consequences of Use: Financial, Armed forces operational officerLegal, Personal relationships, Work / Programmer, multimediachool Withdrawal Symptoms: Agitation, Sweats, Tingling, Tremors, Irritability, Weakness, Patient aware of relationship between substance abuse and physical/medical complications Name of Substance 1: Alcohol 1 - Age of First Use: 17 1 - Amount (size/oz): 2-4 40oz beers 1 - Frequency: daily 1 - Last Use / Amount: 12/25 Name of Substance 2: Cocaine 2 - Age of First Use: 20s 2 - Amount (size/oz): $10-20 2 - Frequency: 4-5 times monthly 2 - Last Use / Amount: 2 days ago                Sleep: Fair  Appetite:  Fair  Current Medications: Current Facility-Administered Medications  Medication Dose Route Frequency Provider Last Rate Last Dose  . alum & mag hydroxide-simeth (MAALOX/MYLANTA) 200-200-20 MG/5ML suspension  30 mL  30 mL Oral Q4H PRN Cleotis Nipper, MD   30 mL at 02/10/15 1643  . hydrOXYzine (ATARAX/VISTARIL) tablet 25 mg  25 mg Oral Q6H PRN Cleotis Nipper, MD   25 mg at 02/12/15 1445  . ibuprofen (ADVIL,MOTRIN) tablet 600 mg  600 mg Oral Q6H PRN Rachael Fee, MD   600 mg at 02/12/15 1351  . magnesium hydroxide (MILK OF MAGNESIA) suspension 30 mL  30 mL Oral Daily PRN Sanjuana Kava, NP      . nicotine (NICODERM CQ - dosed in mg/24 hours) patch 21 mg  21 mg Transdermal Daily Craige Cotta, MD   21 mg at 02/12/15 [phone]  . QUEtiapine (SEROQUEL)  tablet 100 mg  100 mg Oral QHS Rachael Fee, MD   100 mg at 02/11/15 2121  . traZODone (DESYREL) tablet 50 mg  50 mg Oral QHS PRN Sanjuana Kava, NP   50 mg at 02/09/15 2156    Lab Results: No results found for this or any previous visit (from the past 48 hour(s)).  Physical Findings: AIMS: Facial and Oral Movements Muscles of Facial Expression: None, normal Lips and Perioral Area: None, normal Jaw: None, normal Tongue: None, normal,Extremity Movements Upper (arms, wrists, hands, fingers): None, normal Lower (legs, knees, ankles, toes): None, normal, Trunk Movements Neck, shoulders, hips: None, normal, Overall Severity Severity of abnormal movements (highest score from questions above): None, normal Incapacitation due to abnormal movements: None, normal Patient's awareness of abnormal movements (rate only patient's report): No Awareness, Dental Status Current problems with teeth and/or dentures?: No Does patient usually wear dentures?: No  CIWA:  CIWA-Ar Total: 5 COWS:     Musculoskeletal: Strength & Muscle Tone: within normal limits Gait & Station: normal Patient leans: normal  Psychiatric Specialty Exam: Review of Systems  Constitutional: Negative.   HENT: Negative.   Eyes: Negative.   Respiratory: Negative.   Cardiovascular: Negative.   Gastrointestinal: Negative.   Genitourinary: Negative.   Musculoskeletal: Negative.   Skin: Negative.   Neurological: Negative.   Endo/Heme/Allergies: Negative.   Psychiatric/Behavioral: Positive for depression and substance abuse. The patient is nervous/anxious.     Blood pressure 121/76, pulse 82, temperature 97.5 F (36.4 C), temperature source Oral, resp. rate 18, height 5' 11.5" (1.816 m), weight 83.915 kg (185 lb).Body mass index is 25.45 kg/(m^2).  General Appearance: Fairly Groomed  Patent attorney::  Fair  Speech:  Clear and Coherent  Volume:  Normal  Mood:  Anxious, Depressed and worried  Affect:  anxious worried  Thought  Process:  Coherent and Goal Directed  Orientation:  Full (Time, Place, and Person)  Thought Content:  symtpoms events worries concerns  Suicidal Thoughts:  No  Homicidal Thoughts:  No  Memory:  Immediate;   Fair Recent;   Fair Remote;   Fair  Judgement:  Fair  Insight:  Present  Psychomotor Activity:  Restlessness  Concentration:  Fair  Recall:  [employer] of Knowledge:Fair  Language: Fair  Akathisia:  No  Handed:  Right  AIMS (if indicated):     Assets:  Desire for Improvement  ADL's:  Intact  Cognition: WNL  Sleep:  Number of Hours: 6.75   Treatment Plan Summary: Daily contact with patient to assess and evaluate symptoms and progress in treatment and Medication management Supportive approach/coping skills Polysubstance dependence: work a relapse prevention plan Anxiety/ruminative thinking; continue to work with the Seroquel 100 mg HS Depression help process the feelings associated to his  father and his illness and how it impacted him when growing up Work with CBT/mindfulness Continue to facilitate admission to [facility] A 02/12/2015, 8:28 PM

## 2015-02-12 NOTE — Progress Notes (Signed)
D: [name] is in a very pleasant mood today. He was able to speak to his dad today (who lives in a group home). He states he had not spoken to his dad in a while. He denies SI/HI/AVH. Rates Anxiety 7/10 Depression 8/10 and Hopelessness 5/10. [facility] and then to [facility] thereafter. Another goal is to quit smoking. Rates his day as "pretty good".  A: Encouragement and support given. Q 15 minute checks for patient safety.  R: Continue to monitor for patient safety and medication effectiveness.

## 2015-02-12 NOTE — Plan of Care (Signed)
Problem: Ineffective individual coping Goal: STG: Patient will remain free from self harm Outcome: Progressing Victor Gomez remains free from self harm

## 2015-02-12 NOTE — Progress Notes (Signed)
D: Pt is alert and oriented x4. Pt endorses mild anxiety; he states, "I have been scheduled for the interview with [facility] on Tuesday; I am glad it came through; am also a little nervous." Pt denies depression, SI, HI, pain and AVH. Pt continued to be cooperative and nonviolent through the assessment. A: Medications offered as prescribed.  Support, encouragement, and safe environment provided.  15-minute safety checks continue. R: Pt was med compliant.  Pt attended wrap-up group. Safety checks continue.

## 2015-02-12 NOTE — Progress Notes (Signed)
Patient ID: Victor Gomez, male   DOB: 1975/09/18, 39 y.o.   MRN: [id number]  DAR: Pt. Denies SI/HI and A/V Hallucinations. He reports sleep was good, appetite is good, energy level is low, and concentration is poor. He rates depression, anxiety, and hopelessness 8/10. Patient reports knee pain and received PRN Ibuprofen for this. Support and encouragement provided to the patient. Scheduled medications administered to patient per physician's orders. PRN Vistaril given to patient for anxiety which provided relief. Patient is receptive and cooperative. He is seen in the milieu and reports he is planning on following up with [facility]. Q15 minute checks are maintained for safety.

## 2015-02-12 NOTE — [facility] Group Notes (Signed)
[facility] LCSW Group Therapy  02/12/2015 1:07 PM  Type of Therapy:  Group Therapy  Participation Level:  Did Not Attend-pt chose to rest in room.   Modes of Intervention:  Confrontation, Discussion, Education, Exploration, Problem-solving, Rapport Building, Socialization and Support  Summary of Progress/Problems: Feelings around Relapse. Group members discussed the meaning of relapse and shared personal stories of relapse, how it affected them and others, and how they perceived themselves during this time. Group members were encouraged to identify triggers, warning signs and coping skills used when facing the possibility of relapse. Social supports were discussed and explored in detail. Post Acute Withdrawal Syndrome (handout provided) was introduced and examined. Pt's were encouraged to ask questions, talk about key points associated with PAWS, and process this information in terms of relapse prevention.   Smart, Lenora Gomes LCSW 02/12/2015, 1:07 PM

## 2015-02-12 NOTE — [facility] Group Notes (Signed)
Adult Psychoeducational Group Note  Date:  02/12/2015 Time:  8:00pm  Group Topic/Focus:  Wrap-Up Group:   The focus of this group is to help patients review their daily goal of treatment and discuss progress on daily workbooks.  Participation Level:  Active  Participation Quality:  Appropriate and Attentive  Affect:  Appropriate  Cognitive:  Alert and Appropriate  Insight: Appropriate  Engagement in Group:  Engaged  Modes of Intervention:  Discussion  Additional Comments: Pt was attentive and appropriate. Pt was able to attend na/aa group.   Bing PlumeScott, Hymie Gorr D 02/12/2015, 9:19 PM

## 2015-02-13 DIAGNOSIS — F331 Major depressive disorder, recurrent, moderate: Secondary | ICD-10-CM

## 2015-02-13 NOTE — [facility] Group Notes (Signed)
[facility] Group Notes:  (Nursing/MHT/Case Management/Adjunct)  Date:  02/13/2015  Time:  2:26 PM  Type of Therapy:  Psychoeducational Skills  Participation Level:  Active  Participation Quality:  Appropriate  Affect:  Appropriate  Cognitive:  Appropriate  Insight:  Appropriate  Engagement in Group:  Engaged  Modes of Intervention:  Discussion  Summary of Progress/Problems: Pt did attend self inventory group.   Victor Gomez Shanta 02/13/2015, 2:26 PM 

## 2015-02-13 NOTE — [facility] Group Notes (Signed)
Adult Psychoeducational Group Note  Date:  02/13/2015 Time:  11:52 PM  Group Topic/Focus:  Wrap-Up Group:   The focus of this group is to help patients review their daily goal of treatment and discuss progress on daily workbooks.  Participation Level:  Active  Participation Quality:  Appropriate  Affect:  Appropriate  Cognitive:  Appropriate  Insight: Appropriate  Engagement in Group:  Engaged  Modes of Intervention:  Discussion  Additional Comments:  Pt attended and participated during wrap-up group.  Jillene Wehrenberg T Mazelle Huebert 02/13/2015, 11:52 PM

## 2015-02-13 NOTE — [facility] Group Notes (Signed)
[facility] Group Notes:  (Clinical Social Work)   02/13/2015 10:00-11:00AM  Summary of Progress/Problems: In today's process group a decisional balance exercise was used to explore in depth the perceived benefits and costs of unhealthy coping techniques, as well as the benefits and costs of replacing these with healthy coping skills. Among the unhealthy coping techniques listed by the group were drinking, isolating, overtaking pain medications, directing anger inward, trying to control everything, stopping medications, denying grief, rumination, and anger outbursts. Motivational Interviewing and the whiteboard were utilized for the exercises. The patient stated he wants and needs to learn how to not resort to drinking alcohol as a coping mechanism.  He listened attentively throughout group, was alert and seemed to be interested, but did not contribute verbally.  Type of Therapy:  Group Therapy - Process   Participation Level:  Active  Participation Quality:  Attentive  Affect:  Blunted  Cognitive:  Alert and Appropriate  Insight:  Developing/Improving  Engagement in Therapy:  Engaged  Modes of Intervention:  Education, Motivational Interviewing  Ambrose MantleMareida Grossman-Orr, LCSW 02/13/2015, 12:33 PM

## 2015-02-13 NOTE — Progress Notes (Signed)
[facility] MD Progress Note  02/13/2015 6:06 PM Victor Gomez  MRN:  [id number] Subjective:  Continues to express regrets guilt shame for his past behaviors "all the lost time" states he wants to focus on the now and what he can do to get his life back together. States that he does not want to let his daughter anymore Principal Problem: <principal problem not specified> Diagnosis:   Patient Active Problem List   Diagnosis Date Noted  . Major depressive disorder, recurrent episode (HCC) [F33.9] 02/09/2015  . Cocaine use disorder, moderate, dependence (HCC) [F14.20] 09/17/2014  . Substance induced mood disorder (HCC) [F19.94] 09/17/2014  . Polysubstance abuse [F19.10]   . Polysubstance dependence including opioid type drug, continuous use (HCC) [F11.20, F19.20] 12/06/2013  . Hepatitis C antibody test positive [R89.4] 03/13/2013  . Elevated transaminase level [R74.0] 03/13/2013   Total Time spent with patient: 20 minutes  Past Psychiatric History: see admission H and P  Past Medical History:  Past Medical History  Diagnosis Date  . Methadone use (HCC)     clinic each day  . Hepatitis C     Past Surgical History  Procedure Laterality Date  . Hernia repair     Family History: History reviewed. No pertinent family history. Family Psychiatric  History: see Admission H and P Social History:   History  Alcohol Use  . Yes     History  Drug Use  . Yes  . Special: Marijuana    Comment: heroin use, last use 10/15 Meth, opiates    Social History   Social History  . Marital Status: Legally Separated    Spouse Name: N/A  . Number of Children: N/A  . Years of Education: N/A   Social History Main Topics  . Smoking status: Current Every Day Smoker -- 0.50 packs/day    Types: Cigarettes  . Smokeless tobacco: Never Used  . Alcohol Use: Yes  . Drug Use: Yes    Special: Marijuana     Comment: heroin use, last use 10/15 Meth, opiates  . Sexual Activity: Not Asked   Other Topics  Concern  . None   Social History Narrative   Additional Social History:    Pain Medications: see chart Prescriptions: see chart Over the Counter: see chart History of alcohol / drug use?: Yes Longest period of sobriety (when/how long): 2 months Negative Consequences of Use: Financial, Armed forces operational officer, Personal relationships, Work / Programmer, multimedia Withdrawal Symptoms: Agitation, Sweats, Tingling, Tremors, Irritability, Weakness, Patient aware of relationship between substance abuse and physical/medical complications Name of Substance 1: Alcohol 1 - Age of First Use: 17 1 - Amount (size/oz): 2-4 40oz beers 1 - Frequency: daily 1 - Last Use / Amount: 12/25 Name of Substance 2: Cocaine 2 - Age of First Use: 20s 2 - Amount (size/oz): $10-20 2 - Frequency: 4-5 times monthly 2 - Last Use / Amount: 2 days ago                Sleep: Fair  Appetite:  Fair  Current Medications: Current Facility-Administered Medications  Medication Dose Route Frequency Provider Last Rate Last Dose  . alum & mag hydroxide-simeth (MAALOX/MYLANTA) 200-200-20 MG/5ML suspension 30 mL  30 mL Oral Q4H PRN Cleotis Nipper, MD   30 mL at 02/10/15 1643  . hydrOXYzine (ATARAX/VISTARIL) tablet 25 mg  25 mg Oral Q6H PRN Cleotis Nipper, MD   25 mg at 02/13/15 0641  . ibuprofen (ADVIL,MOTRIN) tablet 600 mg  600 mg Oral Q6H PRN Madie Reno  A Huntley Demedeiros, MD   600 mg at 02/12/15 2245  . magnesium hydroxide (MILK OF MAGNESIA) suspension 30 mL  30 mL Oral Daily PRN Sanjuana KavaAgnes I Nwoko, NP      . nicotine (NICODERM CQ - dosed in mg/24 hours) patch 21 mg  21 mg Transdermal Daily Craige CottaFernando A Cobos, MD   21 mg at 02/13/15 1114  . QUEtiapine (SEROQUEL) tablet 100 mg  100 mg Oral QHS Rachael FeeIrving A Mikkel Charrette, MD   100 mg at 02/12/15 2244  . traZODone (DESYREL) tablet 50 mg  50 mg Oral QHS PRN Sanjuana KavaAgnes I Nwoko, NP   50 mg at 02/12/15 2244    Lab Results: No results found for this or any previous visit (from the past 48 hour(s)).  Physical Findings: AIMS: Facial and Oral  Movements Muscles of Facial Expression: None, normal Lips and Perioral Area: None, normal Jaw: None, normal Tongue: None, normal,Extremity Movements Upper (arms, wrists, hands, fingers): None, normal Lower (legs, knees, ankles, toes): None, normal, Trunk Movements Neck, shoulders, hips: None, normal, Overall Severity Severity of abnormal movements (highest score from questions above): None, normal Incapacitation due to abnormal movements: None, normal Patient's awareness of abnormal movements (rate only patient's report): No Awareness, Dental Status Current problems with teeth and/or dentures?: No Does patient usually wear dentures?: No  CIWA:  CIWA-Ar Total: 5 COWS:     Musculoskeletal: Strength & Muscle Tone: within normal limits Gait & Station: normal Patient leans: normal  Psychiatric Specialty Exam: Review of Systems  Constitutional: Negative.   HENT: Negative.   Eyes: Negative.   Respiratory: Negative.   Cardiovascular: Negative.   Gastrointestinal: Negative.   Genitourinary: Negative.   Musculoskeletal: Negative.   Skin: Negative.   Neurological: Negative.   Endo/Heme/Allergies: Negative.   Psychiatric/Behavioral: Positive for depression and substance abuse. The patient is nervous/anxious.     Blood pressure 118/84, pulse 85, temperature 97.5 F (36.4 C), temperature source Oral, resp. rate 18, height 5' 11.5" (1.816 m), weight 83.915 kg (185 lb).Body mass index is 25.45 kg/(m^2).  General Appearance: Fairly Groomed  Patent attorneyye Contact::  Fair  Speech:  Clear and Coherent  Volume:  Normal  Mood:  Anxious and sad  Affect:  anxious worried  Thought Process:  Coherent and Goal Directed  Orientation:  Full (Time, Place, and Person)  Thought Content:  symptoms events worries concerns  Suicidal Thoughts:  No  Homicidal Thoughts:  No  Memory:  Immediate;   Fair Recent;   Fair Remote;   Fair  Judgement:  Fair  Insight:  Present  Psychomotor Activity:  Restlessness   Concentration:  Fair  Recall:  [employer]  Fund of Knowledge:Fair  Language: Fair  Akathisia:  No  Handed:  Right  AIMS (if indicated):     Assets:  Desire for Improvement  ADL's:  Intact  Cognition: WNL  Sleep:  Number of Hours: 5.25   Treatment Plan Summary: Daily contact with patient to assess and evaluate symptoms and progress in treatment and Medication management Supportive approach/coping skills Polysubstance Dependence; continue to work on a relapse prevention plan Mood disorder; continue the Seroquel 100 mg HS Continue to encourage to [facility] Skin lesion; NP to assess Use CBT/mindfulness Nayshawn Mesta A 02/13/2015, 6:06 PM

## 2015-02-13 NOTE — Progress Notes (Signed)
Patient ID: Victor SprayJason L Gomez, male   DOB: Feb 05, 1976, 39 y.o.   MRN: [id number]   D: Pt has been very flat and depressed on the unit today. Pt was also very isolative this morning, but did warm up as the day went on. Pt reported that his depressed was a 5, his hopelessness was a 7, and his anxiety was a 8. Pt reported that his goal for today was to don't do drugs and alcohol. Pt reported being negative SI/HI, no AH/VH noted. A: 15 min checks continued for patient safety. R: Pt safety maintained.

## 2015-02-14 NOTE — [facility] Group Notes (Signed)
[facility] Group Notes: (Clinical Social Work)   02/14/2015      Type of Therapy:  Group Therapy   Participation Level:  Did Not Attend despite MHT prompting   Ambrose MantleMareida Grossman-Orr, LCSW 02/14/2015, 12:41 PM

## 2015-02-14 NOTE — Progress Notes (Signed)
[facility] MD Progress Note  02/14/2015 9:52 PM Victor Gomez  MRN:  [id number] Subjective:  [name] continues to endorse that he still has cravings for the opioids, even after all that he has been trough. Recognizes the need to go to rehab. He states that he is sleeping much better and that the Seroquel is helping the depression overall. States he has had so many negative responses from antidepressants that he would just want to stay on the Seroquel at night. States he has heard from his daughter and that has made his day Principal Problem: <principal problem not specified> Diagnosis:   Patient Active Problem List   Diagnosis Date Noted  . Moderate episode of recurrent major depressive disorder (HCC) [F33.1]   . Major depressive disorder, recurrent episode (HCC) [F33.9] 02/09/2015  . Cocaine use disorder, moderate, dependence (HCC) [F14.20] 09/17/2014  . Substance induced mood disorder (HCC) [F19.94] 09/17/2014  . Polysubstance abuse [F19.10]   . Polysubstance dependence including opioid type drug, continuous use (HCC) [F11.20, F19.20] 12/06/2013  . Hepatitis C antibody test positive [R89.4] 03/13/2013  . Elevated transaminase level [R74.0] 03/13/2013   Total Time spent with patient: 20 minutes  Past Psychiatric History: see admission H and P  Past Medical History:  Past Medical History  Diagnosis Date  . Methadone use (HCC)     clinic each day  . Hepatitis C     Past Surgical History  Procedure Laterality Date  . Hernia repair     Family History: History reviewed. No pertinent family history. Family Psychiatric  History: see admission H and P Social History:  History  Alcohol Use  . Yes     History  Drug Use  . Yes  . Special: Marijuana    Comment: heroin use, last use 10/15 Meth, opiates    Social History   Social History  . Marital Status: Legally Separated    Spouse Name: N/A  . Number of Children: N/A  . Years of Education: N/A   Social History Main Topics  .  Smoking status: Current Every Day Smoker -- 0.50 packs/day    Types: Cigarettes  . Smokeless tobacco: Never Used  . Alcohol Use: Yes  . Drug Use: Yes    Special: Marijuana     Comment: heroin use, last use 10/15 Meth, opiates  . Sexual Activity: Not Asked   Other Topics Concern  . None   Social History Narrative   Additional Social History:    Pain Medications: see chart Prescriptions: see chart Over the Counter: see chart History of alcohol / drug use?: Yes Longest period of sobriety (when/how long): 2 months Negative Consequences of Use: Financial, Armed forces operational officer, Personal relationships, Work / Programmer, multimedia Withdrawal Symptoms: Agitation, Sweats, Tingling, Tremors, Irritability, Weakness, Patient aware of relationship between substance abuse and physical/medical complications Name of Substance 1: Alcohol 1 - Age of First Use: 17 1 - Amount (size/oz): 2-4 40oz beers 1 - Frequency: daily 1 - Last Use / Amount: 12/25 Name of Substance 2: Cocaine 2 - Age of First Use: 20s 2 - Amount (size/oz): $10-20 2 - Frequency: 4-5 times monthly 2 - Last Use / Amount: 2 days ago                Sleep: Fair  Appetite:  Fair  Current Medications: Current Facility-Administered Medications  Medication Dose Route Frequency Provider Last Rate Last Dose  . alum & mag hydroxide-simeth (MAALOX/MYLANTA) 200-200-20 MG/5ML suspension 30 mL  30 mL Oral Q4H PRN Cleotis Nipper,  MD   30 mL at 02/10/15 1643  . hydrOXYzine (ATARAX/VISTARIL) tablet 25 mg  25 mg Oral Q6H PRN Cleotis NipperSyed T Arfeen, MD   25 mg at 02/14/15 2000  . ibuprofen (ADVIL,MOTRIN) tablet 600 mg  600 mg Oral Q6H PRN Rachael FeeIrving A Da Authement, MD   600 mg at 02/12/15 2245  . magnesium hydroxide (MILK OF MAGNESIA) suspension 30 mL  30 mL Oral Daily PRN Sanjuana KavaAgnes I Nwoko, NP      . nicotine (NICODERM CQ - dosed in mg/24 hours) patch 21 mg  21 mg Transdermal Daily Craige CottaFernando A Cobos, MD   21 mg at 02/14/15 1142  . QUEtiapine (SEROQUEL) tablet 100 mg  100 mg Oral QHS Rachael FeeIrving  A Nyeem Stoke, MD   100 mg at 02/13/15 2250  . traZODone (DESYREL) tablet 50 mg  50 mg Oral QHS PRN Sanjuana KavaAgnes I Nwoko, NP   50 mg at 02/13/15 2250    Lab Results: No results found for this or any previous visit (from the past 48 hour(s)).  Physical Findings: AIMS: Facial and Oral Movements Muscles of Facial Expression: None, normal Lips and Perioral Area: None, normal Jaw: None, normal Tongue: None, normal,Extremity Movements Upper (arms, wrists, hands, fingers): None, normal Lower (legs, knees, ankles, toes): None, normal, Trunk Movements Neck, shoulders, hips: None, normal, Overall Severity Severity of abnormal movements (highest score from questions above): None, normal Incapacitation due to abnormal movements: None, normal Patient's awareness of abnormal movements (rate only patient's report): No Awareness, Dental Status Current problems with teeth and/or dentures?: No Does patient usually wear dentures?: No  CIWA:  CIWA-Ar Total: 5 COWS:     Musculoskeletal: Strength & Muscle Tone: within normal limits Gait & Station: normal Patient leans: normal  Psychiatric Specialty Exam: Review of Systems  Constitutional: Negative.   HENT: Negative.   Eyes: Negative.   Respiratory: Negative.   Cardiovascular: Negative.   Gastrointestinal: Negative.   Genitourinary: Negative.   Musculoskeletal: Negative.   Skin: Negative.   Neurological: Negative.   Endo/Heme/Allergies: Negative.   Psychiatric/Behavioral: Positive for depression and substance abuse.    Blood pressure 105/66, pulse 103, temperature 97.9 F (36.6 C), temperature source Oral, resp. rate 16, height 5' 11.5" (1.816 m), weight 83.915 kg (185 lb).Body mass index is 25.45 kg/(m^2).  General Appearance: Fairly Groomed  Patent attorneyye Contact::  Fair  Speech:  Clear and Coherent  Volume:  Normal  Mood:  Anxious  Affect:  anxious worried  Thought Process:  Coherent and Goal Directed  Orientation:  Full (Time, Place, and Person)  Thought  Content:  symptoms events worries concerns  Suicidal Thoughts:  No  Homicidal Thoughts:  No  Memory:  Immediate;   Fair Recent;   Fair Remote;   Fair  Judgement:  Fair  Insight:  Present  Psychomotor Activity:  Normal  Concentration:  Fair  Recall:  [employer]Fair  Fund of Knowledge:Fair  Language: Fair  Akathisia:  No  Handed:  Right  AIMS (if indicated):     Assets:  Desire for Improvement  ADL's:  Intact  Cognition: WNL  Sleep:  Number of Hours: 5.75   Treatment Plan Summary: Daily contact with patient to assess and evaluate symptoms and progress in treatment and Medication management Supportive approach/coping skills Polysubstance dependence; continue to work a relapse prevention plan Depression; continue to work with the Seroquel to help the ruminative thinking that kept him awake at night  Work with CBT/mindfulness [facility]ilitate [facility] Dakin Madani A 02/14/2015, 9:52 PM

## 2015-02-14 NOTE — Progress Notes (Signed)
Patient ID: Victor SprayJason L Gomez, male   DOB: 07/26/1975, 40 y.o.   MRN: [id number]   D: Pt has been very flat and depressed on the unit today. Pt reported that his depressed was a 7, his hopelessness was a 7, and his anxiety was a 7. Pt reported that he had no goal for today. Pt reported being negative SI/HI, no AH/VH noted. A: 15 min checks continued for patient safety. R: Pt safety maintained.

## 2015-02-14 NOTE — Progress Notes (Signed)
  D: Patient pleasant and cooperative with care this shift. Pt noted to be out in the milieu interacting well with peers. Pt brightens on approach. Pt states he is concerned about a sore on his right temple and states he is glad that Dr. Dub MikesLugo examined it, but is concerned it may be cancerous.  A: Q 15 minute safety checks, encourage peer interaction, redirect behaviors as needed, administer medications as ordered by MD. R: Pt denies SI/HI or plans to harm himself. Pt compliant with HS medications.

## 2015-02-15 DIAGNOSIS — F332 Major depressive disorder, recurrent severe without psychotic features: Principal | ICD-10-CM

## 2015-02-15 MED ORDER — TRAZODONE HCL 50 MG PO TABS: 50.0000 mg | ORAL_TABLET | Freq: Every evening | ORAL | Status: DC | PRN

## 2015-02-15 MED ORDER — QUETIAPINE FUMARATE 100 MG PO TABS: 100.0000 mg | ORAL_TABLET | Freq: Every day | ORAL | Status: DC

## 2015-02-15 MED ORDER — NICOTINE 21 MG/24HR TD PT24: 21.0000 mg | MEDICATED_PATCH | Freq: Every day | TRANSDERMAL | Status: DC

## 2015-02-15 MED ORDER — HYDROXYZINE HCL 25 MG PO TABS: 25.0000 mg | ORAL_TABLET | Freq: Four times a day (QID) | ORAL | Status: DC | PRN

## 2015-02-15 NOTE — Progress Notes (Signed)
  D: Patient pleasant and brightens on approach. Pt interacting well with peers in the milieu. Pt with complaints of anxiety, but states relief with prn medications.  A: Q 15 minute safety checks, encourage staff/peer interaction, group participation, administer medications as ordered. R: Patient compliant with HS medications and participated in group session this shift. Pt denies SI/HI or plans to harm himself. No s/s of distress noted.

## 2015-02-15 NOTE — Progress Notes (Signed)
Patient discharged per physician order. Patient denies SI/HI and A/V hallucinations. Patient received samples, prescriptions, and copy of AVS after it was reviewed. Patient has no complaints of pain, patient left the unit ambulatory

## 2015-02-15 NOTE — [facility] Group Notes (Signed)
[facility] LCSW Group Therapy 02/15/2015  1:15 pm  Type of Therapy: Group Therapy Participation Level: Active  Participation Quality: Attentive, Sharing and Supportive  Affect: Appropriate  Cognitive: Alert and Oriented  Insight: Developing/Improving and Engaged  Engagement in Therapy: Developing/Improving and Engaged  Modes of Intervention: Clarification, Confrontation, Discussion, Education, Exploration,  Limit-setting, Orientation, Problem-solving, Rapport Building, Dance movement psychotherapisteality Testing, Socialization and Support  Summary of Progress/Problems: Pt identified obstacles faced currently and processed barriers involved in overcoming these obstacles. Pt identified steps necessary for overcoming these obstacles and explored motivation (internal and external) for facing these difficulties head on. Pt further identified one area of concern in their lives and chose a goal to focus on for today. Patient identified financial stressors, lack of stable housing, and relationship issues with mother as obstacles. He identifies his relationship with his 38 year old daughter as his motivation for recovery and discussed how he has been clean from drugs for several months.  Samuella BruinKristin Azula Zappia, MSW, [employer]CSWA Clinical Social Worker [facility] [phone]
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
## 2015-02-15 NOTE — Progress Notes (Signed)
Recreation Therapy Notes  Date: 01.02.2017 Time: 9:30am Location: 300 Group Room   Group Topic: Stress Management  Goal Area(s) Addresses:  Patient will actively participate in stress management techniques presented during session.   Behavioral Response: Did not attend.   Marykay Lexenise L Makaylee Spielberg, LRT/CTRS        Sagar Tengan L 02/15/2015 10:17 AM

## 2015-02-15 NOTE — Progress Notes (Signed)
Patient ID: Victor SprayJason L Lohr, male   DOB: 1975-09-09, 40 y.o.   MRN: [id number] D-Completed self inventory and rated self a 5 on feelings of depression today and 7's for how he is feeling re his hopelessness and anxiety. He is able to contract for safety. He is in the milieu and attending groups.  A-Support offered monitored for safety and medications as ordered.  R-No complaints voiced at this time.

## 2015-02-15 NOTE — Progress Notes (Signed)
Patient ID: Victor Gomez, male   DOB: 1975-12-08, 40 y.o.   MRN: [id number] Requested something for anxiety. Gave him Vistaril as ordered. Had it last at 0748 this am. States he hasnt been able to contact his mom who he was counting on for [facility]. He is asking if we can provide transport there. Left message with his social worker re his concern.

## 2015-02-15 NOTE — BHH Suicide Risk Assessment (Signed)
[facility]   Demographic Factors:  Male and Caucasian  Total Time spent with patient: 30 minutes  Musculoskeletal: Strength & Muscle Tone: within normal limits Gait & Station: normal Patient leans: normal  Psychiatric Specialty Exam: Physical Exam  Review of Systems  Constitutional: Negative.   HENT: Negative.   Eyes: Negative.   Respiratory: Negative.   Cardiovascular: Negative.   Gastrointestinal: Negative.   Genitourinary: Negative.   Musculoskeletal: Negative.   Skin: Negative.   Neurological: Negative.   Endo/Heme/Allergies: Negative.   Psychiatric/Behavioral: Positive for substance abuse.    Blood pressure 127/69, pulse 91, temperature 97.5 F (36.4 C), temperature source Oral, resp. rate 16, height 5' 11.5" (1.816 m), weight 83.915 kg (185 lb).Body mass index is 25.45 kg/(m^2).  General Appearance: Fairly Groomed  Patent attorneyye Contact::  Fair  Speech:  Clear and Coherent409  Volume:  Normal  Mood:  Euthymic  Affect:  Appropriate  Thought Process:  Coherent and Goal Directed  Orientation:  Full (Time, Place, and Person)  Thought Content:  plans as he moves on, relapse prevention plan  Suicidal Thoughts:  No  Homicidal Thoughts:  No  Memory:  Immediate;   Fair Recent;   Fair Remote;   Fair  Judgement:  Fair  Insight:  Present  Psychomotor Activity:  Normal  Concentration:  Fair  Recall:  FiservFair  Fund of Knowledge:Fair  Language: Fair  Akathisia:  No  Handed:  Right  AIMS (if indicated):     Assets:  Desire for Improvement  Sleep:  Number of Hours: 5.75  Cognition: WNL  ADL's:  Intact   Have you used any form of tobacco in the last 30 days? (Cigarettes, Smokeless Tobacco, Cigars, and/or Pipes): Yes  Has this patient used any form of tobacco in the last 30 days? (Cigarettes, Smokeless Tobacco, Cigars, and/or Pipes) Yes, A prescription for an FDA-approved tobacco cessation medication was offered at discharge and the patient  refused  Mental Status Per Nursing Assessment::   On Admission:     Current Mental Status by Physician:In full contact with reality. There are no active S/S of withdrawal. There are no active SI plans or intent. He is willing and motivated to pursue residential treatment. States he is committed to long term abstinence   Loss Factors: Financial problems/change in socioeconomic status  Historical Factors: Family history of mental illness or substance abuse  Risk Reduction Factors:   Sense of responsibility to family  Continued Clinical Symptoms:  Depression:   Comorbid alcohol abuse/dependence Insomnia Alcohol/Substance Abuse/Dependencies  Cognitive Features That Contribute To Risk:  Closed-mindedness, Polarized thinking and Thought constriction (tunnel vision)    Suicide Risk:  Minimal: No identifiable suicidal ideation.  Patients presenting with no risk factors but with morbid ruminations; may be classified as minimal risk based on the severity of the depressive symptoms  Principal Problem: MDD (major depressive disorder), recurrent severe, without psychosis [facility](HCC) Discharge Diagnoses:  Patient Active Problem List   Diagnosis Date Noted  . MDD (major depressive disorder), recurrent severe, without psychosis (HCC) [F33.2] 02/15/2015  . Cocaine use disorder, moderate, dependence (HCC) [F14.20] 09/17/2014  . Substance induced mood disorder (HCC) [F19.94] 09/17/2014  . Polysubstance abuse [F19.10]   . Polysubstance dependence including opioid type drug, continuous use (HCC) [F11.20, F19.20] 12/06/2013  . Hepatitis C antibody test positive [R89.4] 03/13/2013  . Elevated transaminase level [R74.0] 03/13/2013    Follow-up Information    [facility].   Why:  Screening for possible  admission on this date at 8:00AM. Please bring proof of [place] address/medicare card with you to this appt.    Contact information:   [address] [place], [place] [address] [phone]      Plan Of Care/Follow-up recommendations:  Activity:  as tolerated Diet:  regular Follow up [facility] as above Is patient on multiple antipsychotic therapies at discharge:  No   Has Patient had three or more failed trials of antipsychotic monotherapy by history:  No  Recommended Plan for Multiple Antipsychotic Therapies: NA    Emya Picado A 02/15/2015, 5:18 PM

## 2015-02-15 NOTE — Tx Team (Addendum)
Interdisciplinary Treatment Plan Update (Adult)  Date:  02/15/2015  Time Reviewed:  9:52 AM   Progress in Treatment: Attending groups: Yes Participating in groups:  Yes Taking medication as prescribed:  Yes. Tolerating medication:  Yes. Family/Significant othe contact made:  Yes, CSW spoke with mother Patient understands diagnosis:  Yes. and As evidenced by:  seeking treatment for Si, depression, med stabilization, crack cocaine abuse, alcohol abuse.  Discussing patient identified problems/goals with staff:  Yes. Medical problems stabilized or resolved:  Yes. Denies suicidal/homicidal ideation: Yes, denies SI Issues/concerns per patient self-inventory:  Other:  Discharge Plan or Barriers:  Patient plans to go to [facility] for continued treatment.  Reason for Continuation of Hospitalization: Depression Medication stabilization Suicidal ideation Withdrawal symptoms  Comments:  Victor Gomez is an 40 y.o. male. Patient was brought into the ED by mother because of suicidal ideations and drug use. Patient continues to endorse suicidal ideations with a plan to jump in traffic. On admission patient reported to medication he was contemplating hanging himself. "I can't stand it no longer". Patient currently denies HI, A.VH, and other self injurious behaviors. Patient reports the holiday has increased his symptoms of depression because of his inability to visit with his daughter and of other relationship conflicts with family members. Patient reports his substance use includes alcohol, daily, since age 29, 2-4 40oz beers, and last used 12/26. Patient reports using cocaine(crack), since his 20's, $10-20, and last used 2 days ago. Patient has a history of heroin use but last used in August of 2016. Patient reports previous treatment history at [facility] five months ago and [facility] for methadone treatment in 2014. Patient reports having an upcoming traffic ticket court date on 02/25/2015.  Diagnosis: Substance Induced Mood Disorder; Alcohol use, moderate; Cocaine use, moderate  Estimated length of stay:  Discharge anticipated for 02/15/15  New goal(s): to develop effective aftercare plan.   Additional Comments:  Patient and CSW reviewed pt's identified goals and treatment plan. Patient verbalized understanding and agreed to treatment plan. CSW reviewed [facility] "Discharge Process and Patient Involvement" Form. Pt verbalized understanding of information provided and signed form.    Review of initial/current patient goals per problem list:  1. Goal(s): Patient will participate in aftercare plan  Met: Yes  Target date: at discharge  As evidenced by: Patient will participate within aftercare plan AEB aftercare provider and housing plan at discharge being identified.  12/27: CSW assessing for appropriate referrals.  1/2: Goal met. Patient plans to go to [facility] for continued treatment  2. Goal (s): Patient will exhibit decreased depressive symptoms and suicidal ideations.  Met: Yes   Target date: at discharge  As evidenced by: Patient will utilize self rating of depression at 3 or below and demonstrate decreased signs of depression or be deemed stable for discharge by MD.  12/27: Pt rates depression as high. Denies Si/HI/AVh.  1/2: Goal met. Patient rates depression at 0 today, denies SI.   3. Goal(s): Patient will demonstrate decreased signs of withdrawal due to substance abuse  Met:Yes  Target date:at discharge   As evidenced by: Patient will produce a CIWA/COWS score of 0, have stable vitals signs, and no symptoms of withdrawal.  12/27: Pt reports CIWA score of 5 and stable vitals.  1/2: Goal met. No withdrawal symptoms reported at this time per medical chart.   Attendees: Patient:    Family:    Physician: Dr. Shea Evans, Dr. Sabra Heck  02/15/2015 9:30 AM  Nursing: Jerene Bears, [place]  Idamae Schuller, RN 02/15/2015 9:30 AM  Clinical Social Worker: Erasmo Downer  Katonya Blecher,  LCSWA 02/15/2015 9:30 AM  Other: Peri Maris, LCSW; [place], LCSW  02/15/2015 9:30 AM   02/15/2015 9:30 AM  Other: Lars Pinks, Case Manager 02/15/2015 9:30 AM  Other:  Agustina Caroli, NP 02/15/2015 9:30 AM  Other:      Scribe for Treatment Team:   Tilden Fossa, MSW, [place] Worker [facility] [phone]

## 2015-02-15 NOTE — Progress Notes (Signed)
Clarified with Dr. Dub MikesLugo and patient will be discharged on 02/15/15.

## 2015-02-16 ENCOUNTER — Encounter (HOSPITAL_BASED_OUTPATIENT_CLINIC_OR_DEPARTMENT_OTHER): Payer: Self-pay | Admitting: *Deleted

## 2015-02-16 ENCOUNTER — Emergency Department (HOSPITAL_BASED_OUTPATIENT_CLINIC_OR_DEPARTMENT_OTHER)
Admission: EM | Admit: 2015-02-16 | Discharge: 2015-02-17 | Disposition: A | Discharge status: Home / Self Care | Payer: Medicaid Other | Attending: Emergency Medicine | Admitting: Emergency Medicine

## 2015-02-16 DIAGNOSIS — F17210 Nicotine dependence, cigarettes, uncomplicated: Secondary | ICD-10-CM | POA: Insufficient documentation

## 2015-02-16 DIAGNOSIS — Z79899 Other long term (current) drug therapy: Secondary | ICD-10-CM | POA: Insufficient documentation

## 2015-02-16 DIAGNOSIS — Z8619 Personal history of other infectious and parasitic diseases: Secondary | ICD-10-CM | POA: Insufficient documentation

## 2015-02-16 DIAGNOSIS — L989 Disorder of the skin and subcutaneous tissue, unspecified: Secondary | ICD-10-CM | POA: Insufficient documentation

## 2015-02-16 NOTE — BHH Suicide Risk Assessment (Signed)
BHH INPATIENT:  Family/Significant Other Suicide Prevention Education  Suicide Prevention Education:  Education Completed; mother Arleta CreekBeverly [place] [phone],  (name of family member/significant other) has been identified by the patient as the family member/significant other with whom the patient will be residing, and identified as the person(s) who will aid the patient in the event of a mental health crisis (suicidal ideations/suicide attempt).  With written consent from the patient, the family member/significant other has been provided the following suicide prevention education, prior to the and/or following the discharge of the patient.  The suicide prevention education provided includes the following:  Suicide risk factors  Suicide prevention and interventions  National Suicide Hotline telephone number  [facility] assessment telephone number  [facility] Emergency Assistance 911  [facility] and/or Residential Mobile Crisis Unit telephone number  Request made of family/significant other to:  Remove weapons (e.g., guns, rifles, knives), all items previously/currently identified as safety concern.    Remove drugs/medications (over-the-counter, prescriptions, illicit drugs), all items previously/currently identified as a safety concern.  The family member/significant other verbalizes understanding of the suicide prevention education information provided.  The family member/significant other agrees to remove the items of safety concern listed above.  [place], [place] CarboKristin L 02/16/2015, 8:08 AM

## 2015-02-16 NOTE — ED Notes (Signed)
Rash in his head x 3 years. [facility] and thought he might as well take advantage of the care they offer.

## 2015-02-16 NOTE — Progress Notes (Signed)
  [facility] Discharge Plan :  Will you be returning to the same living situation after discharge:  No. [facility] At discharge, do you have transportation home?: Yes,  friend will pick up Do you have the ability to pay for your medications: Yes,  patient provided with prescriptions at discharge  Release of information consent forms completed and in the chart;  Patient's signature needed at discharge.  Patient to Follow up at: Follow-up Information    [facility] On 02/16/2015.   Why:  Screening for possible admission on this date at 8:00AM. Please bring proof of [place] address/medicare card with you to this appt.    Contact information:   [address],  [place], [place] [address] [phone]      Next level of care provider has access to [facility] Link:no  Safety Planning and Suicide Prevention discussed: Yes,  with patient and mother  Have you used any form of tobacco in the last 30 days? (Cigarettes, Smokeless Tobacco, Cigars, and/or Pipes): Yes  Has patient been referred to the Quitline?: Patient refused referral  Patient has been referred for addiction treatment: Yes  Victor Gomez, [place] CarboKristin L 02/16/2015, 8:09 AM

## 2015-02-17 ENCOUNTER — Encounter: Payer: Self-pay | Admitting: Family Medicine

## 2015-02-17 ENCOUNTER — Ambulatory Visit: Payer: Self-pay | Attending: Family Medicine | Admitting: Family Medicine

## 2015-02-17 VITALS — BP 125/78 | HR 74 | Temp 97.8°F | Resp 13 | Ht 72.0 in | Wt 210.2 lb

## 2015-02-17 DIAGNOSIS — F17210 Nicotine dependence, cigarettes, uncomplicated: Secondary | ICD-10-CM | POA: Insufficient documentation

## 2015-02-17 DIAGNOSIS — F1910 Other psychoactive substance abuse, uncomplicated: Secondary | ICD-10-CM | POA: Insufficient documentation

## 2015-02-17 DIAGNOSIS — F332 Major depressive disorder, recurrent severe without psychotic features: Secondary | ICD-10-CM | POA: Insufficient documentation

## 2015-02-17 DIAGNOSIS — R21 Rash and other nonspecific skin eruption: Secondary | ICD-10-CM | POA: Insufficient documentation

## 2015-02-17 DIAGNOSIS — R45851 Suicidal ideations: Secondary | ICD-10-CM | POA: Insufficient documentation

## 2015-02-17 DIAGNOSIS — B1920 Unspecified viral hepatitis C without hepatic coma: Secondary | ICD-10-CM | POA: Insufficient documentation

## 2015-02-17 MED ORDER — NICOTINE 21 MG/24HR TD PT24: 21.0000 mg | MEDICATED_PATCH | Freq: Every day | TRANSDERMAL | Status: DC

## 2015-02-17 NOTE — Progress Notes (Signed)
Patient here to follow up on his trip to ED for right sided facial lesion He has had for 3 years-he has tried neosporin with no luck He is currently in 28 day outpatient substance abuse center for opioid addiction He is asking for prescription for nicotine patches He has a form for MD to fill out for prescribed medications given today for his treatment program Patient reports on his PHQ-9 feeling suicidal for 1/2 the days in 2 weeks

## 2015-02-17 NOTE — Discharge Instructions (Signed)
You were seen today for a skin lesion on her face that has been there for 3 years. At this time it does not appear infected. You need to follow-up with a primary physician for dermatology referral. [facility] were given [facility] for [facility].

## 2015-02-17 NOTE — ED Provider Notes (Signed)
CSN: [id number]     Arrival date & time 02/16/15  1829 History   By signing my name below, I, Victor Gomez, attest that this documentation has been prepared under the direction and in the presence of Arby Barrette, MD.  Electronically Signed: Arlan Gomez, ED Scribe. 02/17/2015. 1:43 AM.   Chief Complaint  Patient presents with  . Rash   The history is provided by the patient. No language interpreter was used.    HPI Comments: Victor Gomez is a 40 y.o. male without any pertinent past medical history who presents to the Emergency Department complaining of an ongoing, persistent, irritating rash to the R forehead x 3 years. No aggravating or alleviating factors at this time. No OTC medications or home remedies attempted prior to arrival. Pt states he has been without insurance coverage for some time which prevented him from coming in for evaluation. Pt is now covered under medicare and is requesting a referral to see a specialist as cancer runs on both sides of his family. No known allergies to medications.  PCP: [name], MD    Past Medical History  Diagnosis Date  . Methadone use (HCC)     clinic each day  . Hepatitis C    Past Surgical History  Procedure Laterality Date  . Hernia repair     No family history on file. Social History  Substance Use Topics  . Smoking status: Current Every Day Smoker -- 0.50 packs/day    Types: Cigarettes  . Smokeless tobacco: Never Used  . Alcohol Use: Yes    Review of Systems  Constitutional: Negative for fever and chills.  Respiratory: Negative for cough.   Musculoskeletal: Negative for arthralgias.  Skin: Positive for rash.  Psychiatric/Behavioral: Negative for confusion.  All other systems reviewed and are negative.     Allergies  Review of patient's allergies indicates no known allergies.  Home Medications   Prior to Admission medications   Medication Sig Start Date End Date Taking? Authorizing Provider  hydrOXYzine  (ATARAX/VISTARIL) 25 MG tablet Take 1 tablet (25 mg total) by mouth every 6 (six) hours as needed for anxiety. 02/15/15   [name], FNP  nicotine (NICODERM CQ - DOSED IN MG/24 HOURS) 21 mg/24hr patch Place 1 patch (21 mg total) onto the skin daily. 02/15/15   [name], FNP  QUEtiapine (SEROQUEL) 100 MG tablet Take 1 tablet (100 mg total) by mouth at bedtime. 02/15/15   [name], FNP  traZODone (DESYREL) 50 MG tablet Take 1 tablet (50 mg total) by mouth at bedtime as needed for sleep. 02/15/15   [name], FNP   Triage Vitals: BP 118/76 mmHg  Pulse 72  Temp(Src) 98.3 F (36.8 C) (Oral)  Resp 16  Ht 6' (1.829 m)  Wt 211 lb (95.709 kg)  BMI 28.61 kg/m2  SpO2 99%   Physical Exam  Constitutional: He is oriented to person, place, and time. He appears well-developed and well-nourished.   Disheveled  HENT:  Head: Normocephalic and atraumatic.  Cardiovascular: Normal rate and regular rhythm.   Pulmonary/Chest: Effort normal. No respiratory distress.  Musculoskeletal: He exhibits no edema.  Neurological: He is alert and oriented to person, place, and time.  Skin: Skin is warm and dry.   1 cm circular lesion over the right temple with well demarcated borders, no erythema, slightly depressed in the middle, multiple excoriations  Psychiatric: He has a normal mood and affect.  Nursing note and vitals reviewed.  ED Course  Procedures (including critical care time)  DIAGNOSTIC STUDIES: Oxygen Saturation is 99% on RA, Normal by my interpretation.    COORDINATION OF CARE: 1:38 AM-Discussed treatment plan with pt at bedside and pt agreed to plan.     Labs Review Labs Reviewed - No data to display  Imaging Review No results found. I have personally reviewed and evaluated these images and lab results as part of my medical decision-making.   EKG Interpretation None      MDM   Final diagnoses:  Skin lesion    Patient presents with a skin lesion for the last 3 years.  Reports that he just got Medicaid and wanted to get it checked out. It does not appear infected. It is somewhat circular and well demarcated. Given duration of its presence,  Patient likely needs a formal evaluation by dermatology and biopsy. He will be given In wellness follow-up. He will need referral to dermatology from a primary physician.  After history, exam, and medical workup I feel the patient has been appropriately medically screened and is safe for discharge home. Pertinent diagnoses were discussed with the patient. Patient was given return precautions.  I personally performed the services described in this documentation, which was scribed in my presence. The recorded information has been reviewed and is accurate.   [name], MD 02/17/15 [phone]

## 2015-02-17 NOTE — Progress Notes (Signed)
Subjective:    Patient ID: Victor Gomez, male    DOB: 1975-05-11, 40 y.o.   MRN: [id number]  HPI 40 year old male with a history of major depressive disorder, polysubstance abuse, suicidal ideation recently discharged [facility] after hospitalization for suicidal ideation and intent from 02/02/15 to 02/15/15. He was admitted to inpatient [facility] at [facility] on 02/16/15. He currently smokes 1 pack of cigarettes per day and is requesting help in quitting smoking.  Complains of a right temporal lesion which has failed to heal over the last 3 years has increased in size. Lesion is not painful and he denies a history of trauma.  Past Medical History  Diagnosis Date  . Methadone use (HCC)     clinic each day  . Hepatitis C     Past Surgical History  Procedure Laterality Date  . Hernia repair      Social History   Social History  . Marital Status: Legally Separated    Spouse Name: N/A  . Number of Children: N/A  . Years of Education: N/A   Occupational History  . Not on file.   Social History Main Topics  . Smoking status: Current Every Day Smoker -- 0.50 packs/day    Types: Cigarettes  . Smokeless tobacco: Never Used  . Alcohol Use: Yes  . Drug Use: No     Comment: heroin use, last use 10/15 Meth, opiates 02/17/15 states no drugs for 4 months  . Sexual Activity: Not on file   Other Topics Concern  . Not on file   Social History Narrative    No Known Allergies  Current Outpatient Prescriptions on File Prior to Visit  Medication Sig Dispense Refill  . hydrOXYzine (ATARAX/VISTARIL) 25 MG tablet Take 1 tablet (25 mg total) by mouth every 6 (six) hours as needed for anxiety. 30 tablet 0  . QUEtiapine (SEROQUEL) 100 MG tablet Take 1 tablet (100 mg total) by mouth at bedtime. 30 tablet 0  . traZODone (DESYREL) 50 MG tablet Take 1 tablet (50 mg total) by mouth at bedtime as needed for sleep. 30 tablet 0   No current facility-administered  medications on file prior to visit.      Review of Systems  Constitutional: Negative for activity change and appetite change.  HENT: Negative for sinus pressure and sore throat.   Eyes: Negative for visual disturbance.  Respiratory: Negative for cough, chest tightness and shortness of breath.   Cardiovascular: Negative for chest pain and leg swelling.  Gastrointestinal: Negative for abdominal pain, diarrhea, constipation and abdominal distention.  Endocrine: Negative.   Genitourinary: Negative for dysuria.  Musculoskeletal: Negative for myalgias and joint swelling.  Skin:       See history of present illness  Allergic/Immunologic: Negative.   Neurological: Negative for weakness, light-headedness and numbness.  Psychiatric/Behavioral: Positive for suicidal ideas and dysphoric mood.       Objective: Filed Vitals:   02/17/15 1555  BP: 125/78  Pulse: 74  Temp: 97.8 F (36.6 C)  Resp: 13  Height: 6' (1.829 m)  Weight: 210 lb 3.2 oz (95.346 kg)  SpO2: 95%      Physical Exam  Constitutional: He is oriented to person, place, and time. He appears well-developed and well-nourished.  Cardiovascular: Normal rate, normal heart sounds and intact distal pulses.   No murmur heard. Pulmonary/Chest: Effort normal and breath sounds normal. He has no wheezes. He has no rales. He exhibits no tenderness.  Abdominal: Soft. Bowel sounds are  normal. He exhibits no distension and no mass. There is no tenderness.  Musculoskeletal: Normal range of motion.  Neurological: He is alert and oriented to person, place, and time.  Skin: Rash (rash with irregular borders and some depression on the right temporal region which is not tender.) noted.        Depression screen [facility] 2/9 02/17/2015 05/13/2013  Decreased Interest 1 1  Down, Depressed, Hopeless 1 1  PHQ - 2 Score 2 2  Altered sleeping 2 1  Tired, decreased energy 1 1  Change in appetite 2 0  Feeling bad or failure about yourself  1 1    Trouble concentrating 3 1  Moving slowly or fidgety/restless 1 0  Suicidal thoughts 2 0  PHQ-9 Score 14 6         Assessment & Plan:  Rash: Referred to dermatology as this is concerning for malignancy due to chronic nonhealing nature.  Polysubstance abuse: Currently in inpatient rehabilitation  Prescribe nicotine patches for smoking cessation  Major depressive disorder: LCSW called in to see the patient due to suicidal thoughts (which are related to social circumstances) but he has not intents at this time. He will need assistance with accommodation and community resources once he gets discharged from [facility] Currently being managed by mental health   This note has been created with Education officer, environmental. Any transcriptional errors are unintentional.

## 2015-03-31 ENCOUNTER — Ambulatory Visit: Payer: Self-pay

## 2015-10-11 ENCOUNTER — Ambulatory Visit: Payer: Self-pay | Attending: Family Medicine | Admitting: Family Medicine

## 2015-10-11 ENCOUNTER — Encounter: Payer: Self-pay | Admitting: Family Medicine

## 2015-10-11 VITALS — BP 109/68 | HR 80 | Temp 98.0°F | Ht 72.0 in | Wt 207.4 lb

## 2015-10-11 DIAGNOSIS — F332 Major depressive disorder, recurrent severe without psychotic features: Secondary | ICD-10-CM

## 2015-10-11 DIAGNOSIS — F1910 Other psychoactive substance abuse, uncomplicated: Secondary | ICD-10-CM | POA: Insufficient documentation

## 2015-10-11 DIAGNOSIS — F1920 Other psychoactive substance dependence, uncomplicated: Secondary | ICD-10-CM

## 2015-10-11 DIAGNOSIS — R21 Rash and other nonspecific skin eruption: Secondary | ICD-10-CM | POA: Insufficient documentation

## 2015-10-11 DIAGNOSIS — F329 Major depressive disorder, single episode, unspecified: Secondary | ICD-10-CM | POA: Insufficient documentation

## 2015-10-11 DIAGNOSIS — L98499 Non-pressure chronic ulcer of skin of other sites with unspecified severity: Secondary | ICD-10-CM | POA: Insufficient documentation

## 2015-10-11 DIAGNOSIS — F1120 Opioid dependence, uncomplicated: Secondary | ICD-10-CM

## 2015-10-11 DIAGNOSIS — L98491 Non-pressure chronic ulcer of skin of other sites limited to breakdown of skin: Secondary | ICD-10-CM

## 2015-10-11 NOTE — Progress Notes (Signed)
[facility] [place] [facility] last three [facility] Would like to apply for the orange

## 2015-10-11 NOTE — Progress Notes (Signed)
   Subjective:    Patient ID: Victor Gomez, male    DOB: 1975-05-06, 40 y.o.   MRN: [id number]  HPI 40 year old male with a history of major depressive disorder, polysubstance abuse (status post inpatient rehabilitation) who comes into the clinic for a follow-up visit.  He [facility] and then subsequently went to a [facility] based rehabilitation facility for [facility] days; he was also taken off all his antidepressants. He informs me he is clean and has not relapsed into using any drugs but has had a few relapses with drinking alcohol.  He had complained of a 3 year history of a right temporal lesion which has failed to heal at his last visit for which I had referred him to dermatology however he was unable to make that appointment due to being in inpatient rehabilitation.  Past Medical History:  Diagnosis Date  . Hepatitis C   . Methadone use [facility](HCC)    clinic each day    Past Surgical History:  Procedure Laterality Date  . HERNIA REPAIR       No Known Allergies  Current Outpatient Prescriptions on File Prior to Visit  Medication Sig Dispense Refill  . nicotine (NICODERM CQ - DOSED IN MG/24 HOURS) 21 mg/24hr patch Place 1 patch (21 mg total) onto the skin daily. (Patient not taking: Reported on 10/11/2015) 28 patch 1   No current facility-administered medications on file prior to visit.       Review of Systems Constitutional: Negative for activity change and appetite change.  HENT: Negative for sinus pressure and sore throat.   Eyes: Negative for visual disturbance.  Respiratory: Negative for cough, chest tightness and shortness of breath.   Cardiovascular: Negative for chest pain and leg swelling.  Gastrointestinal: Negative for abdominal pain, diarrhea, constipation and abdominal distention.  Endocrine: Negative.   Genitourinary: Negative for dysuria.  Musculoskeletal: Negative for myalgias and joint swelling.  Skin:     See history of present illness  Allergic/Immunologic: Negative.   Neurological: Negative for weakness, light-headedness and numbness.  Psychiatric/Behavioral: Positive for suicidal ideas and dysphoric mood.     Objective: Vitals:   10/11/15 0958  BP: 109/68  Pulse: 80  Temp: 98 F (36.7 C)  TempSrc: Oral  SpO2: 98%  Weight: 207 lb 6.4 oz (94.1 kg)  Height: 6' (1.829 m)      Physical Exam Constitutional: He is oriented to person, place, and time. He appears well-developed and well-nourished.  Cardiovascular: Normal rate, normal heart sounds and intact distal pulses.   No murmur heard. Pulmonary/Chest: Effort normal and breath sounds normal. He has no wheezes. He has no rales. He exhibits no tenderness.  Abdominal: Soft. Bowel sounds are normal. He exhibits no distension and no mass. There is no tenderness.  Musculoskeletal: Normal range of motion.  Neurological: He is alert and oriented to person, place, and time.  Skin: Rash (rash with irregular borders and some depression on the right temporal region which is not tender.) noted.        Assessment & Plan:  Rash: Referred to dermatology as this is concerning for malignancy due to chronic nonhealing nature.  Polysubstance abuse: Completed in inpatient rehabilitation    Major depressive disorder: Currently off all his antidepressants as he states he does not need them  This note has been created with Education officer, environmentalDragon speech recognition software and smart phrase technology. Any transcriptional errors are unintentional.

## 2015-10-20 ENCOUNTER — Ambulatory Visit: Payer: Medicaid Other | Attending: Internal Medicine

## 2016-03-06 ENCOUNTER — Ambulatory Visit: Payer: Self-pay | Admitting: Family Medicine

## 2016-03-31 ENCOUNTER — Ambulatory Visit: Payer: Self-pay | Admitting: Family Medicine

## 2016-04-05 ENCOUNTER — Ambulatory Visit: Payer: Self-pay | Attending: Family Medicine | Admitting: Family Medicine

## 2016-04-05 ENCOUNTER — Encounter: Payer: Self-pay | Admitting: Family Medicine

## 2016-04-05 VITALS — BP 122/78 | HR 85 | Temp 98.1°F | Ht 72.0 in | Wt 217.0 lb

## 2016-04-05 DIAGNOSIS — Z85828 Personal history of other malignant neoplasm of skin: Secondary | ICD-10-CM | POA: Insufficient documentation

## 2016-04-05 DIAGNOSIS — R768 Other specified abnormal immunological findings in serum: Secondary | ICD-10-CM

## 2016-04-05 DIAGNOSIS — Z13228 Encounter for screening for other metabolic disorders: Secondary | ICD-10-CM

## 2016-04-05 DIAGNOSIS — B1920 Unspecified viral hepatitis C without hepatic coma: Secondary | ICD-10-CM | POA: Insufficient documentation

## 2016-04-05 NOTE — Progress Notes (Signed)
   Subjective:  Patient ID: Victor Gomez, male    DOB: [birth year]  Age: 41 y.o. MRN: [id number]  CC: hep c positive (would like a referral to ID) and polysubstance abuse ("clean for a year")   HPI Victor Gomez is a 41 year old male with a history of hepatitis C, previous substance abuse (status post rehabilitation and has been clean for 1 year) who presents today requesting referral to infectious disease for hep C treatment. 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  He is currently not taking any medications and has no complaints today.  Past Medical History:  Diagnosis Date  . Hepatitis C   . Methadone use [facility](HCC)    clinic each day    Past Surgical History:  Procedure Laterality Date  . HERNIA REPAIR       Outpatient Medications Prior to Visit  Medication Sig Dispense Refill  . nicotine (NICODERM CQ - DOSED IN MG/24 HOURS) 21 mg/24hr patch Place 1 patch (21 mg total) onto the skin daily. (Patient not taking: Reported on 10/11/2015) 28 patch 1   No facility-administered medications prior to visit.     ROS Review of Systems  Constitutional: Negative for activity change and appetite change.  HENT: Negative for sinus pressure and sore throat.   Respiratory: Negative for chest tightness, shortness of breath and wheezing.   Cardiovascular: Negative for chest pain and palpitations.  Gastrointestinal: Negative for abdominal distention, abdominal pain and constipation.  Genitourinary: Negative.   Musculoskeletal: Negative.   Psychiatric/Behavioral: Negative for behavioral problems and dysphoric mood.    Objective:  BP 122/78 (BP Location: Right Arm, Patient Position: Sitting, Cuff Size: Small)   Pulse 85   Temp 98.1 F (36.7 C) (Oral)   Ht 6' (1.829 m)   Wt 217 lb (98.4 kg)   SpO2 98%   BMI 29.43 kg/m   BP/Weight 04/05/2016 10/11/2015 02/17/2015    Systolic BP 122 109 125  Diastolic BP 78 68 78  Wt. (Lbs) 217 207.4 210.2  BMI 29.43 28.13 28.5  Some encounter information is confidential and restricted. Go to Review Flowsheets activity to see all data.      Physical Exam  Constitutional: He is oriented to person, place, and time. He appears well-developed and well-nourished.  Cardiovascular: Normal rate, normal heart sounds and intact distal pulses.   No murmur heard. Pulmonary/Chest: Effort normal and breath sounds normal. He has no wheezes. He has no rales. He exhibits no tenderness.  Abdominal: Soft. Bowel sounds are normal. He exhibits no distension and no mass. There is no tenderness.  Musculoskeletal: Normal range of motion.  Neurological: He is alert and oriented to person, place, and time.     Assessment & Plan:   1. Hepatitis C antibody test positive He has been advised to apply [facility] the [facility] discount to facilitate the referral to infectious disease  2. Screening for metabolic disorder - COMPLETE METABOLIC PANEL WITH GFR; Future - Lipid Panel w/reflex Direct LDL; Future  3. History of basal cell carcinoma Status post excision   No orders of the defined types were placed in this encounter.   Follow-up: Return in about 6 months (around 10/03/2016) for Coordination of care.   Jaclyn ShaggyEnobong Amao MD

## 2016-04-17 ENCOUNTER — Ambulatory Visit: Payer: Self-pay | Attending: Family Medicine

## 2016-04-19 ENCOUNTER — Other Ambulatory Visit: Payer: Self-pay

## 2016-05-08 ENCOUNTER — Ambulatory Visit: Payer: Medicaid Other | Attending: Family Medicine

## 2016-05-08 DIAGNOSIS — Z13228 Encounter for screening for other metabolic disorders: Secondary | ICD-10-CM

## 2016-05-08 DIAGNOSIS — Z13220 Encounter for screening for lipoid disorders: Secondary | ICD-10-CM

## 2016-05-08 NOTE — Progress Notes (Signed)
Patient here for lab visit only 

## 2016-05-09 ENCOUNTER — Other Ambulatory Visit: Payer: Self-pay | Admitting: Family Medicine

## 2016-05-09 DIAGNOSIS — E875 Hyperkalemia: Secondary | ICD-10-CM

## 2016-05-09 LAB — LIPID PANEL
CHOLESTEROL TOTAL: 295 mg/dL — AB (ref 100–199)
Chol/HDL Ratio: 7 ratio units — ABNORMAL HIGH (ref 0.0–5.0)
HDL: 42 mg/dL (ref >39)
LDL Calculated: 212 mg/dL — ABNORMAL HIGH (ref 0–99)
TRIGLYCERIDES: 206 mg/dL — AB (ref 0–149)
VLDL CHOLESTEROL CAL: 41 mg/dL — AB (ref 5–40)

## 2016-05-09 LAB — CMP14+EGFR
A/G RATIO: 1.7 (ref 1.2–2.2)
ALK PHOS: 81 IU/L (ref 39–117)
ALT: 39 IU/L (ref 0–44)
AST: 56 IU/L — AB (ref 0–40)
Albumin: 4.8 g/dL (ref 3.5–5.5)
BILIRUBIN TOTAL: 0.4 mg/dL (ref 0.0–1.2)
BUN/Creatinine Ratio: 12 (ref 9–20)
BUN: 11 mg/dL (ref 6–20)
CHLORIDE: 101 mmol/L (ref 96–106)
CO2: 25 mmol/L (ref 18–29)
Calcium: 9.8 mg/dL (ref 8.7–10.2)
Creatinine, Ser: 0.9 mg/dL (ref 0.76–1.27)
GFR calc Af Amer: 125 mL/min/{1.73_m2} (ref >59)
GFR calc non Af Amer: 108 mL/min/{1.73_m2} (ref >59)
Globulin, Total: 2.9 g/dL (ref 1.5–4.5)
Glucose: 91 mg/dL (ref 65–99)
POTASSIUM: 5.3 mmol/L — AB (ref 3.5–5.2)
Sodium: 140 mmol/L (ref 134–144)
TOTAL PROTEIN: 7.7 g/dL (ref 6.0–8.5)

## 2016-05-17 ENCOUNTER — Telehealth: Payer: Self-pay

## 2016-05-17 NOTE — Telephone Encounter (Signed)
-----   Message from Jaclyn Shaggy, MD sent at 05/09/2016  3:08 PM EDT ----- Cholesterol is significantly elevated. Please encourage low-cholesterol diet, exercise and I will need to repeat this at his next visit. Potassium is slightly elevated; please schedule him for repeat potassium level in 3 weeks; I have placed an order for this.

## 2016-05-17 NOTE — Telephone Encounter (Signed)
Writer called patient to discuss lab results and LVM for patient to call back.

## 2016-05-18 ENCOUNTER — Telehealth: Payer: Self-pay | Admitting: Family Medicine

## 2016-05-18 NOTE — Progress Notes (Signed)
Writer mailed lab results along with a letter to patient.  Included in the letter is a request for patient to call and set up a repeat potassium lab in 3 weeks.

## 2016-05-18 NOTE — Telephone Encounter (Signed)
Patient returning call from nurse to discuss lab results.  Patient also wants to follow up on a referral

## 2016-05-19 ENCOUNTER — Telehealth: Payer: Self-pay

## 2016-05-19 NOTE — Telephone Encounter (Signed)
Writer was finally able to connect with patient and discussed lab results.  Patient stated understanding and an appt was set up for repeat potassium lab as requested by Dr. Amao. Patient is requesting to have MD place a referral  to have his" hepatitis C" treated.  He has the orange card and wanted to know if he could see ID for the hepatitis?     

## 2016-05-19 NOTE — Telephone Encounter (Deleted)
Writer was finally able to connect with patient and discussed lab results.  Patient stated understanding and an appt was set up for repeat potassium lab as requested by Dr. Venetia Night. Patient is requesting to have MD place a referral  to have his" hepatitis C" treated.  He has the orange card and wanted to know if he could see ID for the hepatitis?

## 2016-05-24 ENCOUNTER — Other Ambulatory Visit: Payer: Self-pay | Admitting: Family Medicine

## 2016-05-24 DIAGNOSIS — R768 Other specified abnormal immunological findings in serum: Secondary | ICD-10-CM

## 2016-05-29 ENCOUNTER — Other Ambulatory Visit: Payer: Self-pay

## 2016-06-09 ENCOUNTER — Other Ambulatory Visit: Payer: Self-pay

## 2016-06-13 ENCOUNTER — Other Ambulatory Visit: Payer: Self-pay

## 2016-07-12 ENCOUNTER — Encounter: Payer: Self-pay | Admitting: Internal Medicine

## 2016-07-13 ENCOUNTER — Telehealth: Payer: Self-pay | Admitting: *Deleted

## 2016-07-13 NOTE — Telephone Encounter (Signed)
Patient no showed his new patient appointment yesterday with Dr. Luciana Axeomer. Referring office notified

## 2017-04-10 ENCOUNTER — Encounter (HOSPITAL_COMMUNITY): Payer: Self-pay | Admitting: Emergency Medicine

## 2017-04-10 ENCOUNTER — Emergency Department (HOSPITAL_COMMUNITY): Payer: Self-pay

## 2017-04-10 ENCOUNTER — Emergency Department (HOSPITAL_COMMUNITY)
Admission: EM | Admit: 2017-04-10 | Discharge: 2017-04-10 | Disposition: A | Discharge status: Home / Self Care | Payer: Self-pay | Attending: Emergency Medicine | Admitting: Emergency Medicine

## 2017-04-10 DIAGNOSIS — Y999 Unspecified external cause status: Secondary | ICD-10-CM | POA: Insufficient documentation

## 2017-04-10 DIAGNOSIS — S82892A Other fracture of left lower leg, initial encounter for closed fracture: Secondary | ICD-10-CM

## 2017-04-10 DIAGNOSIS — Z85828 Personal history of other malignant neoplasm of skin: Secondary | ICD-10-CM | POA: Insufficient documentation

## 2017-04-10 DIAGNOSIS — F17210 Nicotine dependence, cigarettes, uncomplicated: Secondary | ICD-10-CM | POA: Insufficient documentation

## 2017-04-10 DIAGNOSIS — W11XXXA Fall on and from ladder, initial encounter: Secondary | ICD-10-CM | POA: Insufficient documentation

## 2017-04-10 DIAGNOSIS — Y939 Activity, unspecified: Secondary | ICD-10-CM | POA: Insufficient documentation

## 2017-04-10 DIAGNOSIS — S82832A Other fracture of upper and lower end of left fibula, initial encounter for closed fracture: Secondary | ICD-10-CM | POA: Insufficient documentation

## 2017-04-10 DIAGNOSIS — S92325A Nondisplaced fracture of second metatarsal bone, left foot, initial encounter for closed fracture: Secondary | ICD-10-CM | POA: Insufficient documentation

## 2017-04-10 DIAGNOSIS — Y929 Unspecified place or not applicable: Secondary | ICD-10-CM | POA: Insufficient documentation

## 2017-04-10 MED ORDER — HYDROCODONE-ACETAMINOPHEN 5-325 MG PO TABS
1.0000 | ORAL_TABLET | Freq: Once | ORAL | Status: AC
  Administered 2017-04-10: 1 via ORAL
  Filled 2017-04-10: qty 1

## 2017-04-10 MED ORDER — ACETAMINOPHEN ER 650 MG PO TBCR: 650.0000 mg | EXTENDED_RELEASE_TABLET | Freq: Three times a day (TID) | ORAL | 0 refills | Status: DC | PRN

## 2017-04-10 MED ORDER — HYDROCODONE-ACETAMINOPHEN 5-325 MG PO TABS: 1.0000 | ORAL_TABLET | Freq: Three times a day (TID) | ORAL | 0 refills | Status: DC | PRN

## 2017-04-10 NOTE — ED Triage Notes (Signed)
Patient reports fell off ladder and hurt left ankle and foot.

## 2017-04-10 NOTE — ED Notes (Signed)
ED Provider at bedside. 

## 2017-04-10 NOTE — Discharge Instructions (Signed)
X-rays of your ankle shows a small fracture over your ankle bone. Keep the leg elevated. He will need to see the orthopedist in 1 week. We recommend nonweightbearing status until you are cleared by orthopedist, because some of these fractures might require surgical care.

## 2017-04-11 NOTE — ED Provider Notes (Signed)
[facility]-EMERGENCY DEPT Provider Note   CSN: [id number] Arrival date & time: 04/10/17  1102     History   Chief Complaint Chief Complaint  Patient presents with  . Fall  . Ankle Pain    HPI Victor Gomez is a 42 y.o. male.  HPI  42 year old male comes in after a fall.  Patient was helping his friends, and was on a ladder when he had a fall.  Patient fell onto his left foot.  Patient denies any direct head trauma, pt has no headaches, nausea, vomiting, seizures, loss of consciousness or new visual complains, weakness, numbness, dizziness or gait instability.  Patient also denies neck pain, back pain.  Patient's pain is primarily on the left foot.   Past Medical History:  Diagnosis Date  . Hepatitis C   . 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 History of basal cell carcinoma 04/05/2016  . Non-healing ulcer (HCC) 10/11/2015  . MDD (major depressive disorder), recurrent severe, without psychosis (HCC) 02/15/2015  . Cocaine use disorder, moderate, dependence (HCC) 09/17/2014  . Substance induced mood disorder (HCC) 09/17/2014  . Polysubstance abuse (HCC)   . Polysubstance dependence including opioid type drug, continuous use (HCC) 12/06/2013  . Hepatitis C antibody test positive 03/13/2013  . Elevated transaminase level 03/13/2013    Past Surgical History:  Procedure Laterality Date  . HERNIA REPAIR         Home Medications    Prior to Admission medications   Medication Sig Start Date End Date Taking? Authorizing Provider  ibuprofen (ADVIL,MOTRIN) 200 MG tablet Take 600 mg by mouth every 6 (six) hours as needed.    Yes [provider]  acetaminophen (TYLENOL 8 HOUR) 650 MG CR tablet Take 1 tablet (650 mg total) by mouth every 8 (eight) hours as needed. 04/10/17   Derwood Kaplan, MD  HYDROcodone-acetaminophen (NORCO/VICODIN) 5-325 MG tablet Take 1 tablet by mouth every 8  (eight) hours as needed. 04/10/17   Derwood Kaplan, MD    Family History No family history on file.  Social History Social History   Tobacco Use  . Smoking status: Current Every Day Smoker    Packs/day: 0.50    Types: Cigarettes  . Smokeless tobacco: Never Used  Substance Use Topics  . Alcohol use: No  . Drug use: Yes    Types: Marijuana, "Crack" cocaine, Methamphetamines    Comment: last time he used 04/2014     Allergies   Patient has no known allergies.   Review of Systems Review of Systems  Musculoskeletal: Positive for arthralgias, gait problem and joint swelling. Negative for back pain.  Skin: Negative for wound.  Neurological: Negative for headaches.  Hematological: Does not bruise/bleed easily.     Physical Exam Updated Vital Signs BP (!) 173/104   Pulse 94   Temp 97.6 F (36.4 C) (Oral)   Resp 16   SpO2 99%   Physical Exam  Constitutional: He is oriented to person, place, and time. He appears well-developed.  HENT:  Head: Atraumatic.  Neck: Neck supple.  Cardiovascular: Normal rate.  Pulmonary/Chest: Effort normal.  Musculoskeletal: He exhibits tenderness.  L ankle tenderness to palpation. Pt already in cam walker boot when I saw him.  Head to toe evaluation shows no hematoma, bleeding of the scalp, no facial abrasions, no spine step offs, crepitus of the chest or neck, no tenderness to palpation of  the bilateral upper and lower extremities, no gross deformities, no chest tenderness, no pelvic pain.  Pt has tenderness over the lumbar region No step offs, no erythema.    Neurological: He is alert and oriented to person, place, and time.  Skin: Skin is warm.  Nursing note and vitals reviewed.    ED Treatments / Results  Labs (all labs ordered are listed, but only abnormal results are displayed) Labs Reviewed - No data to display  EKG  EKG Interpretation None       Radiology Dg Ankle Complete Left  Result Date: 04/10/2017 CLINICAL  DATA:  Status post fall.  Pain. EXAM: LEFT ANKLE COMPLETE - 3+ VIEW COMPARISON:  None. FINDINGS: Oblique fracture of the distal fibular metaphysis without displacement or angulation. Mild overlying soft tissue swelling. No other fracture or dislocation. Ankle mortise is intact. IMPRESSION: Acute nondisplaced oblique fracture of the distal fibular metaphysis. Electronically Signed   By: Elige KoHetal  Patel   On: 04/10/2017 11:46   Dg Foot Complete Left  Result Date: 04/10/2017 CLINICAL DATA:  Status post fall.  Pain. EXAM: LEFT FOOT - COMPLETE 3+ VIEW COMPARISON:  None. FINDINGS: Comminuted nondisplaced fracture of the base of the second metatarsal. No other fracture or dislocation. Alignment is anatomic. Soft tissue swelling along the dorsal aspect of the left foot. IMPRESSION: 1. Comminuted nondisplaced fracture of the base of the second metatarsal. Normal alignment. If there is clinical concern regarding Lisfranc injury, recommend a weight-bearing view of the left foot. Electronically Signed   By: Elige KoHetal  Patel   On: 04/10/2017 11:48    Procedures Procedures (including critical care time)  Medications Ordered in ED Medications  HYDROcodone-acetaminophen (NORCO/VICODIN) 5-325 MG per tablet 1 tablet (1 tablet Oral Given 04/10/17 1317)     Initial Impression / Assessment and Plan / ED Course  I have reviewed the triage vital signs and the nursing notes.  Pertinent labs & imaging results that were available during my care of the patient were reviewed by me and considered in my medical decision making (see chart for details).     Young man with no significant medical history comes in after a mechanical fall.  Patient sustained a left ankle fracture.  We will give him pain medications to go home.  The distal fibular fracture is nondisplaced, therefore Cam walker boot and crutches have been provided.  Patient is still been advised to be non-weightbearing as much as he can tolerate until orthopedic  follow-up.  Final Clinical Impressions(s) / ED Diagnoses   Final diagnoses:  Closed fracture of left ankle, initial encounter  Closed fracture of distal end of left fibula, unspecified fracture morphology, initial encounter    ED Discharge Orders        Ordered    HYDROcodone-acetaminophen (NORCO/VICODIN) 5-325 MG tablet  Every 8 hours PRN     04/10/17 1308    acetaminophen (TYLENOL 8 HOUR) 650 MG CR tablet  Every 8 hours PRN     04/10/17 1308       Derwood KaplanNanavati, Domenica Weightman, MD 04/11/17 0725

## 2017-04-26 ENCOUNTER — Other Ambulatory Visit: Payer: Self-pay

## 2017-04-26 ENCOUNTER — Ambulatory Visit: Payer: Self-pay | Attending: Family Medicine | Admitting: Physician Assistant

## 2017-04-26 VITALS — BP 128/84 | HR 79 | Temp 97.8°F | Resp 16 | Ht 72.0 in | Wt 214.0 lb

## 2017-04-26 DIAGNOSIS — B1920 Unspecified viral hepatitis C without hepatic coma: Secondary | ICD-10-CM | POA: Insufficient documentation

## 2017-04-26 DIAGNOSIS — M62838 Other muscle spasm: Secondary | ICD-10-CM | POA: Insufficient documentation

## 2017-04-26 DIAGNOSIS — Z87891 Personal history of nicotine dependence: Secondary | ICD-10-CM | POA: Insufficient documentation

## 2017-04-26 DIAGNOSIS — W11XXXA Fall on and from ladder, initial encounter: Secondary | ICD-10-CM | POA: Insufficient documentation

## 2017-04-26 DIAGNOSIS — F17200 Nicotine dependence, unspecified, uncomplicated: Secondary | ICD-10-CM

## 2017-04-26 DIAGNOSIS — S82892A Other fracture of left lower leg, initial encounter for closed fracture: Secondary | ICD-10-CM | POA: Insufficient documentation

## 2017-04-26 DIAGNOSIS — S82892D Other fracture of left lower leg, subsequent encounter for closed fracture with routine healing: Secondary | ICD-10-CM

## 2017-04-26 DIAGNOSIS — S99292A Other physeal fracture of phalanx of left toe, initial encounter for closed fracture: Secondary | ICD-10-CM

## 2017-04-26 MED ORDER — HYDROCODONE-ACETAMINOPHEN 5-325 MG PO TABS: 1.0000 | ORAL_TABLET | Freq: Three times a day (TID) | ORAL | 0 refills | Status: DC | PRN

## 2017-04-26 MED ORDER — METHOCARBAMOL 500 MG PO TABS: 500.0000 mg | ORAL_TABLET | Freq: Four times a day (QID) | ORAL | 0 refills | Status: DC

## 2017-04-26 NOTE — Patient Instructions (Signed)
non weight bearing for now

## 2017-04-26 NOTE — Progress Notes (Signed)
Chief Complaint: ED follow up from 04/10/17  Subjective: This is a 42 year old male with a history of hepatitis C, previous cocaine abuse (clean > 1 yr), hx basal cell carcinoma and smoking who presents to the emergency department on 04/10/2017 at [facility] after a fall from a ladder sustaining injury to the left foot. He was hypertensive upon presentation. An x-ray of the left ankle showed acute nondisplaced oblique fracture of the distal fibular metaphysis and a left foot x-ray showed a common minute. Nondisplaced fracture at the base of the second metatarsal. He was treated with Vicodin and anti-inflammatories. He was given a Personal assistantCam Walker boot and crutches. He is nonweightbearing.  Is difficult for him to sleep. He is in a lot of pain still. Having some muscle spasms in his lower left leg because of the boot and positioning.Trying to be compliant with the nonweightbearing portion. Taking anti-inflammatories. Out of Vicodin.   ROS:  GEN: denies fever or chills, denies change in weight LUNGS: denies SHOB, dyspnea, PND, orthopnea CV: denies CP or palpitations ABD: denies abd pain, N or V EXT: + muscle spasms no swelling; + pain in lower ext, no weakness NEURO: denies numbness or tingling, denies sz, stroke or TIA   Objective:  Vitals:   04/26/17 1007  BP: 128/84  Pulse: 79  Resp: 16  Temp: 97.8 F (36.6 C)  TempSrc: Oral  SpO2: 96%  Weight: 214 lb (97.1 kg)  Height: 6' (1.829 m)    Physical Exam:  General: in no acute distress. Lungs: Clear to auscultation bilaterally. Abdomen: Soft, nontender, nondistended, positive bowel sounds. Extremities: No clubbing cyanosis or edema with positive pedal pulses. Swelling. Cam walker boot to left foot.  Neuro: Alert, awake, oriented x3, nonfocal.   Medications: Prior to Admission medications   Medication Sig Start Date End Date Taking? Authorizing Provider  acetaminophen (TYLENOL 8 HOUR) 650 MG CR tablet Take 1 tablet (650 mg total) by  mouth every 8 (eight) hours as needed. 04/10/17  Yes Derwood KaplanNanavati, Ankit, MD  ibuprofen (ADVIL,MOTRIN) 200 MG tablet Take 600 mg by mouth every 6 (six) hours as needed.    Yes [provider]  HYDROcodone-acetaminophen (NORCO/VICODIN) 5-325 MG tablet Take 1 tablet by mouth every 8 (eight) hours as needed. 04/26/17   Vivianne MasterNoel, Tiffany S, PA-C  methocarbamol (ROBAXIN) 500 MG tablet Take 1 tablet (500 mg total) by mouth 4 (four) times daily. 04/26/17   Vivianne MasterNoel, Tiffany S, PA-C    Assessment: 1. S/p fall with fx left ankle and 2nd toe 2. Muscle Spasms LLE  Plan: Refill Viocoidn Cont NSAIDS Cont Cam walker boot, crutches and non weight bearing Ortho referral Robaxin  Follow up:4 weeks  The patient was given clear instructions to go to ER or return to medical center if symptoms don't improve, worsen or new problems develop. The patient verbalized understanding. The patient was told to call to get lab results if they haven't heard anything in the next week.   This note has been created with Education officer, environmentalDragon speech recognition software and smart phrase technology. Any transcriptional errors are unintentional.   Scot Juniffany Noel, PA-C 04/26/2017, 10:38 AM

## 2017-04-26 NOTE — Progress Notes (Signed)
F/u left leg fx Pain 7/10  Unable to sleep d/t pain

## 2017-05-03 ENCOUNTER — Telehealth: Payer: Self-pay | Admitting: Family Medicine

## 2017-05-03 NOTE — Telephone Encounter (Signed)
I Spoke to patien's Mom & him and they are aware that they need to apply for cafa to be able to refer him to an Orthopedic Specialist

## 2017-05-03 NOTE — Telephone Encounter (Signed)
Patients mother called and requested for some more information on how her son can get the blue card due to their situation. Please fu at your earliest convenience.

## 2017-05-03 NOTE — Telephone Encounter (Signed)
Patients mother called and asked about his referral. Please fu at your earliest convenience.

## 2017-05-21 ENCOUNTER — Telehealth: Payer: Self-pay | Admitting: Family Medicine

## 2017-05-21 ENCOUNTER — Ambulatory Visit: Payer: Self-pay | Attending: Family Medicine

## 2017-05-21 NOTE — Telephone Encounter (Signed)
Patient called to check on referral. Please fu at your earliest convenience.

## 2017-05-31 ENCOUNTER — Ambulatory Visit: Payer: Self-pay | Attending: Family Medicine | Admitting: Family Medicine

## 2017-05-31 ENCOUNTER — Encounter: Payer: Self-pay | Admitting: Family Medicine

## 2017-05-31 VITALS — BP 146/93 | HR 76 | Temp 97.4°F | Ht 72.0 in | Wt 213.0 lb

## 2017-05-31 DIAGNOSIS — S92325D Nondisplaced fracture of second metatarsal bone, left foot, subsequent encounter for fracture with routine healing: Secondary | ICD-10-CM | POA: Insufficient documentation

## 2017-05-31 DIAGNOSIS — S82209S Unspecified fracture of shaft of unspecified tibia, sequela: Secondary | ICD-10-CM

## 2017-05-31 DIAGNOSIS — S92309A Fracture of unspecified metatarsal bone(s), unspecified foot, initial encounter for closed fracture: Secondary | ICD-10-CM | POA: Insufficient documentation

## 2017-05-31 DIAGNOSIS — S82832D Other fracture of upper and lower end of left fibula, subsequent encounter for closed fracture with routine healing: Secondary | ICD-10-CM | POA: Insufficient documentation

## 2017-05-31 DIAGNOSIS — X58XXXD Exposure to other specified factors, subsequent encounter: Secondary | ICD-10-CM | POA: Insufficient documentation

## 2017-05-31 DIAGNOSIS — S82409A Unspecified fracture of shaft of unspecified fibula, initial encounter for closed fracture: Secondary | ICD-10-CM | POA: Insufficient documentation

## 2017-05-31 HISTORY — DX: Unspecified fracture of shaft of unspecified tibia, sequela: S82.209S

## 2017-05-31 MED ORDER — ACETAMINOPHEN-CODEINE #3 300-30 MG PO TABS: 1.0000 | ORAL_TABLET | Freq: Two times a day (BID) | ORAL | 0 refills | Status: DC | PRN

## 2017-05-31 MED ORDER — METHOCARBAMOL 500 MG PO TABS: 500.0000 mg | ORAL_TABLET | Freq: Four times a day (QID) | ORAL | 0 refills | Status: DC

## 2017-05-31 NOTE — Progress Notes (Signed)
Subjective:  Patient ID: Victor SprayJason L Gomez, male    DOB: 05/09/1975  Age: 42 y.o. MRN: [id number]  CC: Ankle Pain   HPI Victor Gomez is a 42 year old male with a history of hepatitis C, previous substance abuse (status post rehabilitation and has been clean for 1 year), BCC of the right temporal (status post excision), left fibular and left second metatarsal fracture which occurred on 04/10/17 for which he was seen at the ED and put in a boot. Recommendation was to see orthopedics however he has been unable to do so due to lack of medical coverage and he is working on the process of obtaining that [employer] discount to facilitate this referral. He informed me 2 weeks ago he slipped on wet stairs at home.  Complains of pain on the medial aspect of his ankle with associated throbbing and is requesting a refill of his pain medications.  Left foot x-ray 04/10/17: IMPRESSION: 1. Comminuted nondisplaced fracture of the base of the second metatarsal. Normal alignment. If there is clinical concern regarding Lisfranc injury, recommend a weight-bearing view of the left foot.  Left ankle x-ray 04/10/17: IMPRESSION: Acute nondisplaced oblique fracture of the distal fibular metaphysis.  Past Medical History:  Diagnosis Date  . Hepatitis C   . Methadone use [facility](HCC)    clinic each day    Past Surgical History:  Procedure Laterality Date  . HERNIA REPAIR      No Known Allergies   Outpatient Medications Prior to Visit  Medication Sig Dispense Refill  . acetaminophen (TYLENOL 8 HOUR) 650 MG CR tablet Take 1 tablet (650 mg total) by mouth every 8 (eight) hours as needed. (Patient not taking: Reported on 05/31/2017) 30 tablet 0  . ibuprofen (ADVIL,MOTRIN) 200 MG tablet Take 600 mg by mouth every 6 (six) hours as needed.     Marland Kitchen. HYDROcodone-acetaminophen (NORCO/VICODIN) 5-325 MG tablet Take 1 tablet by mouth every 8 (eight) hours as needed. (Patient not taking: Reported on 05/31/2017) 20 tablet 0  .  methocarbamol (ROBAXIN) 500 MG tablet Take 1 tablet (500 mg total) by mouth 4 (four) times daily. (Patient not taking: Reported on 05/31/2017) 30 tablet 0   No facility-administered medications prior to visit.     ROS Review of Systems  Constitutional: Negative for activity change and appetite change.  HENT: Negative for sinus pressure and sore throat.   Eyes: Negative for visual disturbance.  Respiratory: Negative for cough, chest tightness and shortness of breath.   Cardiovascular: Negative for chest pain and leg swelling.  Gastrointestinal: Negative for abdominal distention, abdominal pain, constipation and diarrhea.  Endocrine: Negative.   Genitourinary: Negative for dysuria.  Musculoskeletal:       See hpi  Skin: Negative for rash.  Allergic/Immunologic: Negative.   Neurological: Negative for weakness, light-headedness and numbness.  Psychiatric/Behavioral: Negative for dysphoric mood and suicidal ideas.    Objective:  BP (!) 146/93   Pulse 76   Temp (!) 97.4 F (36.3 C) (Oral)   Ht 6' (1.829 m)   Wt 213 lb (96.6 kg)   SpO2 97%   BMI 28.89 kg/m   BP/Weight 05/31/2017 04/26/2017 04/10/2017  Systolic BP 146 128 173  Diastolic BP 93 84 104  Wt. (Lbs) 213 214 -  BMI 28.89 29.02 -  Some encounter information is confidential and restricted. Go to Review Flowsheets activity to see all data.      Physical Exam  Constitutional: He is oriented to person, place, and time. He appears  well-developed and well-nourished.  Cardiovascular: Normal rate, normal heart sounds and intact distal pulses.  No murmur heard. Pulmonary/Chest: Effort normal and breath sounds normal. He has no wheezes. He has no rales. He exhibits no tenderness.  Abdominal: Soft. Bowel sounds are normal. He exhibits no distension and no mass. There is no tenderness.  Musculoskeletal:  Left leg in the boot  Neurological: He is alert and oriented to person, place, and time.     Assessment & Plan:   1.  Closed fracture of distal end of left fibula with routine healing, unspecified fracture morphology, subsequent encounter Needs to see orthopedics ASAP Financial counselor not in clinic and we unable to verify if he has been approved for the [employer] financial discount which he needs to see orthopedics. We will follow-up on this as soon as she gets back to facilitate his referral - methocarbamol (ROBAXIN) 500 MG tablet; Take 1 tablet (500 mg total) by mouth 4 (four) times daily.  Dispense: 60 tablet; Refill: 0 - acetaminophen-codeine (TYLENOL #3) 300-30 MG tablet; Take 1 tablet by mouth every 12 (twelve) hours as needed for moderate pain.  Dispense: 30 tablet; Refill: 0  2. Closed nondisplaced fracture of second metatarsal bone of left foot with routine healing, subsequent encounter Fall precautions. - methocarbamol (ROBAXIN) 500 MG tablet; Take 1 tablet (500 mg total) by mouth 4 (four) times daily.  Dispense: 60 tablet; Refill: 0 - acetaminophen-codeine (TYLENOL #3) 300-30 MG tablet; Take 1 tablet by mouth every 12 (twelve) hours as needed for moderate pain.  Dispense: 30 tablet; Refill: 0   Meds ordered this encounter  Medications  . methocarbamol (ROBAXIN) 500 MG tablet    Sig: Take 1 tablet (500 mg total) by mouth 4 (four) times daily.    Dispense:  60 tablet    Refill:  0  . acetaminophen-codeine (TYLENOL #3) 300-30 MG tablet    Sig: Take 1 tablet by mouth every 12 (twelve) hours as needed for moderate pain.    Dispense:  30 tablet    Refill:  0    Follow-up: Return in about 1 month (around 06/30/2017) for Follow-up on left ankle fracture.   Hoy Register MD

## 2017-06-14 ENCOUNTER — Ambulatory Visit (INDEPENDENT_AMBULATORY_CARE_PROVIDER_SITE_OTHER): Payer: Self-pay

## 2017-06-14 ENCOUNTER — Encounter (INDEPENDENT_AMBULATORY_CARE_PROVIDER_SITE_OTHER): Payer: Self-pay | Admitting: Orthopaedic Surgery

## 2017-06-14 ENCOUNTER — Ambulatory Visit (INDEPENDENT_AMBULATORY_CARE_PROVIDER_SITE_OTHER): Payer: Self-pay | Admitting: Orthopaedic Surgery

## 2017-06-14 VITALS — BP 132/89 | HR 94 | Resp 18 | Ht 72.0 in | Wt 217.0 lb

## 2017-06-14 DIAGNOSIS — M25572 Pain in left ankle and joints of left foot: Secondary | ICD-10-CM

## 2017-06-14 NOTE — Progress Notes (Signed)
Office Visit Note   Patient: Victor Gomez           Date of Birth: [birth year]           MRN: [id number] Visit Date: 06/14/2017              Requested by: Vivianne Master, PA-C [address] [place], [place] [address] PCP: Hoy Register, MD   Assessment & Plan: Visit Diagnoses:  1. Acute left ankle pain     Plan: Healing nondisplaced lateral malleolus fracture left ankle associated with healing comminuted nondisplaced base of second metatarsal.  Discontinue crutches.  Careers adviser.  Exercises left ankle.  Office 2 weeks  Follow-Up Instructions: Return in about 2 weeks (around 06/28/2017).   Orders:  Orders Placed This Encounter  Procedures  . XR Ankle Complete Left   No orders of the defined types were placed in this encounter.     Procedures: No procedures performed   Clinical Data: No additional findings.   Subjective: Chief Complaint  Patient presents with  . Left Ankle - Pain, Injury, Fracture  . New Patient (Initial Visit)    04-10-17 L ANKLE INJURY FELL OFF LADDER   42 year old gentleman fell about 30 feet off a ladder on 10 April 2017.  Complaining of left ankle and foot pain visit at [facility] emergency room.  Demonstrated a nondisplaced lateral malleolus fracture tarsal he was immobilized in equalizer boot and given crutches and asked to follow-up.  9 weeks post injury this is his first opportunity for follow-up doing well and not having much pain.  Not working.  Still on the crutches and using the equalizer boot  HPI  Review of Systems  Constitutional: Negative for fatigue and fever.  HENT: Negative for ear pain.   Eyes: Negative for pain.  Respiratory: Negative for cough and shortness of breath.   Cardiovascular: Positive for leg swelling.  Gastrointestinal: Negative for constipation and diarrhea.  Genitourinary: Negative for difficulty urinating.  Musculoskeletal: Positive for back pain. Negative for neck pain.  Skin: Negative for  rash.  Allergic/Immunologic: Negative for food allergies.  Neurological: Positive for weakness and numbness.  Psychiatric/Behavioral: Negative for sleep disturbance.     Objective: Vital Signs: BP 132/89 (BP Location: Left Arm, Patient Position: Sitting, Cuff Size: Normal)   Pulse 94   Resp 18   Ht 6' (1.829 m)   Wt 217 lb (98.4 kg)   BMI 29.43 kg/m   Physical Exam  Constitutional: He is oriented to person, place, and time. He appears well-developed and well-nourished.  HENT:  Mouth/Throat: Oropharynx is clear and moist.  Eyes: Pupils are equal, round, and reactive to light. EOM are normal.  Pulmonary/Chest: Effort normal.  Neurological: He is alert and oriented to person, place, and time.  Skin: Skin is warm and dry.  Psychiatric: He has a normal mood and affect. His behavior is normal.    Ortho Exam awake alert and oriented x3.  Comfortable sitting.  Mild edema of the dorsum of the left foot but no significant tenderness at the base of the second or third metatarsals.  Skin intact except for a large painful corn along the medial aspect of the great toe IP joint.  I actually pared this down with a 15 blade knife so that he did not have any further pain.  No pain along the lateral malleolus.  No obvious deformity.  Neurovascular exam intact.  Mild decreased range of motion of the ankle related to the  length of immobilization.  Specialty Comments:  No specialty comments available.  Imaging: Xr Ankle Complete Left  Result Date: 06/14/2017 Films of the left ankle were obtained in several projections.  Previously identified nondisplaced fracture of the lateral malleolus is identified.  No significant callus.  Ankle mortise intact.  No displacement of the fracture.    PMFS History: Patient Active Problem List   Diagnosis Date Noted  . Fibula fracture 05/31/2017  . Metatarsal fracture 05/31/2017  . History of basal cell carcinoma 04/05/2016  . Non-healing ulcer (HCC) 10/11/2015    . MDD (major depressive disorder), recurrent severe, without psychosis (HCC) 02/15/2015  . Cocaine use disorder, moderate, dependence (HCC) 09/17/2014  . Substance induced mood disorder (HCC) 09/17/2014  . Polysubstance abuse (HCC)   . Polysubstance dependence including opioid type drug, continuous use (HCC) 12/06/2013  . Hepatitis C antibody test positive 03/13/2013  . Elevated transaminase level 03/13/2013   Past Medical History:  Diagnosis Date  . Hepatitis C   . 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 No pertinent family history.  Past Surgical History:  Procedure Laterality Date  . HERNIA REPAIR     Social History   Occupational History  . Not on file  Tobacco Use  . Smoking status: Current Every Day Smoker    Packs/day: 0.50    Types: Cigarettes  . Smokeless tobacco: Never Used  Substance and Sexual Activity  . Alcohol use: No  . Drug use: Not Currently    Types: Marijuana, "Crack" cocaine, Methamphetamines    Comment: last time he used 04/2014  . Sexual activity: Not on file

## 2017-07-02 ENCOUNTER — Ambulatory Visit (INDEPENDENT_AMBULATORY_CARE_PROVIDER_SITE_OTHER): Payer: Self-pay | Admitting: Orthopaedic Surgery

## 2017-07-02 ENCOUNTER — Encounter (INDEPENDENT_AMBULATORY_CARE_PROVIDER_SITE_OTHER): Payer: Self-pay | Admitting: Orthopaedic Surgery

## 2017-07-02 VITALS — BP 131/92 | HR 80 | Ht 72.0 in | Wt 217.0 lb

## 2017-07-02 DIAGNOSIS — M79672 Pain in left foot: Secondary | ICD-10-CM

## 2017-07-02 NOTE — Progress Notes (Signed)
Office Visit Note   Patient: Victor Gomez           Date of Birth: Jan 09, 1976           MRN: [id number] Visit Date: 07/02/2017              Requested by: Hoy Register, MD [address] [place], [place] [address] PCP: Hoy Register, MD   Assessment & Plan: Visit Diagnoses:  1. Left foot pain     Plan: 25-month status post left foot injury involving a nondisplaced lateral malleolus fracture comminuted yet nondisplaced fracture of the base of the second metatarsal.  Has been using equalizer boot without any other ambulatory aid and doing very well.  Has been working on range of motion exercises.  No pain by exam today.  I think is fine to wean himself from the boot.  He like to start looking for work.  I will plan to see him back as needed.  No further x-rays necessary.  I think the fractures have healed.  He is warned that he will have some trouble for a while when he is up on his feet out of the boot  Follow-Up Instructions: Return if symptoms worsen or fail to improve.   Orders:  No orders of the defined types were placed in this encounter.  No orders of the defined types were placed in this encounter.     Procedures: No procedures performed   Clinical Data: No additional findings.   Subjective: Chief Complaint  Patient presents with  . Left Ankle - Pain  . Follow-up    LEFT ANKLE PAIN, STIFFNESS DOING FOOT EXERCISES  3 months status post injury to left foot falling off a ladder.  He had a nondisplaced fracture at the base of the second metatarsal with intra-articular extension and a nondisplaced fracture of the lateral malleolus same ankle.  Doing well in the equalizer boot over the past 2 weeks.  HPI  Review of Systems  Constitutional: Negative for fatigue and fever.  HENT: Negative for ear pain.   Eyes: Negative for pain.  Respiratory: Negative for cough and shortness of breath.   Cardiovascular: Negative for leg swelling.  Gastrointestinal: Negative  for constipation and diarrhea.  Genitourinary: Negative for difficulty urinating.  Musculoskeletal: Negative for back pain and neck pain.  Skin: Negative for rash.  Allergic/Immunologic: Negative for food allergies.  Neurological: Negative for weakness and numbness.  Hematological: Does not bruise/bleed easily.  Psychiatric/Behavioral: Negative for sleep disturbance.     Objective: Vital Signs: BP (!) 131/92 (BP Location: Right Arm, Patient Position: Sitting, Cuff Size: Normal)   Pulse 80   Ht 6' (1.829 m)   Wt 217 lb (98.4 kg)   BMI 29.43 kg/m   Physical Exam  Ortho Exam awake alert and oriented x3.  Comfortable sitting.  No shortness of breath or chest pain.  Pupils equal round reactive to light and accommodation.  Skin dry.  Replies appropriate.  Examination left ankle reveals no deformity.  No pain to this area and skin intact.  Has at least 20 degrees of dorsiflexion about 30 degrees of plantarflexion and no pain with subtalar motion.  No pain over the base of the second metatarsal.  Capillary refill to the toes.  Motor exam intact.  Specialty Comments:  No specialty comments available.  Imaging: No results found.   PMFS History: Patient Active Problem List   Diagnosis Date Noted  . Fibula fracture 05/31/2017  . Metatarsal fracture 05/31/2017  .  History of basal cell carcinoma 04/05/2016  . Non-healing ulcer (HCC) 10/11/2015  . MDD (major depressive disorder), recurrent severe, without psychosis (HCC) 02/15/2015  . Cocaine use disorder, moderate, dependence (HCC) 09/17/2014  . Substance induced mood disorder (HCC) 09/17/2014  . Polysubstance abuse (HCC)   . Polysubstance dependence including opioid type drug, continuous use (HCC) 12/06/2013  . Hepatitis C antibody test positive 03/13/2013  . Elevated transaminase level 03/13/2013   Past Medical History:  Diagnosis Date  . Hepatitis C   . 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 No pertinent  family history.  Past Surgical History:  Procedure Laterality Date  . HERNIA REPAIR     Social History   Occupational History  . Not on file  Tobacco Use  . Smoking status: Current Every Day Smoker    Packs/day: 0.50    Types: Cigarettes  . Smokeless tobacco: Never Used  Substance and Sexual Activity  . Alcohol use: No  . Drug use: Not Currently    Types: Marijuana, "Crack" cocaine, Methamphetamines    Comment: last time he used 04/2014  . Sexual activity: Not on file

## 2017-07-12 ENCOUNTER — Ambulatory Visit: Payer: Self-pay | Admitting: Family Medicine

## 2017-12-29 ENCOUNTER — Emergency Department (HOSPITAL_COMMUNITY)
Admission: EM | Admit: 2017-12-29 | Discharge: 2017-12-30 | Disposition: A | Discharge status: Home / Self Care | Payer: Medicaid Other | Attending: Emergency Medicine | Admitting: Emergency Medicine

## 2017-12-29 ENCOUNTER — Encounter (HOSPITAL_COMMUNITY): Payer: Self-pay | Admitting: Emergency Medicine

## 2017-12-29 ENCOUNTER — Other Ambulatory Visit: Payer: Self-pay

## 2017-12-29 ENCOUNTER — Emergency Department (HOSPITAL_COMMUNITY): Payer: Medicaid Other

## 2017-12-29 DIAGNOSIS — F1990 Other psychoactive substance use, unspecified, uncomplicated: Secondary | ICD-10-CM | POA: Insufficient documentation

## 2017-12-29 DIAGNOSIS — F17210 Nicotine dependence, cigarettes, uncomplicated: Secondary | ICD-10-CM | POA: Insufficient documentation

## 2017-12-29 DIAGNOSIS — Z79899 Other long term (current) drug therapy: Secondary | ICD-10-CM | POA: Insufficient documentation

## 2017-12-29 DIAGNOSIS — R0789 Other chest pain: Secondary | ICD-10-CM | POA: Insufficient documentation

## 2017-12-29 LAB — COMPREHENSIVE METABOLIC PANEL
ALBUMIN: 3.7 g/dL (ref 3.5–5.0)
ALT: 26 U/L (ref 0–44)
AST: 58 U/L — AB (ref 15–41)
Alkaline Phosphatase: 58 U/L (ref 38–126)
Anion gap: 9 (ref 5–15)
BUN: 14 mg/dL (ref 6–20)
CO2: 28 mmol/L (ref 22–32)
Calcium: 9.4 mg/dL (ref 8.9–10.3)
Chloride: 104 mmol/L (ref 98–111)
Creatinine, Ser: 0.78 mg/dL (ref 0.61–1.24)
GFR calc Af Amer: >60 mL/min (ref >60)
GFR calc non Af Amer: >60 mL/min (ref >60)
GLUCOSE: 111 mg/dL — AB (ref 70–99)
POTASSIUM: 3.9 mmol/L (ref 3.5–5.1)
Sodium: 141 mmol/L (ref 135–145)
TOTAL PROTEIN: 7.1 g/dL (ref 6.5–8.1)
Total Bilirubin: 0.9 mg/dL (ref 0.3–1.2)

## 2017-12-29 LAB — CBC
HCT: 47.3 % (ref 39.0–52.0)
HEMOGLOBIN: 15.1 g/dL (ref 13.0–17.0)
MCH: 29 pg (ref 26.0–34.0)
MCHC: 31.9 g/dL (ref 30.0–36.0)
MCV: 90.8 fL (ref 80.0–100.0)
NRBC: 0 % (ref 0.0–0.2)
PLATELETS: 254 10*3/uL (ref 150–400)
RBC: 5.21 MIL/uL (ref 4.22–5.81)
RDW: 13.2 % (ref 11.5–15.5)
WBC: 7.8 10*3/uL (ref 4.0–10.5)

## 2017-12-29 LAB — I-STAT TROPONIN, ED: Troponin i, poc: 0.01 ng/mL (ref 0.00–0.08)

## 2017-12-29 LAB — ETHANOL: Alcohol, Ethyl (B): <10 mg/dL (ref <10)

## 2017-12-29 LAB — LIPASE, BLOOD: LIPASE: 25 U/L (ref 11–51)

## 2017-12-29 NOTE — ED Provider Notes (Signed)
[facility] DEPT Provider Note   CSN: [id number] Arrival date & time: 12/29/17  2152     History   Chief Complaint Chief Complaint  Patient presents with  . Chest Pain    HPI Victor Gomez is a 42 y.o. male with history of polysubstance abuse including IV drugs is here for evaluation of chest pain.  He points to epigastric/low sternum area.  He had one episode of CP suddenly around 4:30 PM when he got home from work, he was sitting down when it began.  Described as a stabbing sensation.  It lasted for approximately 1 minute.  It radiated into his thoracic back.  No interventions for this.  Non exertional, non pleuritic.  Reports chronic phlegm and intermittent cough that he attributes to smoking, unchanged.  States 1 of his friends had chest pain and back pain once and he died so he was concerned.  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  This back pain has been unchanged lately.  No falls, saddle anesthesia, loss of bladder or rectal tone. Admits to being clean for about 2 years but relapsed 6 months ago.  He has been using meth and heroin.  Last time he used IV drugs was 4 days ago.  He has been taking over-the-counter remedies to help with withdrawal symptoms. Medicine is called "red maeng da kratom", he took 6 capsules today and states that's why he is sleepy today.    He denies fevers, chills, nausea, vomiting, lightheadedness, diarrhea, constipation, dysuria, hematuria.  Reports hematochezia for a long time but no melena, hematemesis. HPI  Past Medical History:  Diagnosis Date  . Hepatitis C   . 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 Fibula fracture 05/31/2017  . Metatarsal fracture 05/31/2017  . History of basal cell carcinoma 04/05/2016  . Non-healing ulcer ([place]) 10/11/2015  . MDD (major depressive  disorder), recurrent severe, without psychosis ([place]) 02/15/2015  . Cocaine use disorder, moderate, dependence ([place]) 09/17/2014  . Substance induced mood disorder ([place]) 09/17/2014  . Polysubstance abuse ([place])   . Polysubstance dependence including opioid type drug, continuous use ([place]) 12/06/2013  . Hepatitis C antibody test positive 03/13/2013  . Elevated transaminase level 03/13/2013    Past Surgical History:  Procedure Laterality Date  . HERNIA REPAIR          Home Medications    Prior to Admission medications   Medication Sig Start Date End Date Taking? Authorizing Provider  acetaminophen (TYLENOL 8 HOUR) 650 MG CR tablet Take 1 tablet (650 mg total) by mouth every 8 (eight) hours as needed. Patient not taking: Reported on 07/02/2017 04/10/17   Varney Biles, MD  acetaminophen-codeine (TYLENOL #3) 300-30 MG tablet Take 1 tablet by mouth every 12 (twelve) hours as needed for moderate pain. Patient not taking: Reported on 07/02/2017 05/31/17   Charlott Rakes, MD  ibuprofen (ADVIL,MOTRIN) 200 MG tablet Take 600 mg by mouth every 6 (six) hours as needed.     [provider]  methocarbamol (ROBAXIN) 500 MG tablet Take 1 tablet (500 mg total) by mouth 4 (four) times daily. Patient not taking: Reported on 07/02/2017 05/31/17   Charlott Rakes, MD    Family History History reviewed. No pertinent family history.  Social History Social History   Tobacco Use  .  Smoking status: Current Every Day Smoker    Packs/day: 0.50    Types: Cigarettes  . Smokeless tobacco: Never Used  Substance Use Topics  . Alcohol use: No  . Drug use: Not Currently    Types: Marijuana, "Crack" cocaine, Methamphetamines    Comment: last time he used 04/2014     Allergies   Patient has no known allergies.   Review of Systems Review of Systems  Respiratory: Positive for cough.   Cardiovascular: Positive for chest pain.  Gastrointestinal: Positive for blood in stool.  Musculoskeletal:  Positive for back pain.  All other systems reviewed and are negative.    Physical Exam Updated Vital Signs BP 107/65   Pulse 64   Resp 12   Ht 6' (1.829 m)   Wt 84.8 kg   SpO2 95%   BMI 25.36 kg/m   Physical Exam  Constitutional: He appears well-developed and well-nourished.  NAD. Non toxic.  Slightly drowsy.  HENT:  Head: Normocephalic and atraumatic.  Nose: Nose normal.  Moist mucous membranes.  Eyes: Conjunctivae, EOM and lids are normal.  Neck: Trachea normal and normal range of motion.  Trachea midline.   Cardiovascular: Normal rate, regular rhythm, S1 normal, S2 normal and normal heart sounds.  Pulses:      Radial pulses are 2+ on the right side, and 2+ on the left side.       Dorsalis pedis pulses are 2+ on the right side, and 2+ on the left side.  No LE edema or calf tenderness.  No murmur.  Pulmonary/Chest: Effort normal and breath sounds normal.  No anterior chest wall tenderness. CP not reproducible with AROM of upper extremities.  Abdominal: Soft. Bowel sounds are normal. There is no tenderness.  No epigastric tenderness. No distention.   Neurological: He is alert. GCS eye subscore is 4. GCS verbal subscore is 5. GCS motor subscore is 6.  Skin: Skin is warm and dry. Capillary refill takes less than 2 seconds.  No rash to chest wall. Non tender warts to right fingers. No osler nodes, splinter hemorrhages on hands   Psychiatric: His speech is normal and behavior is normal. Thought content normal. Cognition and memory are normal.     ED Treatments / Results  Labs (all labs ordered are listed, but only abnormal results are displayed) Labs Reviewed  COMPREHENSIVE METABOLIC PANEL - Abnormal; Notable for the following components:      Result Value   Glucose, Bld 111 (*)    AST 58 (*)    All other components within normal limits  SEDIMENTATION RATE - Abnormal; Notable for the following components:   Sed Rate 18 (*)    All other components within normal limits    CBC  LIPASE, BLOOD  ETHANOL  RAPID URINE DRUG SCREEN, HOSP PERFORMED  URINALYSIS, ROUTINE W REFLEX MICROSCOPIC  I-STAT TROPONIN, ED  I-STAT TROPONIN, ED    EKG EKG Interpretation  Date/Time:  Saturday December 29 2017 22:02:51 EST Ventricular Rate:  63 PR Interval:    QRS Duration: 89 QT Interval:  389 QTC Calculation: 399 R Axis:   84 Text Interpretation:  Sinus rhythm Baseline wander in lead(s) V1 No significant change since last tracing Confirmed by Addison Lank [phone]) on 12/30/2017 2:00:24 AM   Radiology Dg Chest 2 View  Result Date: 12/29/2017 CLINICAL DATA:  Sternal chest pain. EXAM: CHEST - 2 VIEW COMPARISON:  None FINDINGS: Heart and mediastinal contours are within normal limits. No focal opacities or effusions. No acute  bony abnormality. IMPRESSION: No active cardiopulmonary disease. Electronically Signed   By: Rolm Baptise M.D.   On: 12/29/2017 22:41    Procedures Procedures (including critical care time)  Medications Ordered in ED Medications - No data to display   Initial Impression / Assessment and Plan / ED Course  I have reviewed the triage vital signs and the nursing notes.  Pertinent labs & imaging results that were available during my care of the patient were reviewed by me and considered in my medical decision making (see chart for details).    Symptomatology sounds atypical however he has h/o IV drug use.  Considering MSK etiology vs GERD/PUD vs cardiac ischemia vs endocarditis.  Patient does not have fevers.  He has a chronic cough but no tachycardia, tachypnea, hypoxia and his lungs sound normal slight low suspicion for pneumonia.  His pain is localized, nonexertional, nonpleuritic to the epigastric/low sternum which could be from GERD/gastritis.  He does drink alcohol.  No vomiting, melena, hematemesis.  Doubt PE in this patient given his vital signs, PERC negative.  We will obtain screening labs, troponin, EKG, ESR and reassess.  0240: CXR,  EKG, troponin x 2 within normal limits.  CBC and BMP unremarkable.  PERC negative. Heart score < 3.  No arrhythmias while on monitor. Sed rate basically normal. Given symptoms, reassuring ED work up, low risk HEART score patient will be discharged with recommendation to follow up with PCP and cardiologist in regards to today's hospital visit. Outpatient resources [facility] given. Encouraged cessation or IVDU and other illicit drugs. ED return preacutions given.    Final Clinical Impressions(s) / ED Diagnoses   Final diagnoses:  Atypical chest pain  IV drug user    ED Discharge Orders    None       Kinnie Feil, PA-C 12/30/17 0243    Fatima Blank, MD 12/30/17 0930

## 2017-12-29 NOTE — ED Triage Notes (Signed)
Pt reports having sharp chest pain and lower back pain. Pt reports chest pain started today around 2000.

## 2017-12-30 LAB — I-STAT TROPONIN, ED: TROPONIN I, POC: 0.01 ng/mL (ref 0.00–0.08)

## 2017-12-30 LAB — SEDIMENTATION RATE: SED RATE: 18 mm/h — AB (ref 0–16)

## 2017-12-30 NOTE — Discharge Instructions (Signed)
You were evaluated in the emergency department for chest pain.    Based on your risk factors, work up and exam you are considered low risk for major adverse cardiac events in the next 30 days.  This means you can be discharged with close follow up with cardiology for further outpatient work up.   Call cardiology as soon as possible to establish care and further discussion and work up of your symptoms on an outpatient setting  Stop using drugs, especially IV drugs because this puts you at risk for infection traveling into your heart. Please return to ED if: Your chest pain is worse. You have a cough that gets worse, or you cough up blood. You have severe pain in your abdomen. You have severe weakness. You faint. You have sudden, unexplained chest discomfort with exertion or activity or exercise  You have sudden, chest pain with discomfort in your arms, back, neck, or jaw. You have shortness of breath with exertion or activity  You suddenly start to sweat, or your skin gets clammy. You feel nauseous or you vomit. You suddenly feel light-headed or dizzy. Your heart begins to beat quickly, or it feels like it is skipping beats. You develop painful nodules on your finger tips or toes You develop black or brown spots or lines on your fingernails

## 2018-02-17 ENCOUNTER — Encounter (HOSPITAL_COMMUNITY): Payer: Self-pay

## 2018-02-17 ENCOUNTER — Emergency Department (HOSPITAL_COMMUNITY)
Admission: EM | Admit: 2018-02-17 | Discharge: 2018-02-18 | Disposition: A | Discharge status: Home / Self Care | Payer: Medicaid Other | Attending: Emergency Medicine | Admitting: Emergency Medicine

## 2018-02-17 ENCOUNTER — Encounter (HOSPITAL_COMMUNITY): Payer: Self-pay | Admitting: Emergency Medicine

## 2018-02-17 ENCOUNTER — Emergency Department (HOSPITAL_COMMUNITY)
Admission: EM | Admit: 2018-02-17 | Discharge: 2018-02-17 | Disposition: A | Discharge status: Home / Self Care | Payer: Medicaid Other | Attending: Emergency Medicine | Admitting: Emergency Medicine

## 2018-02-17 ENCOUNTER — Other Ambulatory Visit: Payer: Self-pay

## 2018-02-17 DIAGNOSIS — Z5900 Homelessness unspecified: Secondary | ICD-10-CM

## 2018-02-17 DIAGNOSIS — F1910 Other psychoactive substance abuse, uncomplicated: Secondary | ICD-10-CM

## 2018-02-17 DIAGNOSIS — R441 Visual hallucinations: Secondary | ICD-10-CM | POA: Insufficient documentation

## 2018-02-17 DIAGNOSIS — G4700 Insomnia, unspecified: Secondary | ICD-10-CM

## 2018-02-17 DIAGNOSIS — F22 Delusional disorders: Secondary | ICD-10-CM | POA: Insufficient documentation

## 2018-02-17 DIAGNOSIS — F17210 Nicotine dependence, cigarettes, uncomplicated: Secondary | ICD-10-CM | POA: Insufficient documentation

## 2018-02-17 DIAGNOSIS — Z85828 Personal history of other malignant neoplasm of skin: Secondary | ICD-10-CM | POA: Insufficient documentation

## 2018-02-17 DIAGNOSIS — Z046 Encounter for general psychiatric examination, requested by authority: Secondary | ICD-10-CM | POA: Insufficient documentation

## 2018-02-17 DIAGNOSIS — F329 Major depressive disorder, single episode, unspecified: Secondary | ICD-10-CM | POA: Insufficient documentation

## 2018-02-17 DIAGNOSIS — R45851 Suicidal ideations: Secondary | ICD-10-CM | POA: Insufficient documentation

## 2018-02-17 LAB — COMPREHENSIVE METABOLIC PANEL
ALT: 40 U/L (ref 0–44)
ANION GAP: 12 (ref 5–15)
AST: 53 U/L — ABNORMAL HIGH (ref 15–41)
Albumin: 4.2 g/dL (ref 3.5–5.0)
Alkaline Phosphatase: 68 U/L (ref 38–126)
BUN: 11 mg/dL (ref 6–20)
CO2: 21 mmol/L — ABNORMAL LOW (ref 22–32)
Calcium: 9.3 mg/dL (ref 8.9–10.3)
Chloride: 104 mmol/L (ref 98–111)
Creatinine, Ser: 0.57 mg/dL — ABNORMAL LOW (ref 0.61–1.24)
GFR calc Af Amer: >60 mL/min (ref >60)
GFR calc non Af Amer: >60 mL/min (ref >60)
Glucose, Bld: 92 mg/dL (ref 70–99)
Potassium: 3.4 mmol/L — ABNORMAL LOW (ref 3.5–5.1)
Sodium: 137 mmol/L (ref 135–145)
Total Bilirubin: 0.6 mg/dL (ref 0.3–1.2)
Total Protein: 7.7 g/dL (ref 6.5–8.1)

## 2018-02-17 LAB — CBC
HCT: 47.9 % (ref 39.0–52.0)
Hemoglobin: 15.7 g/dL (ref 13.0–17.0)
MCH: 30.2 pg (ref 26.0–34.0)
MCHC: 32.8 g/dL (ref 30.0–36.0)
MCV: 92.1 fL (ref 80.0–100.0)
Platelets: 297 10*3/uL (ref 150–400)
RBC: 5.2 MIL/uL (ref 4.22–5.81)
RDW: 13.2 % (ref 11.5–15.5)
WBC: 10.5 10*3/uL (ref 4.0–10.5)
nRBC: 0 % (ref 0.0–0.2)

## 2018-02-17 LAB — RAPID URINE DRUG SCREEN, HOSP PERFORMED
Amphetamines: POSITIVE — AB
Barbiturates: NOT DETECTED
Benzodiazepines: NOT DETECTED
Cocaine: POSITIVE — AB
Opiates: POSITIVE — AB
Tetrahydrocannabinol: NOT DETECTED

## 2018-02-17 LAB — ETHANOL: Alcohol, Ethyl (B): 29 mg/dL — ABNORMAL HIGH (ref <10)

## 2018-02-17 LAB — ACETAMINOPHEN LEVEL

## 2018-02-17 LAB — SALICYLATE LEVEL: Salicylate Lvl: <7 mg/dL (ref 2.8–30.0)
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
## 2018-02-17 NOTE — ED Notes (Signed)
Pt given d/c instructions, verbalized understanding. 

## 2018-02-17 NOTE — ED Notes (Signed)
[employer] states pt is voluntary.

## 2018-02-17 NOTE — ED Notes (Signed)
Bed: [facility] Expected date:  Expected time:  Means of arrival:  Comments: 

## 2018-02-17 NOTE — ED Provider Notes (Signed)
[facility]-EMERGENCY DEPT Provider Note   CSN: [id number] Arrival date & time: 02/17/18  0741     History   Chief Complaint Chief Complaint  Patient presents with  . Delusional    HPI Victor Gomez is a 43 y.o. male.  The history is provided by the patient and medical records. No language interpreter was used.   Victor Gomez is a 43 y.o. male who presents to the Emergency Department complaining of tired. Resents to the emergency department for evaluation of inability to sleep. He states that he recently use math and then he has been having difficulty sleeping. He did try heroin to help him sleep but has had trouble finding a place to lay down. He denies any SI, HI, chest pain, abdominal pain, nausea, vomiting. He is currently homeless and is unsure what shelters are available. He has no medical problems and takes no prescription medications. Symptoms are mild to moderate, constant nature. He is requesting a medication to help him with his "nerves". Past Medical History:  Diagnosis Date  . Hepatitis C   . 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 Fibula fracture 05/31/2017  . Metatarsal fracture 05/31/2017  . History of basal cell carcinoma 04/05/2016  . Non-healing ulcer (HCC) 10/11/2015  . MDD (major depressive disorder), recurrent severe, without psychosis (HCC) 02/15/2015  . Cocaine use disorder, moderate, dependence (HCC) 09/17/2014  . Substance induced mood disorder (HCC) 09/17/2014  . Polysubstance abuse (HCC)   . Polysubstance dependence including opioid type drug, continuous use (HCC) 12/06/2013  . Hepatitis C antibody test positive 03/13/2013  . Elevated transaminase level 03/13/2013    Past Surgical History:  Procedure Laterality Date  . HERNIA REPAIR          Home Medications    Prior to Admission medications   Not on File    Family History No family history on  file.  Social History Social History   Tobacco Use  . Smoking status: Current Every Day Smoker    Packs/day: 0.50    Types: Cigarettes  . Smokeless tobacco: Never Used  Substance Use Topics  . Alcohol use: No  . Drug use: Not Currently    Types: Marijuana, "Crack" cocaine, Methamphetamines    Comment: last time he used 04/2014     Allergies   Patient has no known allergies.   Review of Systems Review of Systems  All other systems reviewed and are negative.    Physical Exam Updated Vital Signs BP 124/81 (BP Location: Left Arm)   Pulse 81   Temp 98 F (36.7 C) (Oral)   Resp 20   SpO2 99%   Physical Exam Vitals signs and nursing note reviewed.  Constitutional:      Appearance: He is well-developed.  HENT:     Head: Normocephalic and atraumatic.  Cardiovascular:     Rate and Rhythm: Normal rate and regular rhythm.     Heart sounds: No murmur.  Pulmonary:     Effort: Pulmonary effort is normal. No respiratory distress.     Breath sounds: Normal breath sounds.  Abdominal:     Palpations: Abdomen is soft.     Tenderness: There is no abdominal tenderness. There is no guarding or rebound.  Musculoskeletal:        General: No tenderness.  Skin:    General: Skin is warm and dry.  Capillary Refill: Capillary refill takes less than 2 seconds.  Neurological:     Mental Status: He is alert and oriented to person, place, and time.  Psychiatric:        Mood and Affect: Mood normal.        Behavior: Behavior normal.      ED Treatments / Results  Labs (all labs ordered are listed, but only abnormal results are displayed) Labs Reviewed - No data to display  EKG None  Radiology No results found.  Procedures Procedures (including critical care time)  Medications Ordered in ED Medications - No data to display   Initial Impression / Assessment and Plan / ED Course  I have reviewed the triage vital signs and the nursing notes.  Pertinent labs & imaging  results that were available during my care of the patient were reviewed by me and considered in my medical decision making (see chart for details).     Pt with history of polysubstance abuse here for inability to sleep after using methamphetamine. He is alert and appropriate on examination with no evidence of acute psychosis, SI, HI. He has no somatic complaints outside of fatigue and wanting to sleep. Discussed with patient resources for shelter as well as rehabilitation for his substance abuse. Plan to DC with outpatient follow-up and return precautions.  Final Clinical Impressions(s) / ED Diagnoses   Final diagnoses:  Polysubstance abuse [facility])  Homelessness    ED Discharge Orders    None       Tilden Fossa, MD 02/17/18 1039

## 2018-02-17 NOTE — ED Triage Notes (Signed)
States just discharged today from [facility] and now [employer] brining him in for seeing things that aren't there and states he is paranoid.

## 2018-02-17 NOTE — Progress Notes (Signed)
Received Victor Gomez from the main ED, alert with the smell of alcohol. He stated there are people trying to hurt him and at the rooming house. He endorsed feeling depressed, suicidal and hopelessness.   He stated he does not live at the rooming house anymore, he lives on the streets. He endorsed drinking, taking heroine and ice before coming to the hospital. He was oriented to his new environment, received a meal and a shower. He slept throughout the night.  Plan - he received his discharge order and the AVS was placed in the chart.

## 2018-02-17 NOTE — ED Triage Notes (Addendum)
Pt states he is delusional since he has been without sleep x 5 days and abusing meth and heroin. Pt calm and cooperative. He states he is homeless and has been at [employer] and no one is paying attention to him. Pt states he got frustrated with being lonely so he decided to come to ED.

## 2018-02-18 NOTE — ED Notes (Signed)
Pt is alert and oriented. RN gave pt his discharge instructions and a bus pass and was instructed to leave the property. Pt instructed to follow up with [facility].

## 2018-02-19 ENCOUNTER — Encounter (HOSPITAL_COMMUNITY): Payer: Self-pay | Admitting: *Deleted

## 2018-02-19 ENCOUNTER — Emergency Department (HOSPITAL_COMMUNITY)
Admission: EM | Admit: 2018-02-19 | Discharge: 2018-02-19 | Disposition: A | Discharge status: Home / Self Care | Payer: Medicaid Other | Attending: Emergency Medicine | Admitting: Emergency Medicine

## 2018-02-19 DIAGNOSIS — F1010 Alcohol abuse, uncomplicated: Secondary | ICD-10-CM | POA: Insufficient documentation

## 2018-02-19 DIAGNOSIS — Z79899 Other long term (current) drug therapy: Secondary | ICD-10-CM | POA: Insufficient documentation

## 2018-02-19 DIAGNOSIS — F329 Major depressive disorder, single episode, unspecified: Secondary | ICD-10-CM | POA: Insufficient documentation

## 2018-02-19 DIAGNOSIS — F17210 Nicotine dependence, cigarettes, uncomplicated: Secondary | ICD-10-CM | POA: Insufficient documentation

## 2018-02-19 DIAGNOSIS — F1910 Other psychoactive substance abuse, uncomplicated: Secondary | ICD-10-CM | POA: Insufficient documentation

## 2018-02-19 LAB — ETHANOL: Alcohol, Ethyl (B): <10 mg/dL (ref <10)

## 2018-02-19 LAB — COMPREHENSIVE METABOLIC PANEL
ALT: 30 U/L (ref 0–44)
AST: 31 U/L (ref 15–41)
Albumin: 4 g/dL (ref 3.5–5.0)
Alkaline Phosphatase: 64 U/L (ref 38–126)
Anion gap: 7 (ref 5–15)
BUN: 14 mg/dL (ref 6–20)
CO2: 27 mmol/L (ref 22–32)
Calcium: 9.1 mg/dL (ref 8.9–10.3)
Chloride: 102 mmol/L (ref 98–111)
Creatinine, Ser: 0.76 mg/dL (ref 0.61–1.24)
GFR calc Af Amer: >60 mL/min (ref >60)
Glucose, Bld: 90 mg/dL (ref 70–99)
Potassium: 4.1 mmol/L (ref 3.5–5.1)
Sodium: 136 mmol/L (ref 135–145)
Total Bilirubin: 0.4 mg/dL (ref 0.3–1.2)
Total Protein: 7.3 g/dL (ref 6.5–8.1)

## 2018-02-19 LAB — RAPID URINE DRUG SCREEN, HOSP PERFORMED
Amphetamines: POSITIVE — AB
Barbiturates: NOT DETECTED
Benzodiazepines: NOT DETECTED
Cocaine: NOT DETECTED
Opiates: POSITIVE — AB
Tetrahydrocannabinol: NOT DETECTED

## 2018-02-19 LAB — CBC WITH DIFFERENTIAL/PLATELET
Abs Immature Granulocytes: 0.02 10*3/uL (ref 0.00–0.07)
Basophils Absolute: 0 10*3/uL (ref 0.0–0.1)
Basophils Relative: 0 %
EOS ABS: 0.2 10*3/uL (ref 0.0–0.5)
Eosinophils Relative: 3 %
HCT: 50 % (ref 39.0–52.0)
Hemoglobin: 15.9 g/dL (ref 13.0–17.0)
Immature Granulocytes: 0 %
Lymphocytes Relative: 33 %
Lymphs Abs: 2.4 10*3/uL (ref 0.7–4.0)
MCH: 30.2 pg (ref 26.0–34.0)
MCHC: 31.8 g/dL (ref 30.0–36.0)
MCV: 94.9 fL (ref 80.0–100.0)
Monocytes Absolute: 0.7 10*3/uL (ref 0.1–1.0)
Monocytes Relative: 9 %
Neutro Abs: 4 10*3/uL (ref 1.7–7.7)
Neutrophils Relative %: 55 %
Platelets: 327 10*3/uL (ref 150–400)
RBC: 5.27 MIL/uL (ref 4.22–5.81)
RDW: 13.3 % (ref 11.5–15.5)
WBC: 7.3 10*3/uL (ref 4.0–10.5)
nRBC: 0 % (ref 0.0–0.2)

## 2018-02-19 MED ORDER — ALUM & MAG HYDROXIDE-SIMETH 200-200-20 MG/5ML PO SUSP: 30.0000 mL | Freq: Once | ORAL | Status: DC

## 2018-02-19 MED ORDER — LIDOCAINE VISCOUS HCL 2 % MT SOLN: 15.0000 mL | Freq: Once | OROMUCOSAL | Status: DC

## 2018-02-19 NOTE — Discharge Instructions (Signed)
Follow up with [facility].  Your medical screening labs today were unremarkable.  You are medically cleared for treatment. Return to ED for worsening symptoms, seizures, tremors, increased vomiting, chest pain or shortness of breath.

## 2018-02-19 NOTE — ED Triage Notes (Signed)
Pt states he needs medical clearance to get into [facility] for detox. Pt wants detox from opiates and alcohol. Pt last drank alcohol yesterday, last used heroin yesterday. Pt denies suicidal. Pt reports feeling hopeless because he cannot get treatment.

## 2018-02-19 NOTE — ED Notes (Signed)
Bed: [facility] Expected date:  Expected time:  Means of arrival:  Comments: 

## 2018-02-19 NOTE — ED Provider Notes (Signed)
[facility]-EMERGENCY DEPT Provider Note   CSN: [id number] Arrival date & time: 02/19/18  1856     History   Chief Complaint Chief Complaint  Patient presents with  . Medical Clearance    HPI Victor Gomez is a 43 y.o. male with a past medical history of polysubstance abuse, alcohol abuse, depression who presents to ED for medical clearance.  He has been trying to get admission to a detox facility for detox from his alcohol and heroin use.  States that last use of each was yesterday.  He does note weaning himself off of alcohol and is currently down to one 40 a day which is significantly improved from drinking about 4 a day.  He reports having generalized abdominal cramping secondary to opiate withdrawal.  Denies any seizures, tremors, hallucinations, SI, HI, pain elsewhere.  HPI  Past Medical History:  Diagnosis Date  . Hepatitis C   . 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 Fibula fracture 05/31/2017  . Metatarsal fracture 05/31/2017  . History of basal cell carcinoma 04/05/2016  . Non-healing ulcer (HCC) 10/11/2015  . MDD (major depressive disorder), recurrent severe, without psychosis (HCC) 02/15/2015  . Cocaine use disorder, moderate, dependence (HCC) 09/17/2014  . Substance induced mood disorder (HCC) 09/17/2014  . Polysubstance abuse (HCC)   . Polysubstance dependence including opioid type drug, continuous use (HCC) 12/06/2013  . Hepatitis C antibody test positive 03/13/2013  . Elevated transaminase level 03/13/2013    Past Surgical History:  Procedure Laterality Date  . HERNIA REPAIR          Home Medications    Prior to Admission medications   Not on File    Family History No family history on file.  Social History Social History   Tobacco Use  . Smoking status: Current Every Day Smoker    Packs/day: 0.50    Types: Cigarettes  . Smokeless tobacco: Never Used    Substance Use Topics  . Alcohol use: No  . Drug use: Not Currently    Types: Marijuana, "Crack" cocaine, Methamphetamines    Comment: last time he used 04/2014     Allergies   Patient has no known allergies.   Review of Systems Review of Systems  Constitutional: Negative for appetite change, chills and fever.  HENT: Negative for ear pain, rhinorrhea, sneezing and sore throat.   Eyes: Negative for photophobia and visual disturbance.  Respiratory: Negative for cough, chest tightness, shortness of breath and wheezing.   Cardiovascular: Negative for chest pain and palpitations.  Gastrointestinal: Negative for abdominal pain, blood in stool, constipation, diarrhea, nausea and vomiting.  Genitourinary: Negative for dysuria, hematuria and urgency.  Musculoskeletal: Negative for myalgias.  Skin: Negative for rash.  Neurological: Negative for dizziness, weakness and light-headedness.     Physical Exam Updated Vital Signs BP 137/80   Pulse 83   Temp 97.7 F (36.5 C) (Oral)   Resp 18   SpO2 100%   Physical Exam Vitals signs and nursing note reviewed.  Constitutional:      General: He is not in acute distress.    Appearance: He is well-developed.  HENT:     Head: Normocephalic and atraumatic.     Nose: Nose normal.  Eyes:     General: No scleral icterus.       Right eye: No discharge.        Left eye: No  discharge.     Conjunctiva/sclera: Conjunctivae normal.  Neck:     Musculoskeletal: Normal range of motion and neck supple.  Cardiovascular:     Rate and Rhythm: Normal rate and regular rhythm.     Heart sounds: Normal heart sounds. No murmur. No friction rub. No gallop.   Pulmonary:     Effort: Pulmonary effort is normal. No respiratory distress.     Breath sounds: Normal breath sounds.  Abdominal:     General: Bowel sounds are normal. There is no distension.     Palpations: Abdomen is soft.     Tenderness: There is no abdominal tenderness. There is no guarding.   Musculoskeletal: Normal range of motion.  Skin:    General: Skin is warm and dry.     Findings: No rash.  Neurological:     Mental Status: He is alert.     Motor: No abnormal muscle tone.     Coordination: Coordination normal.      ED Treatments / Results  Labs (all labs ordered are listed, but only abnormal results are displayed) Labs Reviewed  RAPID URINE DRUG SCREEN, HOSP PERFORMED - Abnormal; Notable for the following components:      Result Value   Opiates POSITIVE (*)    Amphetamines POSITIVE (*)    All other components within normal limits  COMPREHENSIVE METABOLIC PANEL  CBC WITH DIFFERENTIAL/PLATELET  ETHANOL    EKG None  Radiology No results found.  Procedures Procedures (including critical care time)  Medications Ordered in ED Medications  alum & mag hydroxide-simeth (MAALOX/MYLANTA) 200-200-20 MG/5ML suspension 30 mL (has no administration in time range)    And  lidocaine (XYLOCAINE) 2 % viscous mouth solution 15 mL (has no administration in time range)     Initial Impression / Assessment and Plan / ED Course  I have reviewed the triage vital signs and the nursing notes.  Pertinent labs & imaging results that were available during my care of the patient were reviewed by me and considered in my medical decision making (see chart for details).     43 year old male with a past medical history of polysubstance abuse, alcohol abuse presents to ED for medical clearance.  He is trying to get into [facility] for detox.  He was sent here for medical clearance lab work.  Last use of alcohol and heroin was yesterday.  He has decreased his alcohol use to one 40 a day over the past several weeks.  He denies any tremors, seizures, vomiting.  He does note some abdominal discomfort secondary to his opiate withdrawal.  On exam he is overall well-appearing.  Abdomen is soft, nontender nondistended.  Vital signs are within normal limits.  Medical screening lab work is  unremarkable.  UDS is positive for opiates and amphetamines.  He denies any SI, HI or AVH.  I do not feel that TTS evaluation is warranted at this time.  He is not currently going through alcohol withdrawal.  Advised patient to follow-up with [facility].  Patient is hemodynamically stable, in NAD, and able to ambulate in the ED. Evaluation does not show pathology that would require ongoing emergent intervention or inpatient treatment. I explained the diagnosis to the patient. Pain has been managed and has no complaints prior to discharge. Patient is comfortable with above plan and is stable for discharge at this time. All questions were answered prior to disposition. Strict return precautions for returning to the ED were discussed. Encouraged follow up with PCP.  Portions of this note were generated with Scientist, clinical (histocompatibility and immunogenetics)Dragon dictation software. Dictation errors may occur despite best attempts at proofreading.   Final Clinical Impressions(s) / ED Diagnoses   Final diagnoses:  Polysubstance abuse [facility](HCC)    ED Discharge Orders    None       Dietrich PatesKhatri, Niana Martorana, PA-C 02/19/18 2141    Tegeler, Canary Brimhristopher J, MD 02/19/18 [phone]

## 2018-02-20 ENCOUNTER — Ambulatory Visit (HOSPITAL_COMMUNITY): Payer: Self-pay

## 2018-02-20 NOTE — Patient Outreach (Signed)
CPSS met with the patient to provide substance use recovery support and help with substance use treatment resources. Patient was looking to get help for his opioid drug use.  CPSS Aaron Edelman DeGraphenreid was able to help the patient get accepted to [facility] detox program in [place], [place] for his opioid use. An [facility] staff member will assist with transportation to [facility]'s treatment center at 9:15pm 02/20/18. CPSS provided the patient with CPSS contact information as well to further assist the patient with getting connected to other substance use treatment resources outside of the [facility] if needed.

## 2018-09-07 ENCOUNTER — Other Ambulatory Visit: Payer: Self-pay

## 2018-09-07 ENCOUNTER — Emergency Department (HOSPITAL_COMMUNITY)
Admission: EM | Admit: 2018-09-07 | Discharge: 2018-09-07 | Disposition: A | Discharge status: Home / Self Care | Payer: Medicaid Other | Attending: Emergency Medicine | Admitting: Emergency Medicine

## 2018-09-07 DIAGNOSIS — R109 Unspecified abdominal pain: Secondary | ICD-10-CM

## 2018-09-07 DIAGNOSIS — B1710 Acute hepatitis C without hepatic coma: Secondary | ICD-10-CM | POA: Insufficient documentation

## 2018-09-07 DIAGNOSIS — F17210 Nicotine dependence, cigarettes, uncomplicated: Secondary | ICD-10-CM | POA: Insufficient documentation

## 2018-09-07 DIAGNOSIS — R1032 Left lower quadrant pain: Secondary | ICD-10-CM | POA: Insufficient documentation

## 2018-09-07 MED ORDER — CYCLOBENZAPRINE HCL 10 MG PO TABS: 10.0000 mg | ORAL_TABLET | Freq: Two times a day (BID) | ORAL | 0 refills | Status: DC | PRN

## 2018-09-07 NOTE — Discharge Instructions (Signed)
The pain you had today may have been caused by a strained muscle or a kidney stone.  If the pain returns you can try the muscle relaxer as well as Tylenol and ibuprofen.  If the pain is continuing and not getting better you can follow-up with your doctor or return to the emergency room for further care

## 2018-09-07 NOTE — ED Triage Notes (Signed)
C/O left flank pain started yesrterday; reported was doing yard work the other day and might have pulled a muscle.

## 2018-09-07 NOTE — ED Provider Notes (Addendum)
[facility] EMERGENCY DEPARTMENT Provider Note   CSN: [id number] Arrival date & time: 09/07/18  0212     History   Chief Complaint Chief Complaint  Patient presents with  . Flank Pain    HPI Victor Gomez is a 43 y.o. male.     Patient is a 43 year old male with a history of methadone use, polysubstance abuse, hepatitis C who is presenting today with left flank pain.  Patient states he was doing a lot of yard work yesterday and it started hurting towards the end of the day.  He states by the middle of last night the pain was severe and intolerable.  He was unable to sleep and was very uncomfortable.  Specific ways that he would twist and move would seem to make the pain worse.  It did not radiate he did not have any urinary symptoms and he denied any abdominal pain.  He had no nausea or vomiting.  He had otherwise been feeling normal the last few days and denies fever or URI symptoms.  He had no numbness or weakness in his lower extremities.  Patient states that he waited in the waiting room since 4 AM and went to sleep on the bench and in the last 2 hours the pain has gone away.  He now states he feels normal.  The history is provided by the patient.  Flank Pain This is a new problem. The current episode started 6 to 12 hours ago. The problem occurs constantly. The problem has been resolved. Pertinent negatives include no chest pain, no abdominal pain and no shortness of breath. Associated symptoms comments: No dysuria, frequency or urgency. The symptoms are aggravated by bending and twisting. Nothing relieves the symptoms. He has tried nothing for the symptoms.    Past Medical History:  Diagnosis Date  . Hepatitis C   . 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 Fibula fracture 05/31/2017  . Metatarsal fracture 05/31/2017  . History of basal cell carcinoma 04/05/2016  . Non-healing ulcer ([place]) 10/11/2015   . MDD (major depressive disorder), recurrent severe, without psychosis ([place]) 02/15/2015  . Cocaine use disorder, moderate, dependence ([place]) 09/17/2014  . Substance induced mood disorder ([place]) 09/17/2014  . Polysubstance abuse ([place])   . Polysubstance dependence including opioid type drug, continuous use ([place]) 12/06/2013  . Hepatitis C antibody test positive 03/13/2013  . Elevated transaminase level 03/13/2013    Past Surgical History:  Procedure Laterality Date  . HERNIA REPAIR          Home Medications    Prior to Admission medications   Medication Sig Start Date End Date Taking? Authorizing Provider  cyclobenzaprine (FLEXERIL) 10 MG tablet Take 1 tablet (10 mg total) by mouth 2 (two) times daily as needed for muscle spasms. 09/07/18   Blanchie Dessert, MD    Family History No family history on file.  Social History Social History   Tobacco Use  . Smoking status: Current Every Day Smoker    Packs/day: 0.50    Types: Cigarettes  . Smokeless tobacco: Never Used  Substance Use Topics  . Alcohol use: No  . Drug use: Not Currently    Types: Marijuana, "Crack" cocaine, Methamphetamines    Comment: last time he used 04/2014     Allergies   Patient has no known allergies.   Review of Systems Review of Systems  Respiratory: Negative for  shortness of breath.   Cardiovascular: Negative for chest pain.  Gastrointestinal: Negative for abdominal pain.  Genitourinary: Positive for flank pain.     Physical Exam Updated Vital Signs BP 120/88 (BP Location: Left Arm)   Pulse 84   Temp (!) 97.5 F (36.4 C) (Oral)   Resp 16   Ht 6' (1.829 m)   Wt 97.5 kg   SpO2 99%   BMI 29.16 kg/m   Physical Exam Vitals signs and nursing note reviewed.  Constitutional:      General: He is not in acute distress.    Appearance: He is well-developed.  HENT:     Head: Normocephalic and atraumatic.  Eyes:     Conjunctiva/sclera: Conjunctivae normal.     Pupils: Pupils are equal,  round, and reactive to light.  Neck:     Musculoskeletal: Normal range of motion and neck supple.  Cardiovascular:     Rate and Rhythm: Normal rate and regular rhythm.     Pulses: Normal pulses.     Heart sounds: No murmur.  Pulmonary:     Effort: Pulmonary effort is normal. No respiratory distress.     Breath sounds: Normal breath sounds. No wheezing or rales.  Abdominal:     General: There is no distension.     Palpations: Abdomen is soft.     Tenderness: There is no abdominal tenderness. There is no right CVA tenderness, left CVA tenderness, guarding or rebound.  Musculoskeletal: Normal range of motion.        General: No tenderness.     Comments: No reproducible back pain at this time with twisting or palpation in the lumbar or thoracic area  Skin:    General: Skin is warm and dry.     Findings: No erythema or rash.  Neurological:     Mental Status: He is alert and oriented to person, place, and time.  Psychiatric:        Behavior: Behavior normal.      ED Treatments / Results  Labs (all labs ordered are listed, but only abnormal results are displayed) Labs Reviewed - No data to display  EKG None  Radiology No results found.  Procedures Procedures (including critical care time)  Medications Ordered in ED Medications - No data to display   Initial Impression / Assessment and Plan / ED Course  I have reviewed the triage vital signs and the nursing notes.  Pertinent labs & imaging results that were available during my care of the patient were reviewed by me and considered in my medical decision making (see chart for details).        Patient presenting today with acute onset of flank pain yesterday.  Patient now reports the pain is resolved.  Possible musculoskeletal pain as he was doing a lot of yard work yesterday but also could have been a kidney stone that has now passed.  Patient is anxious to leave the emergency room because he needs to go pick up his  daughter.  He has a normal exam at this time and no reproducible pain.  There are no neurologic complaints or findings on exam concerning for spinal pathology and he denies any infectious symptoms concerning for UTI or other underlying etiology.  Patient encouraged to return if symptoms return but was also given a prescription for muscle relaxer to use as needed if the pain comes back.  Final Clinical Impressions(s) / ED Diagnoses   Final diagnoses:  Acute left flank pain  ED Discharge Orders         Ordered    cyclobenzaprine (FLEXERIL) 10 MG tablet  2 times daily PRN     09/07/18 [id number]           Gwyneth SproutPlunkett, Torrie Namba, MD 09/07/18 [id number]    Gwyneth SproutPlunkett, Nagi Furio, MD 09/14/18 1827

## 2019-05-12 ENCOUNTER — Ambulatory Visit: Payer: Self-pay | Attending: Internal Medicine

## 2019-05-12 DIAGNOSIS — Z23 Encounter for immunization: Secondary | ICD-10-CM

## 2019-05-12 NOTE — Progress Notes (Signed)
   Covid-19 Vaccination Clinic  Name:  MCCADE SULLENBERGER    MRN: [id number] DOB: 1975/04/20  05/12/2019  Mr. Wishart was observed post Covid-19 immunization for 15 minutes without incident. He was provided with Vaccine Information Sheet and instruction to access the V-Safe system.   Mr. Divelbiss was instructed to call 911 with any severe reactions post vaccine: Marland Kitchen Difficulty breathing  . Swelling of face and throat  . A fast heartbeat  . A bad rash all over body  . Dizziness and weakness   Immunizations Administered    Name Date Dose VIS Date Route   Pfizer COVID-19 Vaccine 05/12/2019  3:55 PM 0.3 mL 01/24/2019 Intramuscular   Manufacturer: [employer], [employer]   Lot: [id number]   NDC: [address]-8

## 2019-06-09 ENCOUNTER — Ambulatory Visit: Payer: Medicaid Other | Attending: Internal Medicine

## 2019-06-09 DIAGNOSIS — Z23 Encounter for immunization: Secondary | ICD-10-CM

## 2019-06-09 NOTE — Progress Notes (Signed)
   Covid-19 Vaccination Clinic  Name:  AGASTYA MEISTER    MRN: [id number] DOB: 1975/12/30  06/09/2019  Mr. Hilbun was observed post Covid-19 immunization for 15 minutes without incident. He was provided with Vaccine Information Sheet and instruction to access the V-Safe system.   Mr. Marszalek was instructed to call 911 with any severe reactions post vaccine: Marland Kitchen Difficulty breathing  . Swelling of face and throat  . A fast heartbeat  . A bad rash all over body  . Dizziness and weakness   Immunizations Administered    Name Date Dose VIS Date Route   Pfizer COVID-19 Vaccine 06/09/2019  4:56 PM 0.3 mL 04/09/2018 Intramuscular   Manufacturer: [employer], [employer]   Lot: [id number]   NDC: [address]-8

## 2019-12-25 IMAGING — CR DG CHEST 2V
2 series · 2 of 2 positions shown · non-contrast
Comparison: None

CLINICAL DATA: Sternal chest pain.

EXAM:
CHEST - 2 VIEW

[w chest pa]
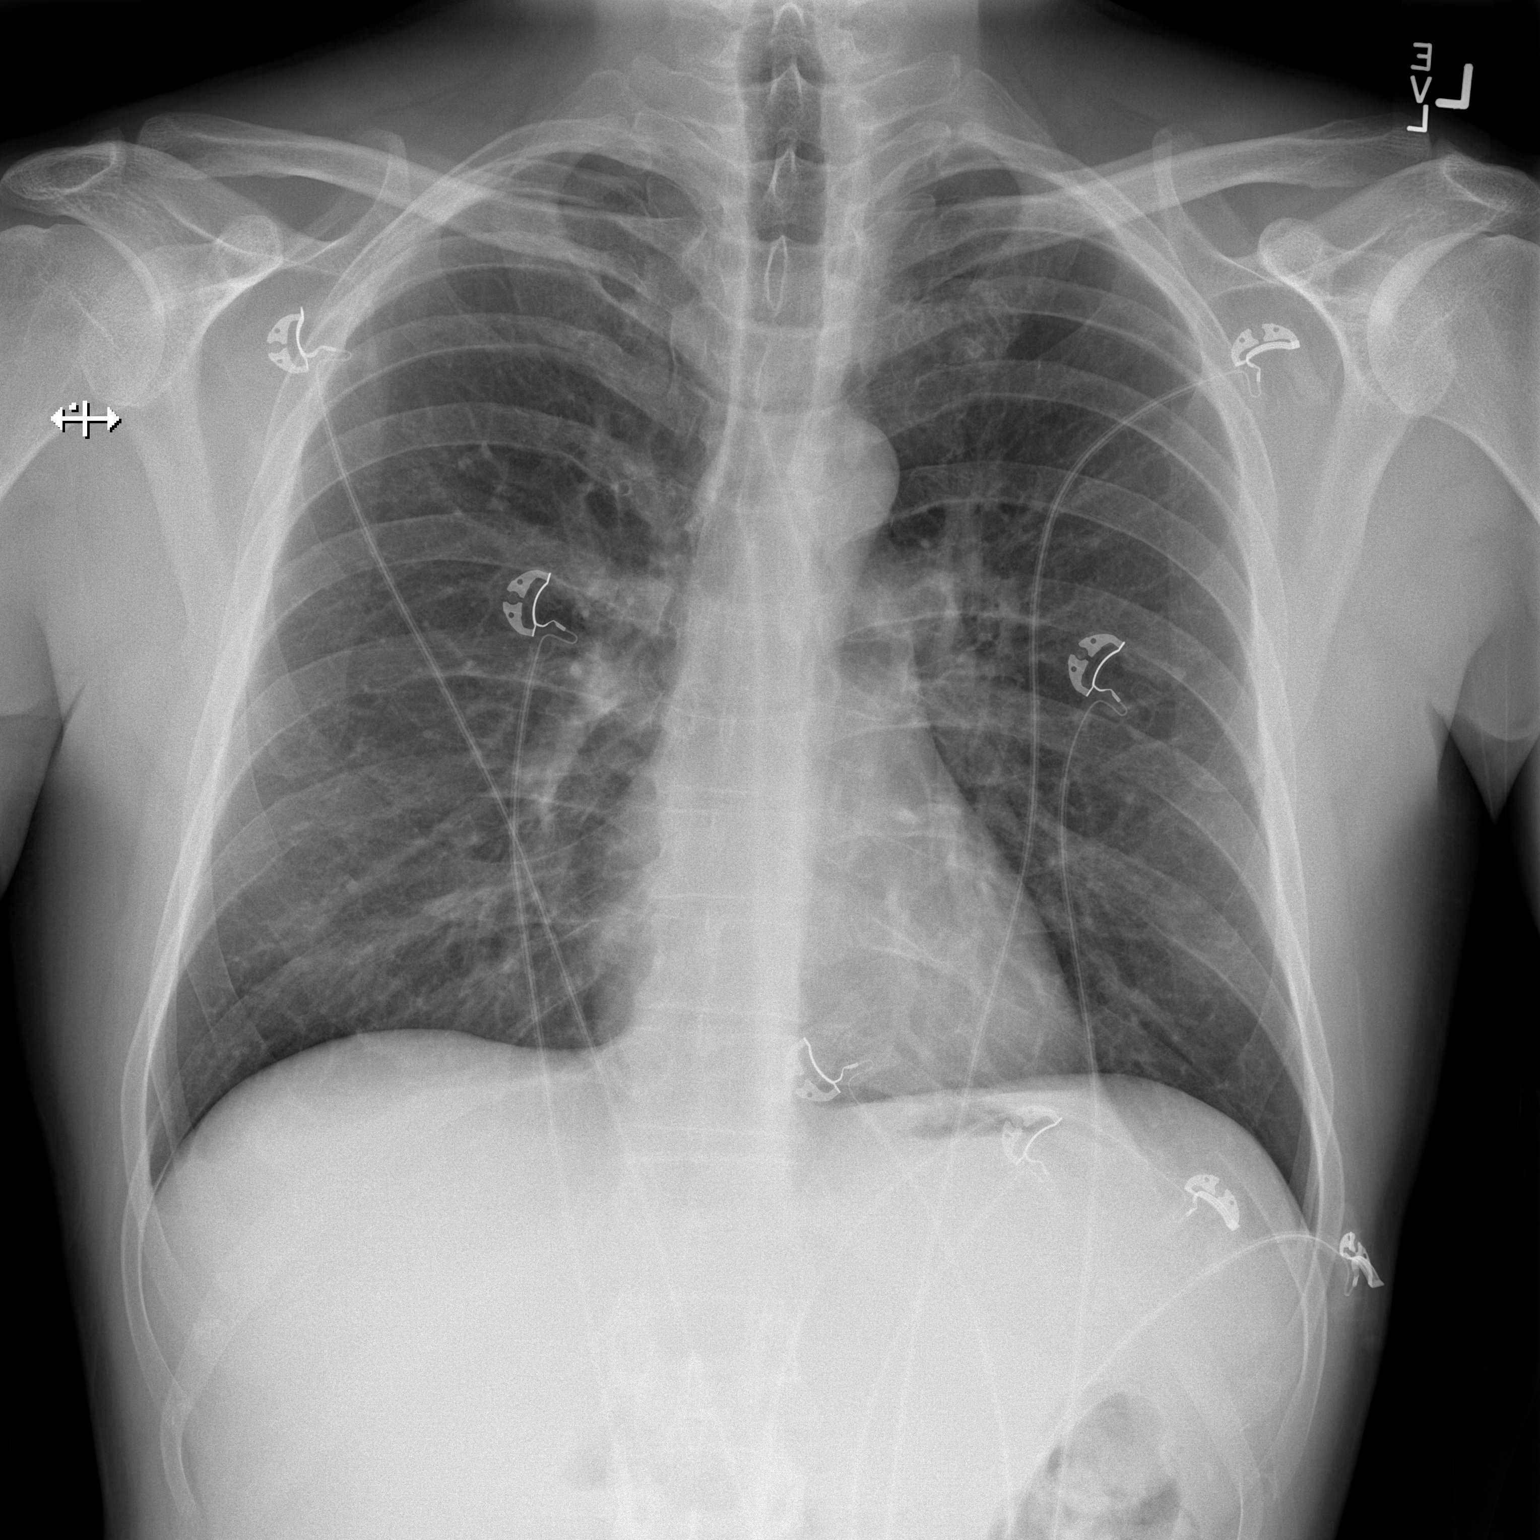

[w chest lat]
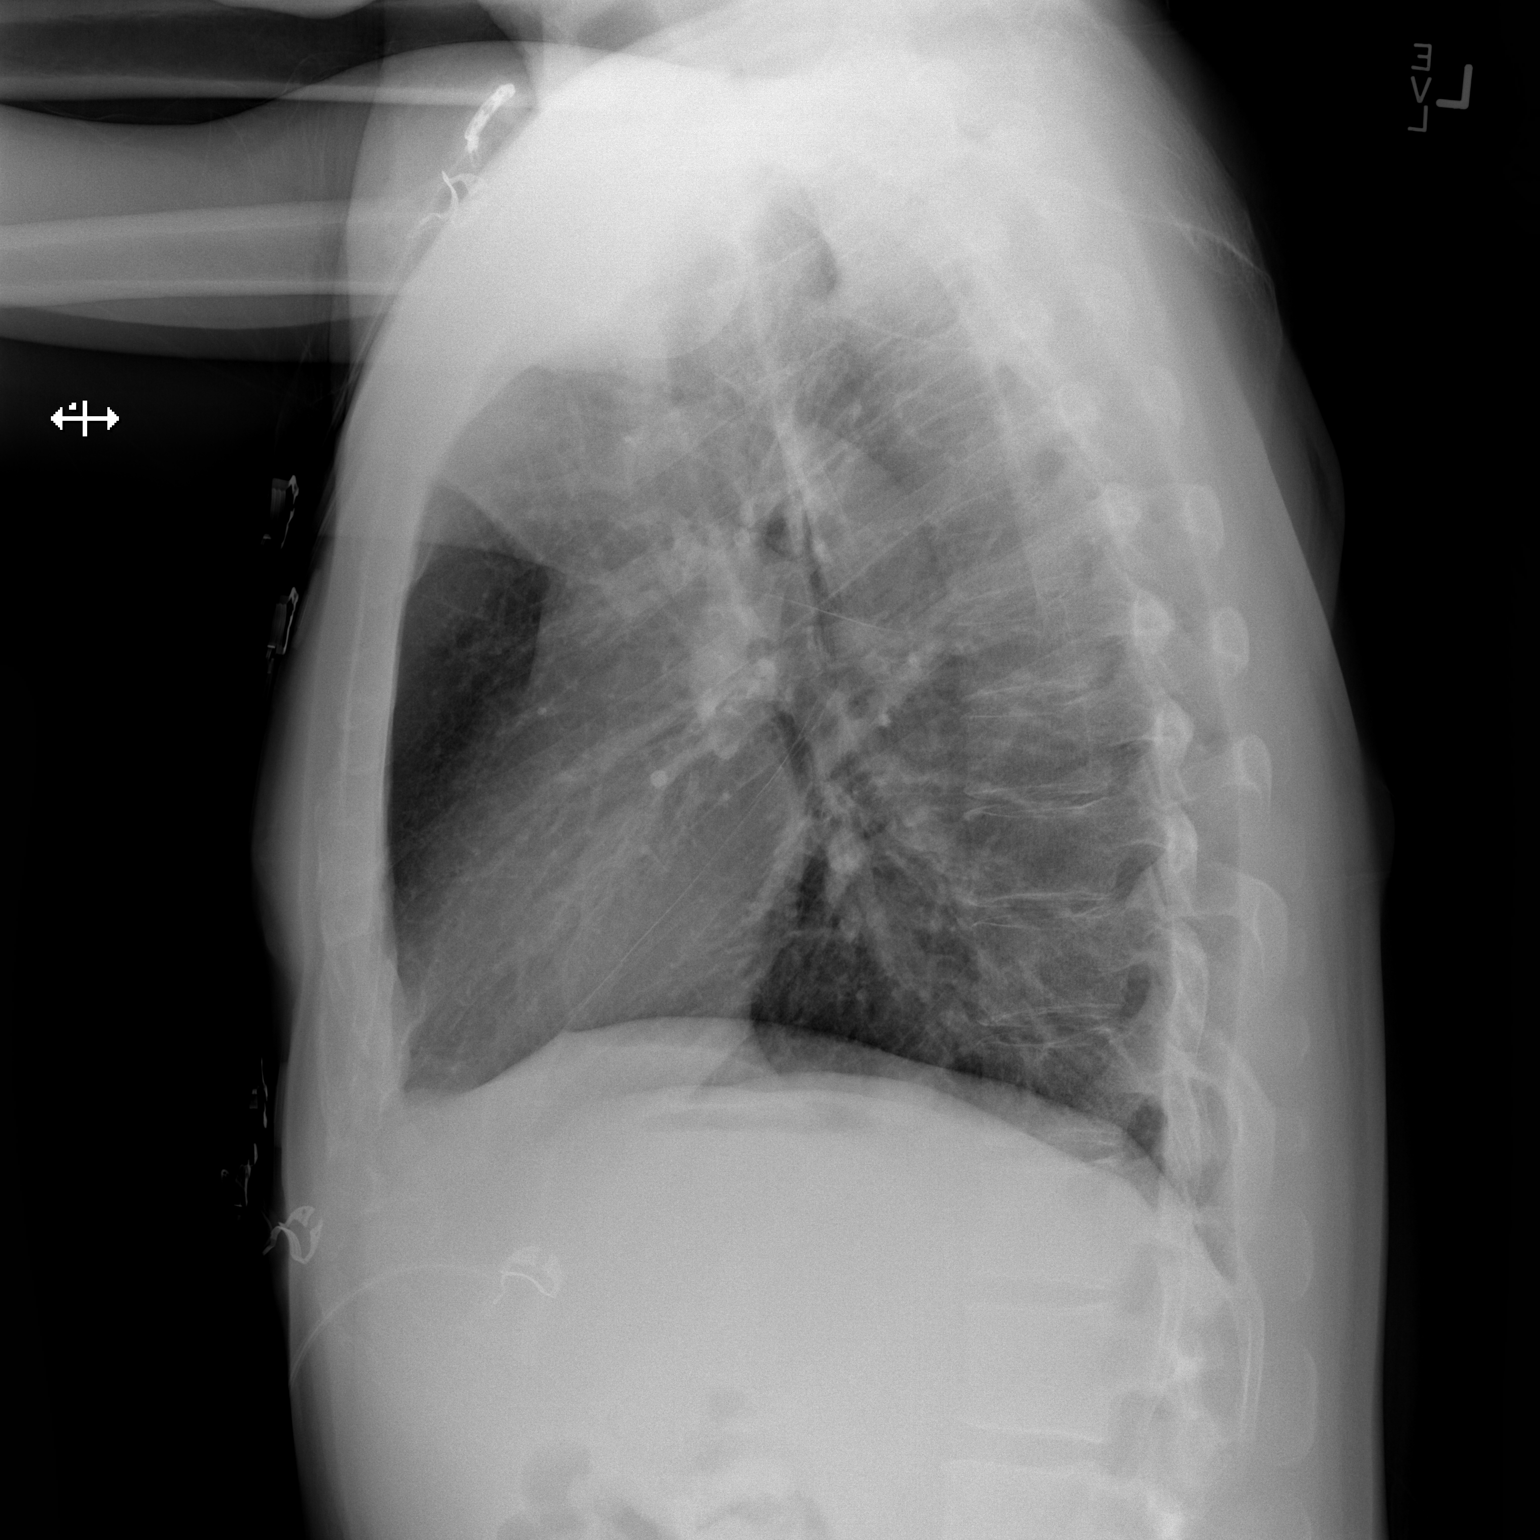

[2 of 2 positions shown; findings below may reference images not displayed]

FINDINGS: Heart and mediastinal contours are within normal limits. No focal
opacities or effusions. No acute bony abnormality.
IMPRESSION: No active cardiopulmonary disease.

## 2020-01-22 ENCOUNTER — Other Ambulatory Visit: Payer: Self-pay

## 2020-01-22 ENCOUNTER — Ambulatory Visit: Payer: Self-pay | Admitting: Student

## 2020-01-22 ENCOUNTER — Encounter: Payer: Self-pay | Admitting: Student

## 2020-01-22 ENCOUNTER — Ambulatory Visit (HOSPITAL_COMMUNITY)
Admission: RE | Admit: 2020-01-22 | Discharge: 2020-01-22 | Disposition: A | Discharge status: Home / Self Care | Payer: Medicaid Other | Source: Ambulatory Visit | Attending: Internal Medicine | Admitting: Internal Medicine

## 2020-01-22 VITALS — BP 138/80 | HR 92 | Temp 98.0°F | Ht 72.0 in | Wt 248.3 lb

## 2020-01-22 DIAGNOSIS — Z720 Tobacco use: Secondary | ICD-10-CM

## 2020-01-22 DIAGNOSIS — F1910 Other psychoactive substance abuse, uncomplicated: Secondary | ICD-10-CM | POA: Insufficient documentation

## 2020-01-22 DIAGNOSIS — E782 Mixed hyperlipidemia: Secondary | ICD-10-CM

## 2020-01-22 DIAGNOSIS — F1120 Opioid dependence, uncomplicated: Secondary | ICD-10-CM

## 2020-01-22 DIAGNOSIS — B182 Chronic viral hepatitis C: Secondary | ICD-10-CM

## 2020-01-22 DIAGNOSIS — F1920 Other psychoactive substance dependence, uncomplicated: Secondary | ICD-10-CM

## 2020-01-22 DIAGNOSIS — E785 Hyperlipidemia, unspecified: Secondary | ICD-10-CM | POA: Insufficient documentation

## 2020-01-22 DIAGNOSIS — G4700 Insomnia, unspecified: Secondary | ICD-10-CM

## 2020-01-22 DIAGNOSIS — F17200 Nicotine dependence, unspecified, uncomplicated: Secondary | ICD-10-CM | POA: Insufficient documentation

## 2020-01-22 DIAGNOSIS — R768 Other specified abnormal immunological findings in serum: Secondary | ICD-10-CM

## 2020-01-22 LAB — POCT GLYCOSYLATED HEMOGLOBIN (HGB A1C): Hemoglobin A1C: 5.6 % (ref 4.0–5.6)

## 2020-01-22 LAB — GLUCOSE, CAPILLARY: Glucose-Capillary: 114 mg/dL — ABNORMAL HIGH (ref 70–99)

## 2020-01-22 MED ORDER — TRAZODONE HCL 100 MG PO TABS: 100.0000 mg | ORAL_TABLET | Freq: Every day | ORAL | 2 refills | Status: DC

## 2020-01-22 MED ORDER — NICOTINE POLACRILEX 2 MG MT GUM: 2.0000 mg | CHEWING_GUM | OROMUCOSAL | 0 refills | Status: DC | PRN

## 2020-01-22 NOTE — Assessment & Plan Note (Addendum)
Patient is supposed to be on atorvastatin 20 mg but has not been taking it.  Also reports sedentary lifestyle with eating ice cream frequently.  He states that he is trying to cut back on sweets.  Plan -Obtain lipid panel today.   -Will calculate ASCVD and start medication accordingly  Addendum: ASCVD score 17.3 - 8.6%. Will start Atorvastatin 40 mg daily. Called and spoke to patient's mother. She will encourage him to have healthier diet choice.

## 2020-01-22 NOTE — Assessment & Plan Note (Signed)
Patient reports smoking 1 pack a day.  He is motivated to quit.  He asked nicotine patch in the past and states that it gave him nightmares when used at night.  Patient requested nicotine gums. -Nicotine gum -Encourage smoking cessation

## 2020-01-22 NOTE — Patient Instructions (Addendum)
Mr. Keisling,  It is a pleasure seeing you today in the clinic.  Here is a summary of what we talked about:  1.  Please continue to follow-up with the methadone clinic.  We obtained an EKG in the office which was normal and did not show any QT prolongation.  2.  Hepatitis C: I sent out a referral to an infectious disease office.  Please follow-up with them and hopefully they will offer an assistant program for medication cost.  3.  High cholesterol: I will obtain a lipid profile today.  I will call you for the results and start medication accordingly.  Please work on cutting back on sweets such as ice cream.  3.  Insomnia: I will refill trazodone 100 mg.  Please take 1 pill at bedtime.  Take care  Dr. Cyndie Chime

## 2020-01-22 NOTE — Progress Notes (Signed)
   CC: Establish care and management of hepatitis C  HPI:  [name] is a 44 y.o. with past medical history of hepatitis C, polysubstance use disorder, who presents to clinic for establishing care.  Patient have history of polysubstance use and is following a methadone clinic in [place].  He is currently on 125 mg methadone daily.  Patient was also seen by [facility] in [place] starting in 2019.  However he could not go back to [place] into February due to missing classes and taking a benzo.  Patient is motivated to change and is looking for job at [employer].  Patient is smoking 1 pack a day and is working on quitting.  Has tried nicotine patch in the past but it gave him nightmares when used at night.  Patient request nicotine gums.    Patient brought his medication with him which include atorvastatin 20 mg and vitamin D3, which he has not been taking.  He also reports taking trazodone at bedtime for sleep but has ran out.    He also had a positive hepatitis C antibody in 2014.  States that he went to a specialist but was told that his medication was not covered by the orange card.  Social history Living with his mother Denies alcohol use Smoke 1 pack a day Denies recreational drug use  Family history Father: Paranoid schizophrenia Mother: Hypothyroidism  Surgical history Basal cell carcinoma removed of the temporal area  Allergy No known drug allergy  Past Medical History:  Diagnosis Date  . Hepatitis C   . Methadone use    clinic each day   Review of Systems:  Review of Systems  Constitutional: Negative.   Eyes: Negative.   Respiratory: Negative for shortness of breath.   Cardiovascular: Negative.   Psychiatric/Behavioral: Negative for substance abuse. The patient has insomnia.     Physical Exam:  Vitals:   01/22/20 0923  BP: 138/80  Pulse: 92  Temp: 98 F (36.7 C)  TempSrc: Oral  SpO2: 96%  Weight: 248 lb 5.4 oz (112.6 kg)  Height: 6' (1.829 m)   Physical  Exam Constitutional:      General: He is not in acute distress. HENT:     Head: Normocephalic.  Eyes:     General:        Right eye: No discharge.        Left eye: No discharge.  Cardiovascular:     Rate and Rhythm: Normal rate and regular rhythm.  Pulmonary:     Effort: No respiratory distress.     Breath sounds: Normal breath sounds.  Abdominal:     General: Bowel sounds are normal.  Musculoskeletal:     Cervical back: Normal range of motion.  Skin:    General: Skin is warm.  Neurological:     Mental Status: He is alert.  Psychiatric:        Mood and Affect: Mood normal.      Assessment & Plan:   See Encounters Tab for problem based charting.  Patient seen with Dr. Oswaldo Done

## 2020-01-22 NOTE — Assessment & Plan Note (Addendum)
Patient has history of polysubstance use in the past.  He is currently following a methadone clinic at [facility].  States that he is taking methadone 125 mg daily.  States that he has not had an EKG done in about 2 years.  Patient was also going to [facility] in [place] and is seeing Dr. Ethelene Browns, who had been prescribing his medications.  He currently cannot go back to [facility] until February due to missing classes and taking a dose of benzo.  Assessment and plan Obtain EKG today which show normal sinus rhythm and normal QTC.  Encourage patient to continue following the methadone clinic.  His mom is also applying to [place] family service for counseling. -Following methadone clinic -Refill trazodone 100 mg at bedtime for insomnia

## 2020-01-23 LAB — LIPID PANEL
Chol/HDL Ratio: 8.9 ratio — ABNORMAL HIGH (ref 0.0–5.0)
Cholesterol, Total: 310 mg/dL — ABNORMAL HIGH (ref 100–199)
HDL: 35 mg/dL — ABNORMAL LOW (ref >39)
LDL Chol Calc (NIH): 169 mg/dL — ABNORMAL HIGH (ref 0–99)
Triglycerides: 528 mg/dL — ABNORMAL HIGH (ref 0–149)
VLDL Cholesterol Cal: 106 mg/dL — ABNORMAL HIGH (ref 5–40)

## 2020-01-23 MED ORDER — ATORVASTATIN CALCIUM 40 MG PO TABS: 40.0000 mg | ORAL_TABLET | Freq: Every day | ORAL | 11 refills | Status: DC

## 2020-01-23 NOTE — Progress Notes (Signed)
Internal Medicine Clinic Attending  I saw and evaluated the patient.  I personally confirmed the key portions of the history and exam documented by Dr. Nguyen and I reviewed pertinent patient test results.  The assessment, diagnosis, and plan were formulated together and I agree with the documentation in the resident's note.\  

## 2020-01-23 NOTE — Addendum Note (Signed)
Addended byDoran Stabler on: 01/23/2020 12:20 PM   Modules accepted: Orders

## 2020-01-29 ENCOUNTER — Other Ambulatory Visit: Payer: Self-pay

## 2020-01-29 ENCOUNTER — Ambulatory Visit (INDEPENDENT_AMBULATORY_CARE_PROVIDER_SITE_OTHER): Payer: Self-pay | Admitting: Internal Medicine

## 2020-01-29 ENCOUNTER — Encounter: Payer: Self-pay | Admitting: Internal Medicine

## 2020-01-29 VITALS — BP 129/84 | HR 82 | Temp 97.8°F | Wt 254.0 lb

## 2020-01-29 DIAGNOSIS — B182 Chronic viral hepatitis C: Secondary | ICD-10-CM

## 2020-01-29 NOTE — Patient Instructions (Signed)
Thank you for using our service  We'll get blood tests for you today. It is unclear if you have active hepatitis c   Once the test is available we can redicus next next week

## 2020-01-29 NOTE — Progress Notes (Signed)
[facility] for Infectious Disease  Patient Active Problem List   Diagnosis Date Noted  . Tobacco use disorder 01/22/2020  . Hyperlipidemia 01/22/2020  . Fibula fracture 05/31/2017  . Metatarsal fracture 05/31/2017  . History of basal cell carcinoma 04/05/2016  . Non-healing ulcer ([place]) 10/11/2015  . MDD (major depressive disorder), recurrent severe, without psychosis ([place]) 02/15/2015  . Cocaine use disorder, moderate, dependence ([place]) 09/17/2014  . Substance induced mood disorder ([place]) 09/17/2014  . Polysubstance abuse ([place])   . Polysubstance dependence including opioid type drug, continuous use ([place]) 12/06/2013  . Hepatitis C antibody test positive 03/13/2013  . Elevated transaminase level 03/13/2013      Subjective:    Patient ID: Victor Gomez, male    DOB: 01/11/1976, 44 y.o.   MRN: [id number]  Chief Complaint  Patient presents with  . Follow-up    Hep C    HPI:  Victor Gomez is a 44 y.o. male referred by pcp for chronic hep c  I reviewed note from pcp and chart He only has a hep c Ab test that was positive in 2014  Risk:  Hx ivdu; quit 2 years ago as of 01/2020 Hx intranasal cocaine too  Told about hep c test 7 years ago but never follow up No hx ascites, increased abd girth, black/bloody stool, vomiting up blood, muscle wasting, confusion Broke left foot so has flares of left knee/foot pain No rash No weight loss No fatigue  We discussed natural history of hep c, transmission, complication We discussed good treatment options We discussed other conditions that may make his liver health worse such as dm/obesity/etoh  #social -no etoh; quit ivdu 2019 -smoke cigarette -unemployed; does undertable work -patient currently on methadone replacement -drives himself there -lives with mother in [place], [place]   No Known Allergies    Outpatient Medications Prior to Visit  Medication Sig Dispense Refill  . atorvastatin (LIPITOR) 40 MG  tablet Take 1 tablet (40 mg total) by mouth daily. 30 tablet 11  . Cod Liver Oil OIL Take by mouth.    . D3 SUPER STRENGTH 50 MCG (2000 UT) CAPS Take 1 capsule by mouth daily.    . Melatonin 10 MG CAPS Take by mouth.    . methadone (DOLOPHINE) 10 MG tablet Take 10 mg by mouth daily.    . Omega-3 Fatty Acids (FISH OIL PO) Take by mouth.    . traZODone (DESYREL) 100 MG tablet Take 1 tablet (100 mg total) by mouth at bedtime. 30 tablet 2  . nicotine polacrilex (NICORETTE) 2 MG gum Take 1 each (2 mg total) by mouth as needed for smoking cessation. (Patient not taking: Reported on 01/29/2020) 100 tablet 0   No facility-administered medications prior to visit.     Past Medical History:  Diagnosis Date  . Hepatitis C   . Methadone use    clinic each day      Past Surgical History:  Procedure Laterality Date  . HERNIA REPAIR        No family history on file.    Social History   Socioeconomic History  . Marital status: Legally Separated    Spouse name: Not on file  . Number of children: Not on file  . Years of education: Not on file  . Highest education level: Not on file  Occupational History  . Not on file  Tobacco Use  . Smoking status: Current Every Day Smoker  Packs/day: 0.50    Types: Cigarettes  . Smokeless tobacco: Never Used  Vaping Use  . Vaping Use: Never used  Substance and Sexual Activity  . Alcohol use: No  . Drug use: Not Currently    Types: Marijuana, "Crack" cocaine, Methamphetamines    Comment: last time he used 04/2014  . Sexual activity: Not on file  Other Topics Concern  . Not on file  Social History Narrative  . Not on file   Social Determinants of Health   Financial Resource Strain: Not on file  Food Insecurity: Not on file  Transportation Needs: Not on file  Physical Activity: Not on file  Stress: Not on file  Social Connections: Not on file  Intimate Partner Violence: Not on file      Review of Systems    Negative 11 point  ros unless mentioned above  Objective:    BP 129/84   Pulse 82   Temp 97.8 F (36.6 C) (Oral)   Wt 254 lb (115.2 kg)   BMI 34.45 kg/m  Nursing note and vital signs reviewed.  Physical Exam    obese, pleasant, no distress Heent: normocephalic; per; conj clear Neck supple cv rrr no mrg Lungs clear; normal respiratory effort abd s/nt Ext no edema Skin no rash Neuro cn2-12 intact; normal strength & reflex Psych alert/oriented msk no synovitis   Labs: 2014 hep c ab positive  Micro:  Serology:  Imaging:  Assessment & Plan:   Patient Active Problem List   Diagnosis Date Noted  . Tobacco use disorder 01/22/2020  . Hyperlipidemia 01/22/2020  . Fibula fracture 05/31/2017  . Metatarsal fracture 05/31/2017  . History of basal cell carcinoma 04/05/2016  . Non-healing ulcer ([place]) 10/11/2015  . MDD (major depressive disorder), recurrent severe, without psychosis ([place]) 02/15/2015  . Cocaine use disorder, moderate, dependence ([place]) 09/17/2014  . Substance induced mood disorder ([place]) 09/17/2014  . Polysubstance abuse ([place])   . Polysubstance dependence including opioid type drug, continuous use ([place]) 12/06/2013  . Hepatitis C antibody test positive 03/13/2013  . Elevated transaminase level 03/13/2013     Problem List Items Addressed This Visit   None   Visit Diagnoses    Chronic hepatitis C without hepatic coma (HCC)    -  Primary   Relevant Orders   HIV Antibody (routine testing w rflx)   Hepatitis A Ab, Total   Hepatitis B Core Antibody, total   Hepatitis B surface antibody,quantitative   Hepatitis B Surface AntiGEN   Hepatitis C Antibody   Hepatitis C genotype   Hepatitis C RNA quantitative   CBC w/Diff   Comp Met (CMET)   Protime-INR ( [facility] ONLY)      #question of active chronic hep c Patient with risk factors but unclear if he has active disease or has cleared it. Will get labs as above mentioned to determine this  Patient will follow up in 1 week  to discuss result  If evidence active hep c which will presume to be chronic we will discuss further pretreatment workup and approach   I am having Victor Gomez maintain his traZODone, nicotine polacrilex, atorvastatin, D3 Super Strength, Omega-3 Fatty Acids (FISH OIL PO), Melatonin, Cod Liver Oil, and methadone.   No orders of the defined types were placed in this encounter.    Follow-up: No follow-ups on file.      Jabier Mutton, [facility] -- -- pager   [phone] cell  01/29/2020, 10:36 AM

## 2020-02-05 ENCOUNTER — Ambulatory Visit (INDEPENDENT_AMBULATORY_CARE_PROVIDER_SITE_OTHER): Payer: Self-pay | Admitting: Internal Medicine

## 2020-02-05 ENCOUNTER — Telehealth: Payer: Self-pay

## 2020-02-05 ENCOUNTER — Other Ambulatory Visit: Payer: Self-pay

## 2020-02-05 ENCOUNTER — Encounter: Payer: Self-pay | Admitting: Internal Medicine

## 2020-02-05 VITALS — BP 142/88 | HR 77 | Temp 98.0°F | Wt 251.0 lb

## 2020-02-05 DIAGNOSIS — B182 Chronic viral hepatitis C: Secondary | ICD-10-CM

## 2020-02-05 NOTE — Telephone Encounter (Signed)
[facility] Patient Advocate Encounter  Completed and sent MYABBVIE application for myavyret for this patient who is uninsured.    Patient assistance phone number for follow up is [phone].   This encounter will be updated until final determination.   Clearance Coots, CPhT Specialty Pharmacy Patient [facility] for Infectious Disease Phone: [phone] Fax:  [phone]

## 2020-02-05 NOTE — Patient Instructions (Addendum)
You actually have chronic hepatis c  We'll need to get a liver u/s with special procedure done to evaluate fully your liver health  Once this is done, we can start treatment.    Please follow up with [place] in 4 weeks to review ultrasound result and treatment setup   Avoid: Alcohol (congratulation of quitting) Tylenol more than 6 pills a day Raw seafood Sharing personal hygiene equipment  You can get hep c again after treatment so avoid risk of getting reinfection

## 2020-02-05 NOTE — Progress Notes (Addendum)
[facility]  Patient Active Problem List   Diagnosis Date Noted  . Tobacco use disorder 01/22/2020  . Hyperlipidemia 01/22/2020  . Fibula fracture 05/31/2017  . Metatarsal fracture 05/31/2017  . History of basal cell carcinoma 04/05/2016  . Non-healing ulcer (HCC) 10/11/2015  . MDD (major depressive disorder), recurrent severe, without psychosis (HCC) 02/15/2015  . Cocaine use disorder, moderate, dependence (HCC) 09/17/2014  . Substance induced mood disorder (HCC) 09/17/2014  . Polysubstance abuse (HCC)   . Polysubstance dependence including opioid type drug, continuous use (HCC) 12/06/2013  . Hepatitis C antibody test positive 03/13/2013  . Elevated transaminase level 03/13/2013      Subjective:    Patient ID: Victor Gomez, male    DOB: 1975-10-17, 44 y.o.   MRN: [id number]  Chief Complaint  Patient presents with  . Follow-up    Hep C    HPI:  Victor Gomez is a 44 y.o. male referred by pcp for chronic hep c  12/23 id f/u Reviewed labs; serology confirmed chronic hep c Patient without questions today  Background: ------------ I reviewed note from pcp and chart He only has a hep c Ab test that was positive in 2014  Risk:  Hx ivdu; quit 2 years ago as of 01/2020 Hx intranasal cocaine too Currently not active; and doing well with detox/remission. On methadone replacement  Told about hep c test 7 years ago but never follow up No hx ascites, increased abd girth, black/bloody stool, vomiting up blood, muscle wasting, confusion Broke left foot so has flares of left knee/foot pain No rash No weight loss No fatigue  We discussed natural history of hep c, transmission, complication We discussed good treatment options We discussed other conditions that may make his liver health worse such as dm/obesity/etoh  #social -no etoh; quit ivdu 2019 -smoke cigarette -unemployed; does undertable work -patient currently on methadone  replacement -drives himself there -lives with mother in [place], [place]   No Known Allergies    Outpatient Medications Prior to Visit  Medication Sig Dispense Refill  . atorvastatin (LIPITOR) 40 MG tablet Take 1 tablet (40 mg total) by mouth daily. 30 tablet 11  . Cod Liver Oil OIL Take by mouth.    . D3 SUPER STRENGTH 50 MCG (2000 UT) CAPS Take 1 capsule by mouth daily.    . Melatonin 10 MG CAPS Take by mouth.    . methadone (DOLOPHINE) 10 MG tablet Take 10 mg by mouth daily.    . nicotine polacrilex (NICORETTE) 2 MG gum Take 1 each (2 mg total) by mouth as needed for smoking cessation. 100 tablet 0  . Omega-3 Fatty Acids (FISH OIL PO) Take by mouth.    . traZODone (DESYREL) 100 MG tablet Take 1 tablet (100 mg total) by mouth at bedtime. 30 tablet 2   No facility-administered medications prior to visit.     Past Medical History:  Diagnosis Date  . Hepatitis C   . Methadone use    clinic each day      Past Surgical History:  Procedure Laterality Date  . HERNIA REPAIR      Fam hx: No liver disease dm2 both paternal sides    Social History   Socioeconomic History  . Marital status: Legally Separated    Spouse name: Not on file  . Number of children: Not on file  . Years of education: Not on file  . Highest education level: Not  on file  Occupational History  . Not on file  Tobacco Use  . Smoking status: Current Every Day Smoker    Packs/day: 0.50    Types: Cigarettes  . Smokeless tobacco: Never Used  Vaping Use  . Vaping Use: Never used  Substance and Sexual Activity  . Alcohol use: No  . Drug use: Not Currently    Types: Marijuana, "Crack" cocaine, Methamphetamines    Comment: last time he used 04/2014  . Sexual activity: Not on file  Other Topics Concern  . Not on file  Social History Narrative  . Not on file   Social Determinants of Health   Financial Resource Strain: Not on file  Food Insecurity: Not on file  Transportation Needs: Not on file   Physical Activity: Not on file  Stress: Not on file  Social Connections: Not on file  Intimate Partner Violence: Not on file      Review of Systems    Other ros negative  Objective:    BP (!) 142/88   Pulse 77   Temp 98 F (36.7 C) (Oral)   Wt 251 lb (113.9 kg)   BMI 34.04 kg/m  Nursing note and vital signs reviewed.  Physical Exam    obese, pleasant, no distress Heent: normocephalic; per; conj clear Neck supple cv rrr no mrg Lungs clear; normal respiratory effort abd s/nt Ext no edema Skin no rash Neuro nonfocal Psych alert/oriented msk no synovitis   Labs: 12/16 cr 0.85; lft 33/26; cbc 7/16/241  12/16 fib4 score 1.26  Micro:  Serology: 12/16 hep a ab nonreactive; hep b sab >1000, coreAb/sAg negative 12/16 hep c gt 1a, pcr 12/16 hiv negative  Imaging:  Assessment & Plan:   Patient Active Problem List   Diagnosis Date Noted  . Tobacco use disorder 01/22/2020  . Hyperlipidemia 01/22/2020  . Fibula fracture 05/31/2017  . Metatarsal fracture 05/31/2017  . History of basal cell carcinoma 04/05/2016  . Non-healing ulcer (HCC) 10/11/2015  . MDD (major depressive disorder), recurrent severe, without psychosis (HCC) 02/15/2015  . Cocaine use disorder, moderate, dependence (HCC) 09/17/2014  . Substance induced mood disorder (HCC) 09/17/2014  . Polysubstance abuse (HCC)   . Polysubstance dependence including opioid type drug, continuous use (HCC) 12/06/2013  . Hepatitis C antibody test positive 03/13/2013  . Elevated transaminase level 03/13/2013     Problem List Items Addressed This Visit   None   Visit Diagnoses    Chronic hepatitis C without hepatic coma (HCC)    -  Primary      #chronic hep c Labs here revealed chronic hep c. He'll need elastography, but clinically doesn't appear to have evidence of cirrhosis.  Prior treatment: no GT: 1a Evidence of cirrhosis: no Interested in treatment yes Potential DDI: patient taking  atorvastatin 40 but no ppi. I advised him once he is on Hep C treatment, he'll need to stop atorvastatin. He has no recent cva/cad event   -Labs: reviewed from previous -Imaging: will need abd u/s elastography -follow up: 2 weeks after liver u/s done -Meds planned: mavyret 8 weeks -discussed with our pharmacy team who will work on approval for his medications  -discussed natural progression of hep c, transmission (avoid sharing personal hygiene equipment) -discussed avoid toxin like etoh and excessive acetamaminphen (no more than 2 gram a day) -discussed healthy life style and good glucose control -discussed avoiding eating raw sea food -discussed we can treat hep c but can be reinfected -discussed hepatitis coinfection and vaccination  Follow-up: Return in about 4 weeks (around 03/04/2020).      Raymondo Band, MD [facility] -- -- pager   [phone] cell 02/05/2020, 10:03 AM

## 2020-02-06 LAB — CBC WITH DIFFERENTIAL/PLATELET
Absolute Monocytes: 580 cells/uL (ref 200–950)
Basophils Absolute: 7 cells/uL (ref 0–200)
Basophils Relative: 0.1 %
Eosinophils Absolute: 152 cells/uL (ref 15–500)
Eosinophils Relative: 2.2 %
HCT: 48 % (ref 38.5–50.0)
Hemoglobin: 16 g/dL (ref 13.2–17.1)
Lymphs Abs: 2249 cells/uL (ref 850–3900)
MCH: 29.4 pg (ref 27.0–33.0)
MCHC: 33.3 g/dL (ref 32.0–36.0)
MCV: 88.1 fL (ref 80.0–100.0)
MPV: 11 fL (ref 7.5–12.5)
Monocytes Relative: 8.4 %
Neutro Abs: 3912 cells/uL (ref 1500–7800)
Neutrophils Relative %: 56.7 %
Platelets: 241 10*3/uL (ref 140–400)
RBC: 5.45 10*6/uL (ref 4.20–5.80)
RDW: 12.7 % (ref 11.0–15.0)
Total Lymphocyte: 32.6 %
WBC: 6.9 10*3/uL (ref 3.8–10.8)

## 2020-02-06 LAB — PROTIME-INR
INR: 1
Prothrombin Time: 10.5 s (ref 9.0–11.5)

## 2020-02-06 LAB — HCV RNA,QUANTITATIVE REAL TIME PCR
HCV Quantitative Log: 6.49 Log IU/mL — ABNORMAL HIGH
HCV RNA, PCR, QN: 3060000 IU/mL — ABNORMAL HIGH

## 2020-02-06 LAB — HEPATITIS B SURFACE ANTIBODY, QUANTITATIVE: Hep B S AB Quant (Post): >1000 m[IU]/mL (ref >=10)

## 2020-02-06 LAB — COMPREHENSIVE METABOLIC PANEL
AG Ratio: 1.7 (calc) (ref 1.0–2.5)
ALT: 26 U/L (ref 9–46)
AST: 33 U/L (ref 10–40)
Albumin: 4.5 g/dL (ref 3.6–5.1)
Alkaline phosphatase (APISO): 85 U/L (ref 36–130)
BUN: 14 mg/dL (ref 7–25)
CO2: 29 mmol/L (ref 20–32)
Calcium: 9.7 mg/dL (ref 8.6–10.3)
Chloride: 101 mmol/L (ref 98–110)
Creat: 0.85 mg/dL (ref 0.60–1.35)
Globulin: 2.6 g/dL (calc) (ref 1.9–3.7)
Glucose, Bld: 94 mg/dL (ref 65–99)
Potassium: 4.6 mmol/L (ref 3.5–5.3)
Sodium: 137 mmol/L (ref 135–146)
Total Bilirubin: 0.5 mg/dL (ref 0.2–1.2)
Total Protein: 7.1 g/dL (ref 6.1–8.1)

## 2020-02-06 LAB — HEPATITIS C GENOTYPE

## 2020-02-06 LAB — HEPATITIS C RNA QUANTITATIVE
HCV Quantitative Log: 7.03 log IU/mL — ABNORMAL HIGH
HCV RNA, PCR, QN: 10700000 IU/mL — ABNORMAL HIGH

## 2020-02-06 LAB — HEPATITIS B CORE ANTIBODY, TOTAL: Hep B Core Total Ab: NONREACTIVE

## 2020-02-06 LAB — HEPATITIS B SURFACE ANTIGEN: Hepatitis B Surface Ag: NONREACTIVE

## 2020-02-06 LAB — HEPATITIS C ANTIBODY
Hepatitis C Ab: REACTIVE — AB
SIGNAL TO CUT-OFF: 32.4 — ABNORMAL HIGH (ref <1.00)

## 2020-02-06 LAB — HIV ANTIBODY (ROUTINE TESTING W REFLEX): HIV 1&2 Ab, 4th Generation: NONREACTIVE

## 2020-02-06 LAB — HEPATITIS A ANTIBODY, TOTAL: Hepatitis A AB,Total: NONREACTIVE

## 2020-02-12 ENCOUNTER — Other Ambulatory Visit: Payer: Self-pay

## 2020-02-12 ENCOUNTER — Ambulatory Visit (HOSPITAL_COMMUNITY)
Admission: RE | Admit: 2020-02-12 | Discharge: 2020-02-12 | Disposition: A | Discharge status: Home / Self Care | Payer: Self-pay | Source: Ambulatory Visit | Attending: Internal Medicine | Admitting: Internal Medicine

## 2020-02-12 DIAGNOSIS — B182 Chronic viral hepatitis C: Secondary | ICD-10-CM | POA: Insufficient documentation

## 2020-02-16 ENCOUNTER — Telehealth: Payer: Self-pay

## 2020-02-16 NOTE — Telephone Encounter (Signed)
[facility] Patient Advocate Encounter  Completed and sent MYABBVIE application for MAVYRET  for this patient who is uninsured.    Patient is approved 02/12/20 through 08/12/20.  I will contact patient to get the delivery date.   Clearance Coots, CPhT Specialty Pharmacy Patient [facility] for Infectious Disease Phone: [phone] Fax:  [phone]

## 2020-02-24 ENCOUNTER — Ambulatory Visit: Payer: Medicaid Other

## 2020-03-04 ENCOUNTER — Telehealth (INDEPENDENT_AMBULATORY_CARE_PROVIDER_SITE_OTHER): Payer: Self-pay | Admitting: Internal Medicine

## 2020-03-04 ENCOUNTER — Encounter: Payer: Self-pay | Admitting: Internal Medicine

## 2020-03-04 ENCOUNTER — Other Ambulatory Visit: Payer: Self-pay

## 2020-03-04 DIAGNOSIS — B182 Chronic viral hepatitis C: Secondary | ICD-10-CM

## 2020-03-04 DIAGNOSIS — H547 Unspecified visual loss: Secondary | ICD-10-CM

## 2020-03-04 NOTE — Patient Instructions (Signed)
Pharmacy to monitor and f/u on patient while on mavyret and 3 months post tx to document cure

## 2020-03-04 NOTE — Progress Notes (Signed)
[facility]  Patient Active Problem List   Diagnosis Date Noted  . Tobacco use disorder 01/22/2020  . Hyperlipidemia 01/22/2020  . Fibula fracture 05/31/2017  . Metatarsal fracture 05/31/2017  . History of basal cell carcinoma 04/05/2016  . Non-healing ulcer (HCC) 10/11/2015  . MDD (major depressive disorder), recurrent severe, without psychosis (HCC) 02/15/2015  . Cocaine use disorder, moderate, dependence (HCC) 09/17/2014  . Substance induced mood disorder (HCC) 09/17/2014  . Polysubstance abuse (HCC)   . Polysubstance dependence including opioid type drug, continuous use (HCC) 12/06/2013  . Hepatitis C antibody test positive 03/13/2013  . Elevated transaminase level 03/13/2013      Subjective:    Patient ID: Victor Gomez, male    DOB: Jul 28, 1975, 45 y.o.   MRN: [id number]  Chief Complaint  Patient presents with  . Follow-up    HPI:  Victor Gomez is a 45 y.o. male referred by pcp for chronic hep c  03/04/20 id f/u televideo visit I discussed the limitations of evaluation and management by telemedicine and the availability of in person appointments. The patietn expressed understanding and agreed to proceed.   Time spent: 25 Patient location: home Provider location: clinic Type of visit (video vs phone): attempted video unsuccessful; tried phone  Patient without concern today We reviewed the ultrasound elastography, including the renal findings. Patient reports passing kidney stone 6-7 months ago, and he think it was his left side. It appears previously our pharmacy team had contacted him and working on [employer] preauthorization -- patient mentioned he got a letter of approval and meds will be delivered on 1/26  Eating well, exercising, and feeling well No n/v/diarrhea, bloody/black stool Taking methadone so constipated No active street drugs use No EtOH use Trying to quit cigarette  He reports intermittent "once in a  bluemoon" but said once a month, 10-15 minutes when he is up and moving flashes of light/blurriness left eye. No pain, n/v, headache, focal weakness/numbness. No hx head trauma, stroke, blood clot. No hx diabetes. I reviewed recent labs from December and it appears his plasma glucose was in normal range. His a1c was 5.6. duration was 2 months. No family hx migraine. No hx of covid or family members with covid  Review of medications: Trazodone Atorvastatin Vit D No viagara or analogue  12/23 id f/u Reviewed labs; serology confirmed chronic hep c Patient without questions today  Background: ------------ I reviewed note from pcp and chart He only has a hep c Ab test that was positive in 2014  Risk:  Hx ivdu; quit 2 years ago as of 01/2020 Hx intranasal cocaine too Currently not active; and doing well with detox/remission. On methadone replacement  Told about hep c test 7 years ago but never follow up No hx ascites, increased abd girth, black/bloody stool, vomiting up blood, muscle wasting, confusion Broke left foot so has flares of left knee/foot pain No rash No weight loss No fatigue  We discussed natural history of hep c, transmission, complication We discussed good treatment options We discussed other conditions that may make his liver health worse such as dm/obesity/etoh  #social -no etoh; quit ivdu 2019 -smoke cigarette -unemployed; does undertable work -patient currently on methadone replacement -drives himself there -lives with mother in [place], [place]   No Known Allergies    Outpatient Medications Prior to Visit  Medication Sig Dispense Refill  . atorvastatin (LIPITOR) 40 MG tablet Take 1 tablet (40 mg total)  by mouth daily. 30 tablet 11  . Cod Liver Oil OIL Take by mouth.    . D3 SUPER STRENGTH 50 MCG (2000 UT) CAPS Take 1 capsule by mouth daily.    . Melatonin 10 MG CAPS Take by mouth.    . methadone (DOLOPHINE) 10 MG tablet Take 10 mg by mouth daily.    .  Omega-3 Fatty Acids (FISH OIL PO) Take by mouth.    . traZODone (DESYREL) 100 MG tablet Take 1 tablet (100 mg total) by mouth at bedtime. 30 tablet 2  . nicotine polacrilex (NICORETTE) 2 MG gum Take 1 each (2 mg total) by mouth as needed for smoking cessation. (Patient not taking: Reported on 03/04/2020) 100 tablet 0   No facility-administered medications prior to visit.     Past Medical History:  Diagnosis Date  . Hepatitis C   . Methadone use    clinic each day      Past Surgical History:  Procedure Laterality Date  . HERNIA REPAIR      Fam hx: No liver disease dm2 both paternal sides    Social History   Socioeconomic History  . Marital status: Legally Separated    Spouse name: Not on file  . Number of children: Not on file  . Years of education: Not on file  . Highest education level: Not on file  Occupational History  . Not on file  Tobacco Use  . Smoking status: Current Every Day Smoker    Packs/day: 0.50    Types: Cigarettes  . Smokeless tobacco: Never Used  Vaping Use  . Vaping Use: Never used  Substance and Sexual Activity  . Alcohol use: No  . Drug use: Not Currently    Types: Marijuana, "Crack" cocaine, Methamphetamines    Comment: last time he used 04/2014  . Sexual activity: Not on file  Other Topics Concern  . Not on file  Social History Narrative  . Not on file   Social Determinants of Health   Financial Resource Strain: Not on file  Food Insecurity: Not on file  Transportation Needs: Not on file  Physical Activity: Not on file  Stress: Not on file  Social Connections: Not on file  Intimate Partner Violence: Not on file      Review of Systems    Other ros negative  Objective:    There were no vitals taken for this visit. Nursing note and vital signs reviewed.  Physical Exam Not done as this is a phone visit   Labs: 12/16 cr 0.85; lft 33/26; cbc 7/16/241  12/16 fib4 score 1.26 01/2020 a1c 5.6  Micro:  Serology: 12/16  hep a ab nonreactive; hep b sab >1000, coreAb/sAg negative 12/16 hep c gt 1a, pcr 12/16 hiv negative  Imaging: 02/12/2020 liver u/s elastography ULTRASOUND ABDOMEN: 1. Suspected scarring in the left kidney lower pole and possibly the upper pole. 2. Small right mid kidney benign-appearing cyst. 3. No hepatic mass is identified. 4. Portions of the pancreas and abdominal aorta were obscured by overlying bowel gas.  ULTRASOUND HEPATIC ELASTOGRAPHY: Median kPa:  2.8 Diagnostic category:  < or = 5 kPa: high probability of being normal  Assessment & Plan:   Patient Active Problem List   Diagnosis Date Noted  . Tobacco use disorder 01/22/2020  . Hyperlipidemia 01/22/2020  . Fibula fracture 05/31/2017  . Metatarsal fracture 05/31/2017  . History of basal cell carcinoma 04/05/2016  . Non-healing ulcer (HCC) 10/11/2015  . MDD (major depressive disorder),  recurrent severe, without psychosis (HCC) 02/15/2015  . Cocaine use disorder, moderate, dependence (HCC) 09/17/2014  . Substance induced mood disorder (HCC) 09/17/2014  . Polysubstance abuse (HCC)   . Polysubstance dependence including opioid type drug, continuous use (HCC) 12/06/2013  . Hepatitis C antibody test positive 03/13/2013  . Elevated transaminase level 03/13/2013     Problem List Items Addressed This Visit   None     #chronic hep c Patient is set to take mavyret, which is being sent to him will be there 03/10/2020  Prior treatment: no GT: 1a Evidence of cirrhosis: no -- by fib4 and elastography Interested in treatment yes Potential DDI: patient taking atorvastatin 40 but no ppi. I advised him once he is on Hep C treatment, he'll need to stop atorvastatin. He has no recent cva/cad event   -patient to stop atorvastatin now for for the duration of mavyret -Labs & follow up: pharmacy team to monitor and follow with patient -Meds planned: mavyret 8 weeks -discussed with our pharmacy team who will work on  approval for his medications  -discussed natural progression of hep c, transmission (avoid sharing personal hygiene equipment) -discussed avoid toxin like etoh and excessive acetamaminphen (no more than 2 gram a day) -discussed healthy life style and good glucose control -discussed avoiding eating raw sea food -discussed we can treat hep c but can be reinfected -discussed hepatitis coinfection and vaccination (prior hep b immunizations and responded)   #left eye problem Unclear what this is but ddx tia/stroke, blood clot, atypical/complex migraine, seizure, vs medications (reviewed list nothing obvious), or retina detachment or neuritis or MS -advise him to let his pcp know and see ophthalmology/optometry.  -advise him if pain, persistent visual disturbance, headache, red eye need to go to ED   Follow-up: No follow-ups on file.      Raymondo Band, MD [facility] -- -- pager   [phone] cell 03/04/2020, 9:31 AM

## 2020-04-21 ENCOUNTER — Encounter: Payer: Self-pay | Admitting: Internal Medicine

## 2020-04-21 ENCOUNTER — Ambulatory Visit (INDEPENDENT_AMBULATORY_CARE_PROVIDER_SITE_OTHER): Payer: Self-pay | Admitting: Internal Medicine

## 2020-04-21 ENCOUNTER — Other Ambulatory Visit: Payer: Self-pay

## 2020-04-21 VITALS — BP 124/80 | HR 69 | Temp 98.1°F | Ht 72.0 in | Wt 255.0 lb

## 2020-04-21 DIAGNOSIS — B182 Chronic viral hepatitis C: Secondary | ICD-10-CM

## 2020-04-21 NOTE — Patient Instructions (Signed)
Finish Chief Operating Officer  Once you finish, you can resume atorvastatin (lipitor), and if you need acid blocker at that time you can use  Do blood test no earlier than 3 month (90 days) after you finish mavyret. Ordered for 2nd week of july  See me a week after blood tests is done

## 2020-04-21 NOTE — Progress Notes (Signed)
[facility]  Patient Active Problem List   Diagnosis Date Noted  . Tobacco use disorder 01/22/2020  . Hyperlipidemia 01/22/2020  . Fibula fracture 05/31/2017  . Metatarsal fracture 05/31/2017  . History of basal cell carcinoma 04/05/2016  . Non-healing ulcer (HCC) 10/11/2015  . MDD (major depressive disorder), recurrent severe, without psychosis (HCC) 02/15/2015  . Cocaine use disorder, moderate, dependence (HCC) 09/17/2014  . Substance induced mood disorder (HCC) 09/17/2014  . Polysubstance abuse (HCC)   . Polysubstance dependence including opioid type drug, continuous use (HCC) 12/06/2013  . Hepatitis C antibody test positive 03/13/2013  . Elevated transaminase level 03/13/2013      Subjective:    Patient ID: Victor Gomez, male    DOB: 02/25/1975, 45 y.o.   MRN: [id number]  Chief Complaint  Patient presents with  . Follow-up    HPI:  Victor Gomez is a 45 y.o. male referred by pcp for chronic hep c  04/21/20 id visit Patient started planned 8 week mavyret for the past 5 weeks; have 3 more weeks to go Feels a little more tired/headache but had resolved long ago No missing dose with mavyret No rash, n/v/diarrhea No new medications I reviewed his meds brought today (mostly over the counter medication) no acid suppression. No statin. He does take garlic pill, omega, vitamin d pills.  Left eye sx had resolved long ago. No optometry visit  03/04/20 id f/u televideo visit I discussed the limitations of evaluation and management by telemedicine and the availability of in person appointments. The patietn expressed understanding and agreed to proceed.   Time spent: 25 Patient location: home Provider location: clinic Type of visit (video vs phone): attempted video unsuccessful; tried phone  Patient without concern today We reviewed the ultrasound elastography, including the renal findings. Patient reports passing kidney stone 6-7  months ago, and he think it was his left side. It appears previously our pharmacy team had contacted him and working on [employer] preauthorization -- patient mentioned he got a letter of approval and meds will be delivered on 1/26  Eating well, exercising, and feeling well No n/v/diarrhea, bloody/black stool Taking methadone so constipated No active street drugs use No EtOH use Trying to quit cigarette  He reports intermittent "once in a bluemoon" but said once a month, 10-15 minutes when he is up and moving flashes of light/blurriness left eye. No pain, n/v, headache, focal weakness/numbness. No hx head trauma, stroke, blood clot. No hx diabetes. I reviewed recent labs from December and it appears his plasma glucose was in normal range. His a1c was 5.6. duration was 2 months. No family hx migraine. No hx of covid or family members with covid  Review of medications: Trazodone Atorvastatin Vit D No viagara or analogue  12/23 id f/u Reviewed labs; serology confirmed chronic hep c Patient without questions today  Background: ------------ I reviewed note from pcp and chart He only has a hep c Ab test that was positive in 2014  Risk:  Hx ivdu; quit 2 years ago as of 01/2020 Hx intranasal cocaine too Currently not active; and doing well with detox/remission. On methadone replacement  Told about hep c test 7 years ago but never follow up No hx ascites, increased abd girth, black/bloody stool, vomiting up blood, muscle wasting, confusion Broke left foot so has flares of left knee/foot pain No rash No weight loss No fatigue  We discussed natural history of hep c, transmission,  complication We discussed good treatment options We discussed other conditions that may make his liver health worse such as dm/obesity/etoh  #social -no etoh; quit ivdu 2019 -smoke cigarette -unemployed; does undertable work -patient currently on methadone replacement -drives himself there -lives with  mother in [place], [place]   No Known Allergies    Outpatient Medications Prior to Visit  Medication Sig Dispense Refill  . D3 SUPER STRENGTH 50 MCG (2000 UT) CAPS Take 1 capsule by mouth daily.    . Melatonin 10 MG CAPS Take by mouth.    . methadone (DOLOPHINE) 10 MG tablet Take 10 mg by mouth daily.    . nicotine polacrilex (NICORETTE) 2 MG gum Take 1 each (2 mg total) by mouth as needed for smoking cessation. 100 tablet 0  . Omega-3 Fatty Acids (FISH OIL PO) Take by mouth.    . traZODone (DESYREL) 100 MG tablet Take 1 tablet (100 mg total) by mouth at bedtime. 30 tablet 2  . atorvastatin (LIPITOR) 40 MG tablet Take 1 tablet (40 mg total) by mouth daily. (Patient not taking: Reported on 04/21/2020) 30 tablet 11  . Cod Liver Oil OIL Take by mouth. (Patient not taking: Reported on 04/21/2020)     No facility-administered medications prior to visit.     Past Medical History:  Diagnosis Date  . Hepatitis C   . Methadone use    clinic each day      Past Surgical History:  Procedure Laterality Date  . HERNIA REPAIR      Fam hx: No liver disease dm2 both paternal sides    Social History   Socioeconomic History  . Marital status: Legally Separated    Spouse name: Not on file  . Number of children: Not on file  . Years of education: Not on file  . Highest education level: Not on file  Occupational History  . Not on file  Tobacco Use  . Smoking status: Current Every Day Smoker    Packs/day: 0.50    Types: Cigarettes  . Smokeless tobacco: Never Used  . Tobacco comment: 1 pack every 1-2 days   Vaping Use  . Vaping Use: Never used  Substance and Sexual Activity  . Alcohol use: No  . Drug use: Not Currently    Types: Marijuana, "Crack" cocaine, Methamphetamines    Comment: last time he used 04/2014  . Sexual activity: Not on file  Other Topics Concern  . Not on file  Social History Narrative  . Not on file   Social Determinants of Health   Financial Resource  Strain: Not on file  Food Insecurity: Not on file  Transportation Needs: Not on file  Physical Activity: Not on file  Stress: Not on file  Social Connections: Not on file  Intimate Partner Violence: Not on file      Review of Systems    Other ros negative  Objective:    BP 124/80   Pulse 69   Temp 98.1 F (36.7 C) (Oral)   Ht 6' (1.829 m)   Wt 255 lb (115.7 kg)   SpO2 95%   BMI 34.58 kg/m  Nursing note and vital signs reviewed.  Physical Exam Obese, no distress, pleasant Heent: normocephalic; per; conj clear Neck supple cv rrr no mrg Lungs clear abd s/nt Ext no edema Skin no rash Neuro nonfocal   Labs: 12/16 cr 0.85; lft 33/26; cbc 7/16/241  12/16 fib4 score 1.26 01/2020 a1c 5.6  Micro:  Serology: 12/16 hep a ab  nonreactive; hep b sab >1000, coreAb/sAg negative 12/16 hep c gt 1a, pcr 12/16 hiv negative  Imaging: 02/12/2020 liver u/s elastography ULTRASOUND ABDOMEN: 1. Suspected scarring in the left kidney lower pole and possibly the upper pole. 2. Small right mid kidney benign-appearing cyst. 3. No hepatic mass is identified. 4. Portions of the pancreas and abdominal aorta were obscured by overlying bowel gas.  ULTRASOUND HEPATIC ELASTOGRAPHY: Median kPa:  2.8 Diagnostic category:  < or = 5 kPa: high probability of being normal  Assessment & Plan:   Patient Active Problem List   Diagnosis Date Noted  . Tobacco use disorder 01/22/2020  . Hyperlipidemia 01/22/2020  . Fibula fracture 05/31/2017  . Metatarsal fracture 05/31/2017  . History of basal cell carcinoma 04/05/2016  . Non-healing ulcer (HCC) 10/11/2015  . MDD (major depressive disorder), recurrent severe, without psychosis (HCC) 02/15/2015  . Cocaine use disorder, moderate, dependence (HCC) 09/17/2014  . Substance induced mood disorder (HCC) 09/17/2014  . Polysubstance abuse (HCC)   . Polysubstance dependence including opioid type drug, continuous use (HCC) 12/06/2013   . Hepatitis C antibody test positive 03/13/2013  . Elevated transaminase level 03/13/2013     Problem List Items Addressed This Visit   None   Visit Diagnoses    Chronic hepatitis C without hepatic coma (HCC)    -  Primary   Chronic hepatitis C with hepatic coma (HCC)          #chronic hep c Patient is in week 5 of 8 of mavyret, initial headache/fatigue resolved. Feeling well  Prior treatment: no GT: 1a Evidence of cirrhosis: no -- by fib4 and elastography Interested in treatment yes Potential DDI: patient taking atorvastatin 40 but no ppi. I advised him once he is on Hep C treatment, he'll need to stop atorvastatin. He has no recent cva/cad event   -continue to hold atorvastatin now for for the duration of mavyret -f/u 3 months after mavyret is finished for virologic cure testing -discussed with our pharmacy team who will work on approval for his medications  -discussed natural progression of hep c, transmission (avoid sharing personal hygiene equipment) -discussed avoid toxin like etoh and excessive acetamaminphen (no more than 2 gram a day) -discussed healthy life style and good glucose control -discussed avoiding eating raw sea food -discussed we can treat hep c but can be reinfected -discussed hepatitis coinfection and vaccination (prior hep b immunizations and responded)   #left eye problem Unclear what this is but ddx tia/stroke, blood clot, atypical/complex migraine, seizure, vs medications (reviewed list nothing obvious), or retina detachment or neuritis or MS It had resolved several weeks ago before this visit on 04/21/20. He never saw optometry or ophthalmology -monitor and see eye doctor as needed  Follow-up: Return in about 4 months (around 08/21/2020).      Raymondo Band, MD [facility] -- -- pager   [phone] cell 04/21/2020, 4:28 PM

## 2020-04-22 ENCOUNTER — Telehealth: Payer: Self-pay

## 2020-04-22 NOTE — Telephone Encounter (Signed)
Attempted to call patient to reschedule lab appointment. Per Dr. Renold Don, patient should wait to get lab work done until the second week of July. Will send MyChart message.   Sandie Ano, RN

## 2020-04-26 NOTE — Telephone Encounter (Signed)
RN spoke with patient's mother, who accompanied him to his last appointment with [place], and requested that she have the patient call us back to reschedule a lab appointment.   Sandie Ano, RN

## 2020-04-27 ENCOUNTER — Other Ambulatory Visit: Payer: Self-pay | Admitting: Student

## 2020-04-27 DIAGNOSIS — G4700 Insomnia, unspecified: Secondary | ICD-10-CM

## 2020-04-27 NOTE — Telephone Encounter (Signed)
RN spoke to patient's mother to reschedule his lab appointment for 08/19/20 as she accompanies patient to his appointments and is listed as contact in patient's chart.   Sandie Ano, RN

## 2020-07-22 ENCOUNTER — Other Ambulatory Visit: Payer: Medicaid Other

## 2020-08-17 ENCOUNTER — Encounter: Payer: Self-pay | Admitting: *Deleted

## 2020-08-19 ENCOUNTER — Other Ambulatory Visit: Payer: Self-pay | Admitting: Internal Medicine

## 2020-08-19 ENCOUNTER — Other Ambulatory Visit: Payer: Self-pay

## 2020-08-19 DIAGNOSIS — B182 Chronic viral hepatitis C: Secondary | ICD-10-CM

## 2020-08-21 LAB — CBC
HCT: 48.6 % (ref 38.5–50.0)
Hemoglobin: 16.2 g/dL (ref 13.2–17.1)
MCH: 29.7 pg (ref 27.0–33.0)
MCHC: 33.3 g/dL (ref 32.0–36.0)
MCV: 89 fL (ref 80.0–100.0)
MPV: 11.6 fL (ref 7.5–12.5)
Platelets: 248 10*3/uL (ref 140–400)
RBC: 5.46 10*6/uL (ref 4.20–5.80)
RDW: 13.1 % (ref 11.0–15.0)
WBC: 8 10*3/uL (ref 3.8–10.8)

## 2020-08-21 LAB — COMPREHENSIVE METABOLIC PANEL
AG Ratio: 1.7 (calc) (ref 1.0–2.5)
ALT: 19 U/L (ref 9–46)
AST: 28 U/L (ref 10–40)
Albumin: 4.5 g/dL (ref 3.6–5.1)
Alkaline phosphatase (APISO): 71 U/L (ref 36–130)
BUN: 14 mg/dL (ref 7–25)
CO2: 25 mmol/L (ref 20–32)
Calcium: 9.5 mg/dL (ref 8.6–10.3)
Chloride: 103 mmol/L (ref 98–110)
Creat: 0.96 mg/dL (ref 0.60–1.35)
Globulin: 2.7 g/dL (calc) (ref 1.9–3.7)
Glucose, Bld: 113 mg/dL — ABNORMAL HIGH (ref 65–99)
Potassium: 4.4 mmol/L (ref 3.5–5.3)
Sodium: 137 mmol/L (ref 135–146)
Total Bilirubin: 0.4 mg/dL (ref 0.2–1.2)
Total Protein: 7.2 g/dL (ref 6.1–8.1)

## 2020-08-21 LAB — HEPATITIS C RNA QUANTITATIVE
HCV Quantitative Log: <1.18 log IU/mL
HCV RNA, PCR, QN: <15 IU/mL

## 2020-09-01 ENCOUNTER — Ambulatory Visit: Payer: Self-pay | Admitting: Internal Medicine

## 2020-09-10 ENCOUNTER — Other Ambulatory Visit: Payer: Self-pay

## 2020-09-10 ENCOUNTER — Ambulatory Visit (INDEPENDENT_AMBULATORY_CARE_PROVIDER_SITE_OTHER): Payer: Self-pay | Admitting: Internal Medicine

## 2020-09-10 ENCOUNTER — Encounter: Payer: Self-pay | Admitting: Internal Medicine

## 2020-09-10 VITALS — BP 131/90 | HR 97 | Temp 98.4°F | Wt 236.0 lb

## 2020-09-10 DIAGNOSIS — B182 Chronic viral hepatitis C: Secondary | ICD-10-CM

## 2020-09-10 DIAGNOSIS — L723 Sebaceous cyst: Secondary | ICD-10-CM

## 2020-09-10 NOTE — Progress Notes (Signed)
[facility]  Patient Active Problem List   Diagnosis Date Noted   Tobacco use disorder 01/22/2020   Hyperlipidemia 01/22/2020   Fibula fracture 05/31/2017   Metatarsal fracture 05/31/2017   History of basal cell carcinoma 04/05/2016   Non-healing ulcer (HCC) 10/11/2015   MDD (major depressive disorder), recurrent severe, without psychosis (HCC) 02/15/2015   Cocaine use disorder, moderate, dependence (HCC) 09/17/2014   Substance induced mood disorder (HCC) 09/17/2014   Polysubstance abuse (HCC)    Polysubstance dependence including opioid type drug, continuous use (HCC) 12/06/2013   Hepatitis C antibody test positive 03/13/2013   Elevated transaminase level 03/13/2013      Subjective:    Patient ID: Victor Gomez, male    DOB: [birth year], 45 y.o.   MRN: [id number]  Chief Complaint  Patient presents with   Follow-up    Chronic hepatitis C without hepatic coma     HPI:  Victor Gomez is a 45 y.o. male referred by pcp for chronic hep c  09/10/20 id visit Patient finished 8 week mavyret end of 04/2020 He had repeat hep c viral load that is undetectable on7/07/22 He is vaccinated against hep B He is here today with a rash on his upper posterior trunk - has had it before which came and go. Some discomfort No f/c No joint pain No myalgia No flu like sx No further risk for blood borne disease    04/21/20 id visit Patient started planned 8 week mavyret for the past 5 weeks; have 3 more weeks to go Feels a little more tired/headache but had resolved long ago No missing dose with mavyret No rash, n/v/diarrhea No new medications I reviewed his meds brought today (mostly over the counter medication) no acid suppression. No statin. He does take garlic pill, omega, vitamin d pills.  Left eye sx had resolved long ago. No optometry visit  03/04/20 id f/u televideo visit I discussed the limitations of evaluation and management by  telemedicine and the availability of in person appointments. The patietn expressed understanding and agreed to proceed.   Time spent: 25 Patient location: home Provider location: clinic Type of visit (video vs phone): attempted video unsuccessful; tried phone  Patient without concern today We reviewed the ultrasound elastography, including the renal findings. Patient reports passing kidney stone 6-7 months ago, and he think it was his left side. It appears previously our pharmacy team had contacted him and working on [employer] preauthorization -- patient mentioned he got a letter of approval and meds will be delivered on 1/26  Eating well, exercising, and feeling well No n/v/diarrhea, bloody/black stool Taking methadone so constipated No active street drugs use No EtOH use Trying to quit cigarette  He reports intermittent "once in a bluemoon" but said once a month, 10-15 minutes when he is up and moving flashes of light/blurriness left eye. No pain, n/v, headache, focal weakness/numbness. No hx head trauma, stroke, blood clot. No hx diabetes. I reviewed recent labs from December and it appears his plasma glucose was in normal range. His a1c was 5.6. duration was 2 months. No family hx migraine. No hx of covid or family members with covid  Review of medications: Trazodone Atorvastatin Vit D No viagara or analogue  12/23 id f/u Reviewed labs; serology confirmed chronic hep c Patient without questions today  Background: ------------ I reviewed note from pcp and chart He only has a hep c Ab test that was positive  in 2014  Risk:  Hx ivdu; quit 2 years ago as of 01/2020 Hx intranasal cocaine too Currently not active; and doing well with detox/remission. On methadone replacement  Told about hep c test 7 years ago but never follow up No hx ascites, increased abd girth, black/bloody stool, vomiting up blood, muscle wasting, confusion Broke left foot so has flares of left knee/foot  pain No rash No weight loss No fatigue  We discussed natural history of hep c, transmission, complication We discussed good treatment options We discussed other conditions that may make his liver health worse such as dm/obesity/etoh  #social -no etoh; quit ivdu 2019 -smoke cigarette -unemployed; does undertable work -patient currently on methadone replacement -drives himself there -lives with mother in [place], [place]   No Known Allergies    Outpatient Medications Prior to Visit  Medication Sig Dispense Refill   methadone (DOLOPHINE) 10 MG tablet Take 10 mg by mouth daily.     atorvastatin (LIPITOR) 40 MG tablet Take 1 tablet (40 mg total) by mouth daily. (Patient not taking: Reported on 09/10/2020) 30 tablet 11   Cod Liver Oil OIL Take by mouth. (Patient not taking: Reported on 09/10/2020)     D3 SUPER STRENGTH 50 MCG (2000 UT) CAPS Take 1 capsule by mouth daily. (Patient not taking: Reported on 09/10/2020)     Melatonin 10 MG CAPS Take by mouth. (Patient not taking: Reported on 09/10/2020)     nicotine polacrilex (NICORETTE) 2 MG gum Take 1 each (2 mg total) by mouth as needed for smoking cessation. (Patient not taking: Reported on 09/10/2020) 100 tablet 0   Omega-3 Fatty Acids (FISH OIL PO) Take by mouth. (Patient not taking: Reported on 09/10/2020)     traZODone (DESYREL) 100 MG tablet TAKE 1 TABLET BY MOUTH AT BEDTIME (Patient not taking: Reported on 09/10/2020) 30 tablet 2   No facility-administered medications prior to visit.     Past Medical History:  Diagnosis Date   Hepatitis C    Methadone use    clinic each day      Past Surgical History:  Procedure Laterality Date   HERNIA REPAIR      Fam hx: No liver disease dm2 both paternal sides    Social History   Socioeconomic History   Marital status: Legally Separated    Spouse name: Not on file   Number of children: Not on file   Years of education: Not on file   Highest education level: Not on file   Occupational History   Not on file  Tobacco Use   Smoking status: Every Day    Packs/day: 0.50    Types: Cigarettes   Smokeless tobacco: Never   Tobacco comments:    1 pack every 1-2 days   Vaping Use   Vaping Use: Never used  Substance and Sexual Activity   Alcohol use: No   Drug use: Not Currently    Types: Marijuana, "Crack" cocaine, Methamphetamines    Comment: last time he used 04/2014   Sexual activity: Not on file  Other Topics Concern   Not on file  Social History Narrative   Not on file   Social Determinants of Health   Financial Resource Strain: Not on file  Food Insecurity: Not on file  Transportation Needs: Not on file  Physical Activity: Not on file  Stress: Not on file  Social Connections: Not on file  Intimate Partner Violence: Not on file      Review of Systems  Other ros negative  Objective:    BP 131/90   Pulse 97   Temp 98.4 F (36.9 C) (Oral)   Wt 236 lb (107 kg)   SpO2 95%   BMI 32.01 kg/m  Nursing note and vital signs reviewed.  Physical Exam General/constitutional: no distress, pleasant; well developed HEENT: Normocephalic, PER, Conj Clear, EOMI, Oropharynx clear Neck supple CV: rrr no mrg Lungs: clear to auscultation, normal respiratory effort Abd: Soft, Nontender Ext: no edema Skin: indurated 2 inch mildly tender upper back cyst with some cheezy discharge on expression; minimal surrounding erythema, no warmth, no fluctuance Neuro: nonfocal MSK: no peripheral joint swelling/tenderness/warmth; back spines nontender    Labs: Lab Results  Component Value Date   WBC 8.0 08/19/2020   HGB 16.2 08/19/2020   HCT 48.6 08/19/2020   MCV 89.0 08/19/2020   PLT 248 08/19/2020   Last metabolic panel Lab Results  Component Value Date   GLUCOSE 113 (H) 08/19/2020   NA 137 08/19/2020   K 4.4 08/19/2020   CL 103 08/19/2020   CO2 25 08/19/2020   BUN 14 08/19/2020   CREATININE 0.96 08/19/2020   GFRNONAA >60 02/19/2018   GFRAA  >60 02/19/2018   CALCIUM 9.5 08/19/2020   PROT 7.2 08/19/2020   ALBUMIN 4.0 02/19/2018   LABGLOB 2.9 05/08/2016   AGRATIO 1.7 05/08/2016   BILITOT 0.4 08/19/2020   ALKPHOS 64 02/19/2018   AST 28 08/19/2020   ALT 19 08/19/2020   ANIONGAP 7 02/19/2018    12/16 cr 0.85; lft 33/26; cbc 7/16/241  12/16 fib4 score 1.26 01/2020 a1c 5.6  Micro:  Serology: 07/07 hep c pcr undetectable  12/16 hep a ab nonreactive; hep b sab >1000, coreAb/sAg negative 12/16 hep c gt 1a, pcr 12/16 hiv negative  Imaging: 02/12/2020 liver u/s elastography ULTRASOUND ABDOMEN:  1. Suspected scarring in the left kidney lower pole and possibly the upper pole. 2. Small right mid kidney benign-appearing cyst. 3. No hepatic mass is identified. 4. Portions of the pancreas and abdominal aorta were obscured by overlying bowel gas.   ULTRASOUND HEPATIC ELASTOGRAPHY:  Median kPa:  2.8 Diagnostic category:  < or = 5 kPa: high probability of being normal  Assessment & Plan:   Patient Active Problem List   Diagnosis Date Noted   Tobacco use disorder 01/22/2020   Hyperlipidemia 01/22/2020   Fibula fracture 05/31/2017   Metatarsal fracture 05/31/2017   History of basal cell carcinoma 04/05/2016   Non-healing ulcer (HCC) 10/11/2015   MDD (major depressive disorder), recurrent severe, without psychosis (HCC) 02/15/2015   Cocaine use disorder, moderate, dependence (HCC) 09/17/2014   Substance induced mood disorder (HCC) 09/17/2014   Polysubstance abuse (HCC)    Polysubstance dependence including opioid type drug, continuous use (HCC) 12/06/2013   Hepatitis C antibody test positive 03/13/2013   Elevated transaminase level 03/13/2013     Problem List Items Addressed This Visit   None Visit Diagnoses     Chronic hepatitis C without hepatic coma (HCC)    -  Primary   Sebaceous cyst          #chronic hep c Patient is in week 5 of 8 of mavyret, initial headache/fatigue resolved. Feeling  well  Prior treatment: no GT: 1a Evidence of cirrhosis: no -- by fib4 and elastography Interested in treatment yes Potential DDI: patient taking atorvastatin 40 but no ppi. I advised him once he is on Hep C treatment, he'll need to stop atorvastatin. He has no recent cva/cad event  Patient finished hep c 8 week mavyret tx end of 04/2020; 3 months post tx viral load negative so is cured  -resume atovarstatin -avoid risk factors that woud get him infected again -advise ongoing lifestyle change to promate weight loss; he is obese -protected against hep b (sAb >10)  #sebacous cyst A little inflammed, doesn't quite appear infected -advise warm compress -f/u pcp if continue to incresae size/tenderness as might need excision   Follow-up: No follow-ups on file.      Raymondo Band, MD [facility] -- -- pager   [phone] cell 09/10/2020, 2:20 PM

## 2020-09-10 NOTE — Patient Instructions (Signed)
Continue a good healthy lifestyle and try to loose weight to get to BMI around 25 Being overweight can cause liver cirrhosis   If your cyst gets bigger and more painful, please see your regular doctor. It might need excision which your regular doctor could do   No need for follow up with our ID clinic

## 2021-02-25 ENCOUNTER — Ambulatory Visit (HOSPITAL_COMMUNITY)
Admission: EM | Admit: 2021-02-25 | Discharge: 2021-02-25 | Disposition: A | Discharge status: Home / Self Care | Payer: No Payment, Other

## 2021-02-25 DIAGNOSIS — F332 Major depressive disorder, recurrent severe without psychotic features: Secondary | ICD-10-CM

## 2021-02-25 DIAGNOSIS — F1120 Opioid dependence, uncomplicated: Secondary | ICD-10-CM

## 2021-02-25 DIAGNOSIS — F1994 Other psychoactive substance use, unspecified with psychoactive substance-induced mood disorder: Secondary | ICD-10-CM

## 2021-02-25 NOTE — Discharge Instructions (Signed)

## 2021-02-25 NOTE — ED Notes (Signed)
Patient received After Visit Summary  with community resources.  Denies SI, HI and AVH at time of discharge. Patient verbalized understanding. Patient received all belongings from [facility] locker.

## 2021-02-25 NOTE — ED Provider Notes (Signed)
Behavioral Health Urgent Care Medical Screening Exam  Patient Name: Victor Gomez MRN: [id number] Date of Evaluation: 02/25/21 Chief Complaint:   Diagnosis:  Final diagnoses:  Polysubstance dependence including opioid type drug, continuous use ([place])  Substance induced mood disorder ([place])  MDD (major depressive disorder), recurrent severe, without psychosis ([place])    History of Present illness: Victor Gomez is a 46 y.o. male. Patient presents voluntarily to [facility] behavioral health for walk-in assessment.  Patient is accompanied by his mother, [name].  Fawwaz reports he has primarily being seen today at the request of his mother.  He states "we (patient and his mother )have not been getting along, I slipped up and relapsed this past week ."  Patient reports use of cocaine and heroin, last use of cocaine and heroin 2 days ago.  Also endorses alcohol use, last alcohol use last night. Patient has a history of polysubstance use disorder.  He most recently was treated at [facility], discharged home 02/04/2021.  Patient reports he argues with his mother particularly when something is missing from the home.  He reports his mother often believes he is responsible for missing items in the home.  He reports she consistently "fusses at me."  Patient endorses increased paranoia, particularly when using substances.  He reports his ex-girlfriend died from an overdose however he has concerns that something more sinister may have happened.  Also he reports paranoia surrounding shootings that have occurred in his neighborhood.  Patient has been diagnosed with major depressive disorder.  He is not currently linked with outpatient psychiatry but plans to follow-up with outpatient psychiatry moving forward.  Patient reports he feels that he should be diagnosed with ADHD as well as bipolar disorder.  He reports he is currently seeking disability benefits.  He shares that his father was diagnosed  with paranoid schizophrenia.  He is compliant with current medications including hydroxyzine and an additional medication, he is unable to recall name of medication at this time.  Additionally he reports compliance with methadone.  He is followed at methadone clinic, [facility] drug [facility] in [place].  He also meets with substance use treatment group at alcohol and drug services 3 times per week.  Patient is assessed face-to-face by nurse practitioner.  He is seated in assessment area, no acute distress.  He is alert and oriented, pleasant and cooperative during assessment.   He presents with euthymic mood, congruent affect. He denies suicidal and homicidal ideations.  He denies history of suicide attempts, denies history of nonsuicidal self-harm behavior.  He contracts verbally for safety with this Probation officer.  He has normal speech and behavior.  He denies both auditory and visual hallucinations.  Patient is able to converse coherently with goal-directed thoughts and no distractibility or preoccupation.     Victor Gomez resides in [place] with his mother.  He denies access to weapons.  Patient endorses average sleep and appetite.  Patient offered support and encouragement.  He gives verbal consent to speak with his mother, [name].  Patient's mother reports frustration that patient continues to use substances and does not follow her instructions.  Patient and his mother discussed potential plan to have patient move out of mother's home and into his own place.  At this time finances are a hindrance to this plan.   Psychiatric Specialty Exam  Presentation  General Appearance:Appropriate for Environment; Casual  Eye Contact:Good  Speech:Clear and Coherent; Normal Rate  Speech Volume:Normal  Handedness:Right   Mood and Affect  Mood:Euthymic  Affect:Appropriate; Congruent   Thought Process  Thought Processes:Coherent; Goal Directed; Linear  Descriptions of  Associations:Intact  Orientation:Full (Time, Place and Person)  Thought Content:Paranoid Ideation  Diagnosis of Schizophrenia or Schizoaffective disorder in past: No  Duration of Psychotic Symptoms: Less than six months  Hallucinations:None  Ideas of Reference:None  Suicidal Thoughts:No  Homicidal Thoughts:No   Sensorium  Memory:Immediate Good; Recent Fair; Remote Fair  Judgment:Fair  Insight:Fair   Executive Functions  Concentration:Good  Attention Span:Good  [place]  Language:Good   Psychomotor Activity  Psychomotor Activity:Normal   Assets  Assets:Communication Skills; Desire for Improvement; Housing; Leisure Time; Physical Health; Resilience; Social Support   Sleep  Sleep:Good  Number of hours: No data recorded  No data recorded  Physical Exam: Physical Exam Vitals and nursing note reviewed.  Constitutional:      Appearance: Normal appearance. He is well-developed and normal weight.  HENT:     Head: Normocephalic and atraumatic.     Nose: Nose normal.  Cardiovascular:     Rate and Rhythm: Normal rate.  Pulmonary:     Effort: Pulmonary effort is normal.  Musculoskeletal:        General: Normal range of motion.     Cervical back: Normal range of motion.  Skin:    General: Skin is warm and dry.  Neurological:     Mental Status: He is alert and oriented to person, place, and time.  Psychiatric:        Attention and Perception: Attention and perception normal.        Mood and Affect: Mood and affect normal.        Speech: Speech normal.        Behavior: Behavior normal. Behavior is cooperative.        Thought Content: Thought content is paranoid.        Cognition and Memory: Cognition and memory normal.        Judgment: Judgment normal.   Review of Systems  Constitutional: Negative.   HENT: Negative.    Eyes: Negative.   Respiratory: Negative.    Cardiovascular: Negative.   Gastrointestinal: Negative.    Genitourinary: Negative.   Musculoskeletal: Negative.   Skin: Negative.   Neurological: Negative.   Endo/Heme/Allergies: Negative.   Psychiatric/Behavioral:  Positive for substance abuse.   Blood pressure 121/74, pulse 98, temperature 98.1 F (36.7 C), resp. rate 18, SpO2 100 %. There is no height or weight on file to calculate BMI.  Musculoskeletal: Strength & Muscle Tone: within normal limits Gait & Station: normal Patient leans: N/A   [place] [facility] Discharge Disposition for Follow up and Recommendations: Based on my evaluation the patient does not appear to have an emergency medical condition and can be discharged with resources and follow up care in outpatient services for Medication Management and Individual Therapy Patient reviewed with Dr. Serafina Mitchell. Follow-up with outpatient psychiatry, resources provided. Continue current medications.   Lucky Rathke, FNP 02/25/2021, 5:30 PM

## 2021-02-25 NOTE — Progress Notes (Signed)
°   02/25/21 1542  [facility] Triage Screening (Walk-ins at [facility] only)  How Did You Hear About [place]? Self  What Is the Reason for Your Visit/Call Today? 46 year old present with mom. Mom report patient is delusional, disorganized thought, pacing, and fidgety. Patient is denying anything mom reports. Patient was recently discharged [facility] in [place], [place]. Patient denied suicidal / homicidal ideations and denied auditory / visual hallucinations. Patient report feeling paranoid due to people are putting stuff in drugs and overdosing. Patient reported he relapsed after a few days of being discharge. Drug of choice opiates, cocaine, meth and xanax. Last use yesterday 02/24/2021.  How Long Has This Been Causing You Problems? <Week  Have You Recently Had Any Thoughts About Hurting Yourself? No  Are You Planning to Commit Suicide/Harm Yourself At This time? No  Have you Recently Had Thoughts About Hurting Someone [name]? No  Are You Planning To Harm Someone At This Time? No  Are you currently experiencing any auditory, visual or other hallucinations? No  Have You Used Any Alcohol or Drugs in the Past 24 Hours? Yes  How long ago did you use Drugs or Alcohol? yesterday 02/24/2021  What Did You Use and How Much? amount varies  Do you have any current medical co-morbidities that require immediate attention? No  Clinician description of patient physical appearance/behavior: patient is cooperative and pleasant, well groomed.  What Do You Feel Would Help You the Most Today? Stress Management;Alcohol or Drug Use Treatment  If access to [facility] was not available, would you have sought care in the Emergency Department? No  Determination of Need Routine (7 days)  Options For Referral Chemical Dependency Intensive Outpatient Therapy (CDIOP);Outpatient Therapy

## 2021-03-09 ENCOUNTER — Telehealth (HOSPITAL_COMMUNITY): Payer: Self-pay | Admitting: Student

## 2021-03-09 NOTE — BH Assessment (Signed)
Care Management - [facility] Follow Up Discharges  ° °Writer attempted to make contact with patient today and was unsuccessful.  Writer left a HIPPA compliant voice message.  ° °Per chart review, patient was provided with outpatient resources. ° °

## 2021-03-10 ENCOUNTER — Ambulatory Visit (INDEPENDENT_AMBULATORY_CARE_PROVIDER_SITE_OTHER): Payer: Self-pay | Admitting: Student

## 2021-03-10 ENCOUNTER — Other Ambulatory Visit: Payer: Self-pay

## 2021-03-10 DIAGNOSIS — E782 Mixed hyperlipidemia: Secondary | ICD-10-CM

## 2021-03-10 DIAGNOSIS — L720 Epidermal cyst: Secondary | ICD-10-CM

## 2021-03-10 DIAGNOSIS — F1920 Other psychoactive substance dependence, uncomplicated: Secondary | ICD-10-CM

## 2021-03-10 DIAGNOSIS — F1120 Opioid dependence, uncomplicated: Secondary | ICD-10-CM

## 2021-03-10 MED ORDER — ATORVASTATIN CALCIUM 40 MG PO TABS
40.0000 mg | ORAL_TABLET | Freq: Every day | ORAL | 11 refills | Status: DC
  Filled 2021-03-10: qty 30, 30d supply, fill #0

## 2021-03-10 MED ORDER — ATORVASTATIN CALCIUM 40 MG PO TABS
40.0000 mg | ORAL_TABLET | Freq: Every day | ORAL | 11 refills | Status: DC
  Filled 2021-03-10: qty 30, 30d supply, fill #0
  Filled 2021-03-15: qty 90, 90d supply, fill #0

## 2021-03-10 NOTE — Patient Instructions (Addendum)
It was a pleasure seeing you in clinic. Today we discussed:   Inclusion cyst: This should improve slowly over time. You may have some thick drainage from the area. Please avoid squeezing, pushing, or otherwise irritating the area.  If you develop redness, warmth, fever please call the clinic to have it looked at for infection If the area become more bothersome we can also discuss sending you to dermatology to have it surgically removed  I have refilled you atorvastatin to the [facility] outpatient pharmacy   Follow up if symptoms become more bothersome or   Thank you, we look forward to helping you remain healthy!

## 2021-03-10 NOTE — Progress Notes (Signed)
° °  CC: boil on back  HPI:  Victor Gomez is a 46 y.o. male with a history who presents for evaluation for boil that has present for about 1 month on his back. Please refer to problem based charting for further details and assessment and plan of current problem and chronic medical conditions.  Past Medical History:  Diagnosis Date   Hepatitis C    Methadone use    clinic each day   Review of Systems:  Negative as per HPI  Physical Exam:  Vitals:   03/10/21 1354  BP: 103/68  Pulse: 74  Temp: 97.9 F (36.6 C)  TempSrc: Oral  SpO2: 100%  Weight: 232 lb 9.6 oz (105.5 kg)  Height: 6' (1.829 m)   Physical Exam Constitutional:      Appearance: Normal appearance.  HENT:     Mouth/Throat:     Mouth: Mucous membranes are moist.     Pharynx: Oropharynx is clear.  Eyes:     Extraocular Movements: Extraocular movements intact.     Conjunctiva/sclera: Conjunctivae normal.     Pupils: Pupils are equal, round, and reactive to light.  Cardiovascular:     Rate and Rhythm: Normal rate and regular rhythm.     Heart sounds: No murmur heard.   No friction rub. No gallop.  Pulmonary:     Effort: Pulmonary effort is normal.     Breath sounds: Normal breath sounds.  Abdominal:     General: Abdomen is flat. Bowel sounds are normal.     Palpations: Abdomen is soft.  Musculoskeletal:        General: Normal range of motion.     Cervical back: Normal range of motion and neck supple.     Right lower leg: No edema.     Left lower leg: No edema.  Skin:    General: Skin is warm and dry.     Capillary Refill: Capillary refill takes less than 2 seconds.     Comments: 5x7cm indurated with small amount of fluctuance of the left back minimal redness, no drainage, no warmth, minimal TTP  Small 0.5 cm hardened mass of the right shoulder   Neurological:     General: No focal deficit present.     Mental Status: He is alert and oriented to person, place, and time.  Psychiatric:        Mood and  Affect: Mood normal.        Behavior: Behavior normal.           Assessment & Plan:   See Encounters Tab for problem based charting.  Patient seen with Dr. Oswaldo Done

## 2021-03-11 DIAGNOSIS — L720 Epidermal cyst: Secondary | ICD-10-CM | POA: Insufficient documentation

## 2021-03-11 NOTE — Progress Notes (Signed)
Internal Medicine Clinic Attending  I saw and evaluated the patient.  I personally confirmed the key portions of the history and exam documented by Dr. Liang and I reviewed pertinent patient test results.  The assessment, diagnosis, and plan were formulated together and I agree with the documentation in the resident's note.  

## 2021-03-11 NOTE — Assessment & Plan Note (Signed)
Patient reports recurrent mass on left back. That has come and gone since his 29s. Most recently was large and swollen around mid December and was draining thick white discharge, reports disharge occasionally more watery. Was treated with 5 days of doxy at [facility]. On exam there is a 5x7 cm indurated mass with central punctum, similar firmer smaller area on the right shoulder. Appearance c/w epidermoid cyst. Cyst is somewhat bothersome to patient due to location and frequent bumping it whie sitting or sleeping. Does not appear infected. Dicussed that his is a benign lesion and that it should resolve slowly with time. He may notice some drainage from the area. Discussed referral to dermatology for removal of the cyst if it become more bothersome. Patient currently uninsured. [employer] card paperwork provided during this visit. Return precautions given if cyst become infected or he develops signs of systemic illness.

## 2021-03-11 NOTE — Assessment & Plan Note (Signed)
Patient reports he started using methamphetamines, cocaine, and heroin in December, and was admitted to [facility], subsequently discharged on 02/05/2021. Subsequently seen at [facility] for substance use and stressors of living at home with mother. Currently on Rispiridone 0.5 mg daily and Hydroxizine as needed. Has follow up with psych on 1/30. Continues to follow with [facility] for methadone.

## 2021-03-14 ENCOUNTER — Ambulatory Visit (INDEPENDENT_AMBULATORY_CARE_PROVIDER_SITE_OTHER): Payer: No Payment, Other | Admitting: Student in an Organized Health Care Education/Training Program

## 2021-03-14 ENCOUNTER — Encounter (HOSPITAL_COMMUNITY): Payer: Self-pay | Admitting: Student in an Organized Health Care Education/Training Program

## 2021-03-14 ENCOUNTER — Other Ambulatory Visit: Payer: Self-pay

## 2021-03-14 VITALS — BP 109/75 | HR 70 | Ht 72.0 in | Wt 233.0 lb

## 2021-03-14 DIAGNOSIS — F988 Other specified behavioral and emotional disorders with onset usually occurring in childhood and adolescence: Secondary | ICD-10-CM

## 2021-03-14 DIAGNOSIS — F19959 Other psychoactive substance use, unspecified with psychoactive substance-induced psychotic disorder, unspecified: Secondary | ICD-10-CM | POA: Diagnosis not present

## 2021-03-14 MED ORDER — HYDROXYZINE PAMOATE 25 MG PO CAPS: 25.0000 mg | ORAL_CAPSULE | Freq: Three times a day (TID) | ORAL | 0 refills | Status: DC | PRN

## 2021-03-14 MED ORDER — ATOMOXETINE HCL 40 MG PO CAPS: 40.0000 mg | ORAL_CAPSULE | Freq: Every day | ORAL | 2 refills | Status: DC

## 2021-03-14 NOTE — Progress Notes (Signed)
Psychiatric Initial Adult Assessment   Patient Identification: Victor Gomez MRN:  [id number] Date of Evaluation:  03/14/2021 Referral Source: [facility] Chief Complaint:   Chief Complaint   New Patient (Initial Visit)    Visit Diagnosis:    ICD-10-CM   1. Attention deficit disorder (ADD) without hyperactivity  F98.8 atomoxetine (STRATTERA) 40 MG capsule    2. Psychoactive substance-induced psychosis (HCC)  F19.959 hydrOXYzine (VISTARIL) 25 MG capsule      History of Present Illness:   Victor Gomez is a 46 year old patient with a past psychiatric history of substance-induced psychosis, polysubstance dependence including opioid type, cocaine use disorder, ADD who presented today with his mother.  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  Patient's interview was conducted with both mother and patient at the request of patient.  Patient reports that his mother helps him remember details better.  Patient and mom report that patient was admitted to inpatient psychiatric hospital in 01/2021 after meth use.  Patient endorses that math is not usually his substance of choice and that this is rare.  Patient reports that he normally abuses opioids when he is abusing substances.  Patient reports that he became very paranoid and was started on Risperdal, trazodone, and Vistaril.  Patient and mom report that since being discharged patient has continued to take his Risperdal daily and Vistaril as needed.  Patient endorses that he did not feel the trazodone was beneficial and has discontinued this medication.  Patient reports that he is living with his mother and they have been having more disagreements lately and that he came to today's assessment at the request of his mother.  Patient reports that he is active at [facility] and is to start IOP next week.  Patient endorses feeling that his mother doubts him frequently and that many of their arguments are due to  this belief.  Patient and mother report that mother does not like a male acquaintance of patient.  Patient endorses that he did have contact with this male as recent as yesterday and led to her money.  Patient endorses in the middle of assessment that he has no intention of continue to contactor as he knows it upsets his mother, but he continues to feel that his mother does not trust him.  Mom reports in assessment that she does not want patient to become "too comfortable."  Mom reports that she has been concerned about patient and his poor focus and mood.  Mom reports the patient does become irritable at times and they are arguing.  Both mom and patient endorse that patient endorses SI when he is upset however, patient endorses that he only says this out of anger and denies ever being truly suicidal.  Mom also endorses that she feels that this is mostly out of anger.  On assessment today patient denies SI, HI and AVH.  Patient endorses that he has been worried that someone has been going through their shed outside due to things being changed around.  Both mom and patient endorse that they do not live in a safe neighborhood and it is not unlikely that someone could be rummaging around.  Patient endorses that he believes at most he could be a "crack head."  Patient does not appear to be truly paranoid and mom endorses patient's paranoia is generally about him being followed specifically.  Patient reports he is sleeping more but does not feel oversedated and if he had a  job to be able to complete the task.  Patient reports that he has been taking his Risperdal in the a.m. but would prefer to keep taking this in the a.m. as he feels he is more compliant.  Patient endorses mild anhedonia but he attributes this mostly to missing his 39 year old daughter as she has been more independent lately.  Patient reports that he has been feeling more hopeless about his unemployment.  Patient denies significant change in  energy.  Patient reports his concentration has been very poor.  Patient endorses that his appetite has been fine lately.  Patient does not appear to endorse any significant anxiety recently but he does report that he had a panic attack prior to his hospitalization in 01/2021 when he was under the influence of meth.  Patient denies any significant history concerning for manic or hypomanic episode.  On assessment today both patient and mom endorse that their biggest concern is patient's lack of ability to concentrate and very poor attention span even when sober.  Mom endorses that patient will be many tasks undone around the house.  Patient also endorses that his lack of attention span makes it difficult for him to remember things. Associated Signs/Symptoms: Depression Symptoms:  anhedonia, hypersomnia, hopelessness, (Hypo) Manic Symptoms:   Denies Anxiety Symptoms:   none recently Psychotic Symptoms:   Denies PTSD Symptoms: NA  Past Psychiatric History: Recent hospitalization in [place], [place] in 01/2021: Discharged on Risperdal 0.5 mg nightly, trazodone 100 mg nightly, Vistaril 25 mg as needed  Prior outpatient: [facility] Childhood diagnoses: ADD, dysgraphia, language disorder Ritalin (failed), Adderall (successful treatment)  Previous Psychotropic Medications: Yes   Substance Abuse History in the last 12 months:  Yes.   Patient endorses sobriety since discharge in 01/2021 Endorses history of opiate use disorder, currently at methadone clinic and [facility] - Reports meth use in 01/2021 - Reports history of heroin and cocaine use - Reports EtOH use disorder, has been sober for years  Consequences of Substance Abuse: Family Consequences:  Significant distrust and discord from family members  Past Medical History:  Past Medical History:  Diagnosis Date   Hepatitis C    Methadone use    clinic each day    Past Surgical History:  Procedure Laterality Date   HERNIA REPAIR      Family  Psychiatric History:  Father: Paranoid schizophrenia, substance use disorder Paternal aunts: Depression Father and 2 paternal aunts-suicide attempts - Maternal grandpa: EtOH use disorder  Family History: No family history on file.  Social History:   Social History   Socioeconomic History   Marital status: Legally Separated    Spouse name: Not on file   Number of children: Not on file   Years of education: Not on file   Highest education level: Not on file  Occupational History   Not on file  Tobacco Use   Smoking status: Every Day    Packs/day: 0.50    Types: Cigarettes   Smokeless tobacco: Never   Tobacco comments:    1 pack every 1-2 days   Vaping Use   Vaping Use: Never used  Substance and Sexual Activity   Alcohol use: No   Drug use: Not Currently    Types: Marijuana, "Crack" cocaine, Methamphetamines    Comment: last time he used 04/2014   Sexual activity: Not on file  Other Topics Concern   Not on file  Social History Narrative   Not on file   Social Determinants of Health  Financial Resource Strain: Not on file  Food Insecurity: Not on file  Transportation Needs: Not on file  Physical Activity: Not on file  Stress: Not on file  Social Connections: Not on file    Additional Social History:  -Lives with mom - Held back 2 times, dropped out in the ninth grade (per mom patient was essentially pushed out of school due to poor performance no significant behavior concerns) - No GED - Has 81 year old daughter who is on good terms with in [place] - Unemployed last 4 years - Failed to get disability - No history of seizures  Allergies:  No Known Allergies  Metabolic Disorder Labs: Lab Results  Component Value Date   HGBA1C 5.6 01/22/2020   No results found for: PROLACTIN Lab Results  Component Value Date   CHOL 310 (H) 01/22/2020   TRIG 528 (H) 01/22/2020   HDL 35 (L) 01/22/2020   CHOLHDL 8.9 (H) 01/22/2020   LDLCALC 169 (H) 01/22/2020    LDLCALC 212 (H) 05/08/2016   No results found for: TSH  Therapeutic Level Labs: No results found for: LITHIUM No results found for: CBMZ No results found for: VALPROATE  Current Medications: Current Outpatient Medications  Medication Sig Dispense Refill   atomoxetine (STRATTERA) 40 MG capsule Take 1 capsule (40 mg total) by mouth daily. 30 capsule 2   atorvastatin (LIPITOR) 40 MG tablet Take 1 tablet (40 mg total) by mouth daily. 30 tablet 11   atorvastatin (LIPITOR) 40 MG tablet Take 1 tablet (40 mg total) by mouth daily. 30 tablet 11   Cod Liver Oil OIL Take by mouth.     D3 SUPER STRENGTH 50 MCG (2000 UT) CAPS Take 1 capsule by mouth daily.     Melatonin 10 MG CAPS Take by mouth.     methadone (DOLOPHINE) 10 MG tablet Take 10 mg by mouth daily.     nicotine polacrilex (NICORETTE) 2 MG gum Take 1 each (2 mg total) by mouth as needed for smoking cessation. 100 tablet 0   Omega-3 Fatty Acids (FISH OIL PO) Take by mouth.     risperiDONE (RISPERDAL) 0.5 MG tablet Take 0.5 mg by mouth at bedtime.     traZODone (DESYREL) 100 MG tablet TAKE 1 TABLET BY MOUTH AT BEDTIME 30 tablet 2   hydrOXYzine (VISTARIL) 25 MG capsule Take 1 capsule (25 mg total) by mouth 3 (three) times daily as needed. 60 capsule 0   No current facility-administered medications for this visit.    Musculoskeletal: Strength & Muscle Tone: within normal limits Gait & Station: normal Patient leans: N/A  Psychiatric Specialty Exam: Review of Systems  Constitutional:  Negative for activity change.  Neurological:  Negative for seizures.  Psychiatric/Behavioral:  Positive for decreased concentration. Negative for behavioral problems, self-injury and sleep disturbance.    Blood pressure 109/75, pulse 70, height 6' (1.829 m), weight 233 lb (105.7 kg).Body mass index is 31.6 kg/m.  General Appearance: Casual  Eye Contact:  Good  Speech:  Clear and Coherent  Volume:  Normal  Mood:  Anxious  Affect:  Appropriate   Thought Process:  Goal Directed  Orientation:  Full (Time, Place, and Person)  Thought Content:  Logical  Suicidal Thoughts:  No  Homicidal Thoughts:  No  Memory:  Immediate;   Fair Remote;   Poor  Judgement:  Fair  Insight:  Present  Psychomotor Activity:  Normal  Concentration:  Concentration: Poor  Recall: [employer] of Knowledge:Poor  Language: Fair  Akathisia:  NA    AIMS (if indicated):  not done  Assets:  Desire for Improvement Housing Resilience  ADL's:  Intact  Cognition: Impaired at baseline, but able to complete assessment  Sleep:  Good   Screenings: AIMS    Flowsheet Row Admission (Discharged) from 02/09/2015 in [facility]  AIMS Total Score 0      AUDIT    Flowsheet Row Admission (Discharged) from 02/09/2015 in [facility]  Alcohol Use Disorder Identification Test Final Score (AUDIT) 29      GAD-7    Flowsheet Row Office Visit from 04/26/2017 in [facility] Office Visit from 04/05/2016 in [facility] Office Visit from 10/11/2015 in [facility] Office Visit from 02/17/2015 in [facility]  Total GAD-7 Score 17 6 7 12       PHQ2-9    [place] Office Visit from 03/10/2021 in [place] Office Visit from 04/21/2020 in [facility] Office Visit from 01/22/2020 in [place] Office Visit from 04/26/2017 in [facility] Office Visit from 04/05/2016 in [facility]  PHQ-2 Total Score 0 2 4 4 2   PHQ-9 Total Score -- 13 17 14 6       Flowsheet Row ED from 02/19/2018 in [facility] ED from 02/17/2018 in [facility]  C-SSRS RISK CATEGORY No Risk No Risk       Assessment and  Plan: Victor Gomez is a 46 year old patient with a history of polysubstance use disorder as well as substance-induced psychosis.  Patient appears to be doing well with Risperdal as he endorses feeling improved.  Patient's mother also endorsed feeling that patient does appear improved on Risperdal.  Patient has continued sobriety while being on Risperdal.  Patient does not appear to be endorsing any residual paranoia while in the medication and despite mother's concerns patient has been compliant.  Based on assessment today patient's biggest concern is his poor concentration which is likely secondary to childhood diagnosis of ADD and what appear to be intellectual disabilities.  For patient to get maximum benefit out of IOP he would benefit from starting Strattera a nonstimulant medication to help with concentration.  This may also help decrease some of the arguments between patient and mom.  This may also assist patient in his quest for employment as he has had very poor focus and has been unable to complete basic hiring forms due to poor concentration.  Substance-induced psychosis Polysubstance dependence including opioid type drug - Continue Risperdal 0.5 mg daily - Start Strattera 40 mg daily - Continue Vistaril 25 mg twice daily as needed - Scheduled appointment for outpatient therapy, separate from mother - F/U 4 weeks  PGY-2 Freida Busman, MD 1/30/20233:28 PM

## 2021-03-15 ENCOUNTER — Other Ambulatory Visit (HOSPITAL_COMMUNITY): Payer: Self-pay

## 2021-03-16 ENCOUNTER — Other Ambulatory Visit: Payer: Self-pay

## 2021-03-17 ENCOUNTER — Other Ambulatory Visit: Payer: Self-pay

## 2021-03-29 ENCOUNTER — Telehealth (HOSPITAL_COMMUNITY): Payer: Self-pay | Admitting: *Deleted

## 2021-03-29 NOTE — Telephone Encounter (Signed)
Pharmacy request for patients Vistaril. He should be out, last rx written on 1/30. Will notify [facility] for refill.

## 2021-03-30 ENCOUNTER — Other Ambulatory Visit (HOSPITAL_COMMUNITY): Payer: Self-pay

## 2021-03-30 ENCOUNTER — Telehealth (HOSPITAL_COMMUNITY): Payer: Self-pay | Admitting: Student in an Organized Health Care Education/Training Program

## 2021-03-30 ENCOUNTER — Other Ambulatory Visit: Payer: Self-pay | Admitting: Student in an Organized Health Care Education/Training Program

## 2021-03-30 DIAGNOSIS — F19959 Other psychoactive substance use, unspecified with psychoactive substance-induced psychotic disorder, unspecified: Secondary | ICD-10-CM

## 2021-03-30 MED ORDER — HYDROXYZINE HCL 25 MG PO TABS: 25.0000 mg | ORAL_TABLET | Freq: Three times a day (TID) | ORAL | 0 refills | Status: DC | PRN

## 2021-03-30 MED ORDER — HYDROXYZINE PAMOATE 25 MG PO CAPS
25.0000 mg | ORAL_CAPSULE | Freq: Three times a day (TID) | ORAL | 0 refills | Status: DC | PRN
  Filled 2021-03-30: qty 60, 20d supply, fill #0

## 2021-03-30 NOTE — Telephone Encounter (Signed)
Patient needs refills on Hydroxyzine, patient has been out of medications since Monday and is having a hard time. Please refill 120 tablets, 3x daily to last 30 days. Pharmacy is [facility] on [address].

## 2021-03-31 ENCOUNTER — Telehealth (HOSPITAL_COMMUNITY): Payer: Self-pay | Admitting: *Deleted

## 2021-03-31 NOTE — Telephone Encounter (Signed)
RX SENT REFILL REQUEST FOR  hydrOXYzine (ATARAX) 25 MG tablet

## 2021-04-18 ENCOUNTER — Other Ambulatory Visit: Payer: Self-pay

## 2021-04-18 ENCOUNTER — Ambulatory Visit (INDEPENDENT_AMBULATORY_CARE_PROVIDER_SITE_OTHER): Payer: No Payment, Other | Admitting: Student in an Organized Health Care Education/Training Program

## 2021-04-18 ENCOUNTER — Encounter (HOSPITAL_COMMUNITY): Payer: Self-pay | Admitting: Student in an Organized Health Care Education/Training Program

## 2021-04-18 VITALS — BP 126/81 | HR 82 | Ht 72.0 in | Wt 244.0 lb

## 2021-04-18 DIAGNOSIS — F988 Other specified behavioral and emotional disorders with onset usually occurring in childhood and adolescence: Secondary | ICD-10-CM | POA: Diagnosis not present

## 2021-04-18 DIAGNOSIS — F17210 Nicotine dependence, cigarettes, uncomplicated: Secondary | ICD-10-CM | POA: Diagnosis not present

## 2021-04-18 MED ORDER — NICOTINE POLACRILEX 4 MG MT GUM: 4.0000 mg | CHEWING_GUM | OROMUCOSAL | 0 refills | Status: DC | PRN

## 2021-04-18 MED ORDER — ATOMOXETINE HCL 40 MG PO CAPS: 80.0000 mg | ORAL_CAPSULE | Freq: Every day | ORAL | 2 refills | Status: DC

## 2021-04-18 NOTE — Progress Notes (Signed)
BH MD/PA/NP OP Progress Note  04/18/2021 4:51 PM Victor Gomez  MRN:  [id number]  Chief Complaint:  Chief Complaint  Patient presents with   Medication Management    FU MM   HPI: Victor Gomez is a 46 year old patient with a past psychiatric history of substance-induced psychosis, polysubstance dependence including opioid type, cocaine use disorder, ADD who presented today with his mother.  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  Patient's interview was conducted with both mother and patient.  Last visit: Patient started on Strattera 40 mg and continued on Risperdal 0.5 mg.   Both patient and mom endorsed that patient concentration remains poor.  Mom reports that she has seen mild improvement as patient has been cleaning a little bit better but she still feels the patient is easily distracted.  Patient reports that his largest concern is that he discontinued taking his Risperdal as he felt that it was causing him to have hyperphasia.  Patient and mom endorse that patient has gained a significant amount of weight and his clothes are tighter.  Patient reports that he discontinued the Risperdal approximately 1 month ago.  Patient reports that he also feels that the Risperdal made him feel "bummed out."  Patient reports that he and mom have been having more arguments at home however, patient endorses that this is because they only have one car and they both have places to be.  Patient continues to attend IOP and endorses that he still has to get them to clean urine samples.  Patient reports that he has not use any other illicit substances beyond THC.  Patient reports he used THC approximately 1.5 weeks ago.  Patient reports that he has been going to visit "Victor Gomez" is male counterpart and buying street gabapentin.  Patient reports that he is using his gabapentin due to neuropathic pain in his leg that he broke a few years ago after a fall off  of a roof.  Patient reports that he has also been concerned that he is having more difficulty urinating and endorses that it feels like "something is blocking the urine and there."  Patient reports that he has never had his prostate checked.  Mom reports that she is concerned that is the Strattera causing the decreased urination and patient reports that he is concerned that it is his cholesterol medication.  Patient reports that beyond his decreased urination he has also been having more generalized chest pains.  Patient describes the pains as sharp and located on his right side and around his right abdomen as well.  Patient reports that he believes this is a sign that he should quit smoking and endorses that he is ready.  Patient and provider have discussion about smoking cessation.  Patient reports that he recalls having a poor reaction to Wellbutrin in the past and he also would prefer not to take medication again.  Patient reports that he would like to try the gum.  On assessment today patient does not endorse SI, HI or AVH.  Patient reports that he continues to go to the methadone clinic daily.  Objectively, patient and patient's mother continue to argue with each other about trivial things including a car located on family property outside of [place].  Patient and patient's mother had to be redirected several times due to their side conversations.  Objectively, patient is very reliant on his mother however he appears to want some independence but due to  his history mother is reluctant to provide him responsibility. Visit Diagnosis:    ICD-10-CM   1. Attention deficit disorder (ADD) without hyperactivity  F98.8 atomoxetine (STRATTERA) 40 MG capsule    2. Cigarette nicotine dependence without complication  F17.210 nicotine polacrilex (NICORETTE) 4 MG gum      Past Psychiatric History: [place] in [place] in 01[place]: Discharged on Risperdal 0.5 mg nightly, trazodone 100 mg  nightly, Vistaril 25 mg as needed   Prior outpatient: [facility] Childhood diagnoses: ADD, dysgraphia, language disorder Ritalin (failed), Adderall (successful treatment)  Past Medical History:  Past Medical History:  Diagnosis Date   Hepatitis C    Methadone use    clinic each day    Past Surgical History:  Procedure Laterality Date   HERNIA REPAIR      Family Psychiatric History: Father: Paranoid schizophrenia, substance use disorder Paternal aunts: Depression Father and 2 paternal aunts-suicide attempts - Maternal grandpa: EtOH use disorder  Family History: No family history on file.  Social History:  Social History   Socioeconomic History   Marital status: Legally Separated    Spouse name: Not on file   Number of children: Not on file   Years of education: Not on file   Highest education level: Not on file  Occupational History   Not on file  Tobacco Use   Smoking status: Every Day    Packs/day: 0.50    Types: Cigarettes   Smokeless tobacco: Never   Tobacco comments:    1 pack every 1-2 days   Vaping Use   Vaping Use: Never used  Substance and Sexual Activity   Alcohol use: No   Drug use: Not Currently    Types: Marijuana, "Crack" cocaine, Methamphetamines    Comment: last time he used 04/2014   Sexual activity: Not on file  Other Topics Concern   Not on file  Social History Narrative   Not on file   Social Determinants of Health   Financial Resource Strain: Not on file  Food Insecurity: Not on file  Transportation Needs: Not on file  Physical Activity: Not on file  Stress: Not on file  Social Connections: Not on file    Allergies: No Known Allergies  Metabolic Disorder Labs: Lab Results  Component Value Date   HGBA1C 5.6 01/22/2020   No results found for: PROLACTIN Lab Results  Component Value Date   CHOL 310 (H) 01/22/2020   TRIG 528 (H) 01/22/2020   HDL 35 (L) 01/22/2020   CHOLHDL 8.9 (H) 01/22/2020   LDLCALC 169 (H) 01/22/2020    LDLCALC 212 (H) 05/08/2016   No results found for: TSH  Therapeutic Level Labs: No results found for: LITHIUM No results found for: VALPROATE No components found for:  CBMZ  Current Medications: Current Outpatient Medications  Medication Sig Dispense Refill   atomoxetine (STRATTERA) 40 MG capsule Take 2 capsules (80 mg total) by mouth daily. 60 capsule 2   atorvastatin (LIPITOR) 40 MG tablet Take 1 tablet (40 mg total) by mouth daily. 30 tablet 11   atorvastatin (LIPITOR) 40 MG tablet Take 1 tablet (40 mg total) by mouth daily. 30 tablet 11   Cod Liver Oil OIL Take by mouth.     D3 SUPER STRENGTH 50 MCG (2000 UT) CAPS Take 1 capsule by mouth daily.     hydrOXYzine (ATARAX) 25 MG tablet Take 1 tablet (25 mg total) by mouth 3 (three) times daily as needed for anxiety. 120 tablet 0   Melatonin  10 MG CAPS Take by mouth.     methadone (DOLOPHINE) 10 MG tablet Take 10 mg by mouth daily.     nicotine polacrilex (NICORETTE) 4 MG gum Take 1 each (4 mg total) by mouth as needed for smoking cessation. 100 tablet 0   Omega-3 Fatty Acids (FISH OIL PO) Take by mouth.     risperiDONE (RISPERDAL) 0.5 MG tablet Take 0.5 mg by mouth at bedtime.     traZODone (DESYREL) 100 MG tablet TAKE 1 TABLET BY MOUTH AT BEDTIME 30 tablet 2   No current facility-administered medications for this visit.     Musculoskeletal: Strength & Muscle Tone: within normal limits Gait & Station: normal Patient leans: N/A  Psychiatric Specialty Exam: Review of Systems  Constitutional:  Positive for unexpected weight change.  Cardiovascular:  Positive for chest pain.  Genitourinary:  Positive for decreased urine volume. Negative for dysuria and hematuria.  Psychiatric/Behavioral:  Negative for dysphoric mood and hallucinations.    Blood pressure 126/81, pulse 82, height 6' (1.829 m), weight 244 lb (110.7 kg).Body mass index is 33.09 kg/m.  General Appearance: Casual  Eye Contact:  Fair  Speech:  Clear and Coherent   Volume:  Normal  Mood:  Euthymic  Affect:  Congruent  Thought Process:  Goal Directed  Orientation:  Full (Time, Place, and Person)  Thought Content: Logical   Suicidal Thoughts:  No  Homicidal Thoughts:  No  Memory:  Immediate;   Fair Recent;   Fair  Judgement:  Other:  Improving  Insight:  Shallow  Psychomotor Activity:  Normal  Concentration:  Concentration: Fair  Recall:  NA  Fund of Knowledge: Poor  Language: Fair  Akathisia:  NA    AIMS (if indicated): not done  Assets:  Communication Skills Desire for Improvement Housing Social Support  ADL's:  Intact  Cognition: Impaired,  Mild appears to be baseline for patient  Sleep:  Fair   Screenings: AIMS    Flowsheet Row [facility]  AIMS Total Score 0      AUDIT    Flowsheet Row [facility]  Alcohol Use Disorder Identification Test Final Score (AUDIT) 29      GAD-7    Flowsheet Row [facility] Visit from [facility]26/2017 in [facility] Office Visit from 04/05/2016 in [facility] Office Visit from 10/11/2015 in [facility] Office Visit from 02/17/2015 in [facility]  Total GAD-7 Score 17 6 7 12       PHQ2-9    Flowsheet Row Office Visit from 03/10/2021 in [facility] Office Visit from 04/21/2020 in [facility] Office Visit from 01/22/2020 in [facility] Office Visit from 04/26/2017 in [facility] Office Visit from 04/05/2016 in [facility]  PHQ-2 Total Score 0 2 4 4 2   PHQ-9 Total Score -- 13 17 14 6       Flowsheet Row ED from [facility]19/2018 [facility] from [facility]17/2018 [facility]  C-SSRS RISK CATEGORY No Risk No Risk        Assessment and Plan: Victor Gomez is a 46 year old patient with a history of polysubstance use disorder as well as substance-induced psychosis.  Since his last appointment patient  discontinued Risperdal on his own.  We will not restart Risperdal as this was likely to treat psychosis secondary to substance use.  Patient is not used cocaine or methamphetamines since his last outpatient visit.  Will increase patient's Strattera as he continues to have poor attention however mom endorses seeing very mild improvement on current dose.  I will also prescribe patient Nicorette gum to assist with nicotine cessation.  Have also taken patient to see Ava Elisabeth Most in order to assist finding a PCP for patient as he is uninsured and no longer has [facility] card.  Patient's physical concerns suggest that he needs a more thorough physical exam soon.  Cannabis use disorder, mild ADD Hx substance-induced psychosis Polysubstance dependence including opioid type drug, in remission - Discontinue Risperdal 0.5 mg daily - Increase Strattera 40 mg daily to 80 mg daily - Continue Vistaril 25 mg twice daily as needed - Scheduled appointment for outpatient therapy, separate from mother - F/U 4 weeks    PGY-2 Bobbye Morton, MD 04/18/2021, 4:51 PM

## 2021-04-22 ENCOUNTER — Telehealth (HOSPITAL_COMMUNITY): Payer: Self-pay | Admitting: *Deleted

## 2021-04-22 NOTE — Telephone Encounter (Signed)
Patient calling to request rx for his hydroxyzine. Reviewed chart and he should have approx two more weeks as he got quantity of 120 on 03/30/21. Checked with his pharmacy and this was confirmed. I will forward request to Damita Dunnings who is in the office on Monday as he will be out before his next appt on 4/17  but he is asking nearly two weeks early. ?

## 2021-04-22 NOTE — Telephone Encounter (Signed)
Mom called and is concerned for the weekend with her son without his hydroxyzine but she did call the pharmacy and find out it was too soon for him to pick it up. Gerlean Ren is here on Monday, will forward concern to her to respond to if she would like to change or call in early his medicine. Also, discussed w mom changing his pharmacy and she is struggling financially and he is uninsured and not working. Will follow up with her on this point tues when I return to work.  ?

## 2021-04-26 ENCOUNTER — Telehealth (HOSPITAL_COMMUNITY): Payer: Self-pay | Admitting: *Deleted

## 2021-04-26 NOTE — Telephone Encounter (Signed)
Called back to patient mom today as I told her I would re the price of his meds other than at a retail pharmacy. Priced it out at [facility] and each rx would be 10.00. THis is a significant savings over what she has been paying for his meds. Will ask Eddie PA to call them in.  ?

## 2021-04-26 NOTE — Telephone Encounter (Signed)
Correction in last note, his provider is [facility] not [place]. ?

## 2021-04-28 ENCOUNTER — Telehealth (HOSPITAL_COMMUNITY): Payer: Self-pay | Admitting: *Deleted

## 2021-04-28 ENCOUNTER — Other Ambulatory Visit: Payer: Self-pay | Admitting: Student in an Organized Health Care Education/Training Program

## 2021-04-28 ENCOUNTER — Other Ambulatory Visit (HOSPITAL_COMMUNITY): Payer: Self-pay

## 2021-04-28 ENCOUNTER — Other Ambulatory Visit (HOSPITAL_COMMUNITY): Payer: Self-pay | Admitting: *Deleted

## 2021-04-28 ENCOUNTER — Other Ambulatory Visit: Payer: Self-pay

## 2021-04-28 ENCOUNTER — Other Ambulatory Visit (HOSPITAL_BASED_OUTPATIENT_CLINIC_OR_DEPARTMENT_OTHER): Payer: Self-pay

## 2021-04-28 MED ORDER — HYDROXYZINE HCL 25 MG PO TABS
25.0000 mg | ORAL_TABLET | Freq: Three times a day (TID) | ORAL | 2 refills | Status: DC | PRN
  Filled 2021-04-28: qty 120, 40d supply, fill #0
  Filled 2021-04-28: qty 90, 30d supply, fill #0

## 2021-04-28 MED ORDER — HYDROXYZINE HCL 25 MG PO TABS: 25.0000 mg | ORAL_TABLET | Freq: Three times a day (TID) | ORAL | 2 refills | Status: DC | PRN

## 2021-04-28 NOTE — Telephone Encounter (Signed)
Mom left me a VM requesting his hydroxyzine be called in to the [facility] as its less expensive than [facility]. Will reach out to his provider to see if she will esign it in, he is a little early with the request. ?

## 2021-04-29 ENCOUNTER — Other Ambulatory Visit: Payer: Self-pay

## 2021-05-03 ENCOUNTER — Encounter (HOSPITAL_COMMUNITY): Payer: Self-pay | Admitting: Emergency Medicine

## 2021-05-03 ENCOUNTER — Emergency Department (HOSPITAL_COMMUNITY)
Admission: EM | Admit: 2021-05-03 | Discharge: 2021-05-03 | Discharge status: Against Medical Advice | Payer: Medicaid Other | Attending: Student | Admitting: Student

## 2021-05-03 ENCOUNTER — Other Ambulatory Visit: Payer: Self-pay

## 2021-05-03 DIAGNOSIS — R079 Chest pain, unspecified: Secondary | ICD-10-CM | POA: Insufficient documentation

## 2021-05-03 DIAGNOSIS — Z5321 Procedure and treatment not carried out due to patient leaving prior to being seen by health care provider: Secondary | ICD-10-CM | POA: Insufficient documentation

## 2021-05-03 DIAGNOSIS — R42 Dizziness and giddiness: Secondary | ICD-10-CM | POA: Insufficient documentation

## 2021-05-03 LAB — CBC
HCT: 49 % (ref 39.0–52.0)
Hemoglobin: 16 g/dL (ref 13.0–17.0)
MCH: 29.1 pg (ref 26.0–34.0)
MCHC: 32.7 g/dL (ref 30.0–36.0)
MCV: 89.1 fL (ref 80.0–100.0)
Platelets: 261 10*3/uL (ref 150–400)
RBC: 5.5 MIL/uL (ref 4.22–5.81)
RDW: 13 % (ref 11.5–15.5)
WBC: 8.5 10*3/uL (ref 4.0–10.5)
nRBC: 0 % (ref 0.0–0.2)

## 2021-05-03 LAB — BASIC METABOLIC PANEL
Anion gap: 12 (ref 5–15)
BUN: 13 mg/dL (ref 6–20)
CO2: 22 mmol/L (ref 22–32)
Calcium: 9.4 mg/dL (ref 8.9–10.3)
Chloride: 101 mmol/L (ref 98–111)
Creatinine, Ser: 0.88 mg/dL (ref 0.61–1.24)
GFR, Estimated: >60 mL/min (ref >60)
Glucose, Bld: 89 mg/dL (ref 70–99)
Potassium: 4.2 mmol/L (ref 3.5–5.1)
Sodium: 135 mmol/L (ref 135–145)

## 2021-05-03 NOTE — ED Notes (Signed)
Called for patient 2 more times no answer. ?

## 2021-05-03 NOTE — ED Triage Notes (Signed)
EMS stated, he was having a dizzy episode. He has had several dizzy spells with chest pain over the past weekend. He was at a alcohol behavior place.  ?

## 2021-05-03 NOTE — ED Provider Triage Note (Signed)
Emergency Medicine Provider Triage Evaluation Note ? ?Victor Gomez , a 46 y.o. male  was evaluated in triage.  Pt complains of lightheadedness this morning.  Says that he has had lightheadedness, dizziness and intermittent chest pains over the weekend however now just lightheaded. ? ?Review of Systems  ?Positive: As above ?Negative: Syncope, pain ? ?Physical Exam  ?BP (!) 136/117 (BP Location: Right Arm)   Pulse 99   Temp 98.3 ?F (36.8 ?C) (Oral)   Resp 16   SpO2 96%  ?Gen:   Awake, no distress   ?Resp:  Normal effort  ?MSK:   Moves extremities without difficulty  ?Other:  RRR, CTA B,  ? ?Medical Decision Making  ?Medically screening exam initiated at 11:26 AM.  Appropriate orders placed.  Victor Gomez was informed that the remainder of the evaluation will be completed by another provider, this initial triage assessment does not replace that evaluation, and the importance of remaining in the ED until their evaluation is complete. ? ? ?  ?Saddie Benders, PA-C ?05/03/21 1127 ? ?

## 2021-05-03 NOTE — ED Notes (Signed)
Call for patient 2 times. ?

## 2021-05-03 NOTE — ED Triage Notes (Signed)
Pt. Stated, last cocaine was 2 months and Marijuana was this past weekend. ?I also have an abscess on the middle of my back but looks like its getting better. ?

## 2021-05-05 ENCOUNTER — Ambulatory Visit (HOSPITAL_COMMUNITY): Payer: No Payment, Other | Admitting: Licensed Clinical Social Worker

## 2021-05-19 ENCOUNTER — Ambulatory Visit (INDEPENDENT_AMBULATORY_CARE_PROVIDER_SITE_OTHER): Payer: No Payment, Other | Admitting: Clinical

## 2021-05-19 DIAGNOSIS — F19959 Other psychoactive substance use, unspecified with psychoactive substance-induced psychotic disorder, unspecified: Secondary | ICD-10-CM | POA: Diagnosis not present

## 2021-05-19 DIAGNOSIS — F902 Attention-deficit hyperactivity disorder, combined type: Secondary | ICD-10-CM

## 2021-05-19 DIAGNOSIS — F819 Developmental disorder of scholastic skills, unspecified: Secondary | ICD-10-CM

## 2021-05-19 DIAGNOSIS — F1121 Opioid dependence, in remission: Secondary | ICD-10-CM

## 2021-05-22 ENCOUNTER — Encounter (HOSPITAL_COMMUNITY): Payer: Self-pay
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
## 2021-05-22 NOTE — Plan of Care (Signed)
Client is in agreement with the plan. ?

## 2021-05-30 ENCOUNTER — Encounter (HOSPITAL_COMMUNITY): Payer: No Payment, Other | Admitting: Student in an Organized Health Care Education/Training Program

## 2021-07-04 ENCOUNTER — Other Ambulatory Visit: Payer: Self-pay

## 2021-07-04 ENCOUNTER — Ambulatory Visit (INDEPENDENT_AMBULATORY_CARE_PROVIDER_SITE_OTHER): Payer: No Payment, Other | Admitting: Student in an Organized Health Care Education/Training Program

## 2021-07-04 ENCOUNTER — Encounter (HOSPITAL_COMMUNITY): Payer: Self-pay | Admitting: Student in an Organized Health Care Education/Training Program

## 2021-07-04 VITALS — BP 130/82 | HR 81 | Ht 72.0 in | Wt 252.0 lb

## 2021-07-04 DIAGNOSIS — F1121 Opioid dependence, in remission: Secondary | ICD-10-CM

## 2021-07-04 DIAGNOSIS — F902 Attention-deficit hyperactivity disorder, combined type: Secondary | ICD-10-CM | POA: Diagnosis not present

## 2021-07-04 MED ORDER — GABAPENTIN 100 MG PO CAPS
200.0000 mg | ORAL_CAPSULE | Freq: Three times a day (TID) | ORAL | 2 refills | Status: DC
  Filled 2021-07-04: qty 90, 15d supply, fill #0

## 2021-07-04 NOTE — Progress Notes (Signed)
BH MD/PA/NP OP Progress Note  07/04/2021 3:53 PM Victor Gomez  MRN:  [id number]  Chief Complaint: Med mgmt  HPI:  Patient presents with his mom, per usual for appts. However mom, reports that she intended for patient to come on his own, but he had car trouble. Patient reports he continues to take methadone. Patient reports that he has stopped taking his Strattera he was concerned because his he had a symptomatic hypertensive episode with chest pain and called the ambulance. Patient reports that this occurred 3.5 weeks ago, and he was concerned that it may have been a panic attack. Patient reports that he had also just started using a Delta- 8 right before the event, and he believes this may have contributed.    Patient reports he has put on a good bit of weight and is craving sugar. Patient reports that he has not used stimulants in a since last Christmas and used CBC last week.   Patient reports he is smoking 1.5ppd. Patient reports he thinks he will attempt to try and stop smoking around his bday.   Patient reports he is sleeping ok but could have better sleep hygiene and is also wants to work on his diet.  Patient denies SI, HI and AVH. Patient reports that he has been trying to stay away from people he used to use with.   Mom's interjections- Mom reports that patient is [facility] and to here back if he can be tested for disability and learning disability.  Mom identifies that patient can be stressed when he see's former "friends."  Visit Diagnosis:    ICD-10-CM   1. ADHD (attention deficit hyperactivity disorder), combined type  F90.2     2. Opioid use disorder, moderate, in early remission (HCC)  F11.21 gabapentin (NEURONTIN) 100 MG capsule    DISCONTINUED: gabapentin (NEURONTIN) 100 MG capsule      Past Psychiatric History:  [place] in [place] in [place]: Discharged on Risperdal 0.5 mg nightly, trazodone 100 mg nightly, Vistaril 25 mg as  needed   Prior outpatient: [facility] diagnoses: ADD, dysgraphia, language disorder Ritalin (failed), Adderall (successful treatment)  04/2021:  Since his last appointment patient discontinued Risperdal on his own.  We will not restart Risperdal as this was likely to treat psychosis secondary to substance use.  Patient is not used cocaine or methamphetamines since his last outpatient visit.  Will increase patient's Strattera as he continues to have poor attention however mom endorses seeing very mild improvement on current dose. Discontinued Risperdal 0.5mg  daily, increased Strattera to 80mg  daily, and continued Vistaril to 25mg  BID PRN  02/2021: Continue Risperdal 0.5 mg daily, start Strattera 40 mg daily, continue Vistaril 25 mg twice daily as needed  Past Medical History:  Past Medical History:  Diagnosis Date   Hepatitis C    Methadone use    clinic each day    Past Surgical History:  Procedure Laterality Date   HERNIA REPAIR      Family Psychiatric History:  Father: Paranoid schizophrenia, substance use disorder Paternal aunts: Depression Father and 2 paternal aunts-suicide attempts - Maternal grandpa: EtOH use disorder    Family History: No family history on file.  Social History:  Social History   Socioeconomic History   Marital status: Legally Separated    Spouse name: Not on file   Number of children: Not on file   Years of education: Not on file   Highest education level: Not on file  Occupational History   Not on file  Tobacco Use   Smoking status: Every Day    Packs/day: 0.50    Types: Cigarettes   Smokeless tobacco: Never   Tobacco comments:    1 pack every 1-2 days   Vaping Use   Vaping Use: Never used  Substance and Sexual Activity   Alcohol use: Yes   Drug use: Not Currently    Types: Marijuana, "Crack" cocaine, Methamphetamines    Comment: last time he used 04/2014   Sexual activity: Not on file  Other Topics Concern   Not on file  Social  History Narrative   Not on file   Social Determinants of Health   Financial Resource Strain: Not on file  Food Insecurity: Not on file  Transportation Needs: Not on file  Physical Activity: Not on file  Stress: Not on file  Social Connections: Not on file    Allergies: No Known Allergies  Metabolic Disorder Labs: Lab Results  Component Value Date   HGBA1C 5.6 01/22/2020   No results found for: PROLACTIN Lab Results  Component Value Date   CHOL 310 (H) 01/22/2020   TRIG 528 (H) 01/22/2020   HDL 35 (L) 01/22/2020   CHOLHDL 8.9 (H) 01/22/2020   LDLCALC 169 (H) 01/22/2020   LDLCALC 212 (H) 05/08/2016   No results found for: TSH  Therapeutic Level Labs: No results found for: LITHIUM No results found for: VALPROATE No components found for:  CBMZ  Current Medications: Current Outpatient Medications  Medication Sig Dispense Refill   atorvastatin (LIPITOR) 40 MG tablet Take 1 tablet (40 mg total) by mouth daily. 30 tablet 11   atorvastatin (LIPITOR) 40 MG tablet Take 1 tablet (40 mg total) by mouth daily. 30 tablet 11   Cod Liver Oil OIL Take by mouth.     D3 SUPER STRENGTH 50 MCG (2000 UT) CAPS Take 1 capsule by mouth daily.     hydrOXYzine (ATARAX) 25 MG tablet Take 1 tablet (25 mg total) by mouth 3 (three) times daily as needed for anxiety. 120 tablet 2   Melatonin 10 MG CAPS Take by mouth.     methadone (DOLOPHINE) 10 MG tablet Take 10 mg by mouth daily.     nicotine polacrilex (NICORETTE) 4 MG gum Take 1 each (4 mg total) by mouth as needed for smoking cessation. 100 tablet 0   Omega-3 Fatty Acids (FISH OIL PO) Take by mouth.     risperiDONE (RISPERDAL) 0.5 MG tablet Take 0.5 mg by mouth at bedtime.     traZODone (DESYREL) 100 MG tablet TAKE 1 TABLET BY MOUTH AT BEDTIME 30 tablet 2   gabapentin (NEURONTIN) 100 MG capsule Take 2 capsules (200 mg total) by mouth 3 (three) times daily. 90 capsule 2   No current facility-administered medications for this visit.      Musculoskeletal: Strength & Muscle Tone: within normal limits Gait & Station: normal Patient leans: N/A  Psychiatric Specialty Exam: Review of Systems  Blood pressure 130/82, pulse 81, height 6' (1.829 m), weight 252 lb (114.3 kg).Body mass index is 34.18 kg/m.  General Appearance: Casual  Eye Contact:  Fair  Speech:  Clear and Coherent  Volume:  Normal  Mood:  Euthymic  Affect:  Restricted but relaxed  Thought Process:  Goal Directed  Orientation:  Full (Time, Place, and Person)  Thought Content: Preoccupations  Suicidal Thoughts:  No  Homicidal Thoughts:  No  Memory:  Immediate;   Fair  Judgement:  Fair  Insight:  Fair  Psychomotor Activity:  Normal  Concentration:  Concentration: Good  Recall:  NA  Fund of Knowledge: Fair  Language: Fair  Akathisia:  NA    AIMS (if indicated): not done  Assets:  IT sales professional Social Support  ADL's:  Intact  Cognition: WNL  Sleep:  Fair   Screenings: AIMS    Flowsheet Row [facility]  AIMS Total Score 0      AUDIT    Flowsheet Row [facility]  Alcohol Use Disorder Identification Test Final Score (AUDIT) 29      GAD-7    Flowsheet Row Office Visit from 04/26/2017 in [facility] Office Visit from 04/05/2016 in [facility] Office Visit from 10/11/2015 in [facility] Office Visit from 02/17/2015 in [facility]  Total GAD-7 Score PHQ2-9    Flowsheet Row Counselor from 05/19/2021 in [facility] Office Visit from 03/10/2021 in [facility] Office Visit from 04/21/2020 in [facility] Office Visit from 01/22/2020 in [facility] Office Visit from 04/26/2017 in [facility]  PHQ-2 Total Score 2 0 PHQ-9 Total Score 8 -- Flowsheet Row Counselor from 05/19/2021 in [facility] ED from 02/19/2018 in [facility] from 02/17/2018 in [facility]  C-SSRS RISK CATEGORY No Risk No Risk No Risk        Assessment and Plan:  Appears to be doing well, mom was not reporting episodes of aggression or endorsing that she is significantly irritated by patient.  Patient not been taking his Strattera, regardless his mood appears to be more stable.  Patient does endorse significant cravings for sugar and has put on a noticeable amount of weight.  Patient is gaining 8 pounds since his last visit.  Patient also is concerned by his weight gain and how this will effect his overall health.  Patient's sugar cravings may be related to patient continuing his methadone and illicit opioid cessation.  Patient describes craving sugar and also endorses some intermittent anxiety.  Patient would benefit from gabapentin for both symptoms.  Patient does not currently in a place where he is ready to stop smoking but is in the precontemplative stage. Notably patient and mom are not arguing as much and patient is more calm today.During assessment today patient does appear to be preoccupied with culturally appropriate preoccupations with [place] and illicit substances.  Patient was recommended to decrease amount of time he spends searching and watching news around the world.  There is some concern that due to patient's reported limited cognitive baseline he may not be able to fully conceptualize and handle the news that he sees and reads.  Would like to decrease chances of patient becoming overly obsessed with where his medications come from and could possibly lead to destabilization with poor compliance and  reluctance to take medications.  Interestingly, patient and mother appear to have settled their concerns regarding patient getting himself around town.  Opioid use disorder, remission ADHD - Discontinue Strattera 80 mg, patient no longer taking appears to be stable -  Start gabapentin 200 mg 3 times daily, for anxiety and cravings for sugar - F/U patient in 4 weeks  PGY-2 Bobbye Morton, MD 07/04/2021, 3:53 PM

## 2021-07-05 ENCOUNTER — Other Ambulatory Visit: Payer: Self-pay

## 2021-07-26 ENCOUNTER — Other Ambulatory Visit: Payer: Self-pay

## 2021-07-26 ENCOUNTER — Ambulatory Visit: Payer: Self-pay | Admitting: Internal Medicine

## 2021-07-26 ENCOUNTER — Encounter: Payer: Self-pay | Admitting: Internal Medicine

## 2021-07-26 VITALS — BP 128/77 | HR 70 | Temp 97.9°F | Ht 72.0 in | Wt 265.3 lb

## 2021-07-26 DIAGNOSIS — E878 Other disorders of electrolyte and fluid balance, not elsewhere classified: Secondary | ICD-10-CM

## 2021-07-26 DIAGNOSIS — R600 Localized edema: Secondary | ICD-10-CM | POA: Insufficient documentation

## 2021-07-26 LAB — POCT URINALYSIS DIPSTICK
Bilirubin, UA: NEGATIVE
Blood, UA: NEGATIVE
Glucose, UA: NEGATIVE
Ketones, UA: NEGATIVE
Nitrite, UA: NEGATIVE
Protein, UA: NEGATIVE
Spec Grav, UA: 1.02 (ref 1.010–1.025)
Urobilinogen, UA: 0.2 E.U./dL
pH, UA: 7 (ref 5.0–8.0)

## 2021-07-26 LAB — BRAIN NATRIURETIC PEPTIDE: B Natriuretic Peptide: 72.1 pg/mL (ref 0.0–100.0)

## 2021-07-26 NOTE — Assessment & Plan Note (Addendum)
Acute visit for improving bilateral lower extremity swelling x 1 week. No recent travel. Denies SHOB, CP, pleuritic chest pain, DOE, or orthopnea. CV exam reassuring with no murmurs appreciated. Lung exam reassuring with LCTAB. Exam with 1+ lower extremity swelling up to mid-shins bilaterally; no associated overlying skin changes. He denies standing on his feet much, though suspect some component of venous insufficiency may be playing a role given improvement with leg elevation. Wells score 0, no concern for DVT at this time. CMP with normal albumin ~2 year ago. Previously alcohol use, but denies using for quite some time. Low suspicion for liver related pathology at this time. Overall, suspect this may be related to increased salt intake given pt and mother reports ongoing dietary indiscretion with meals consisting of high salt and sugar, and increased weight d/t poor diet.   Plan to obtain CMP, A1c, TSH, BNP, and urine protein, limited by pt's self pay statust. They are in the process of filing for orange card. Urine dipstick without protein. Will obtain BNP and CMP for now at this time and consider TSH and A1c if not improving. Advised to follow a low sodium diet and to follow up if sxs not improving.   Addendum: CMP completeley unremarkable with albumin of 4.4. BNP unremarkable at 72. Overall, do not suspect cardiac, pulmonary, or hepatic in nature. Do not suspect DVT as well. Likely to be 2/2 salt overload given poor diet consisting of frozen meals, cakes, and processed foods. Will continue with conservative management at this time, and ensure improvement/resolution during follow up.

## 2021-07-26 NOTE — Patient Instructions (Signed)
I will call with lab results  Please continue with low salt diet

## 2021-07-26 NOTE — Progress Notes (Signed)
CC: acute visit for lower extremity swelling   HPI:  [name] is a 46 y.o. male with a PMHx stated below and presents today for stated above. Please see the Encounters tab for problem-based Assessment & Plan for additional details.   Past Medical History:  Diagnosis Date   Hepatitis C    Methadone use    clinic each day    Current Outpatient Medications on File Prior to Visit  Medication Sig Dispense Refill   atorvastatin (LIPITOR) 40 MG tablet Take 1 tablet (40 mg total) by mouth daily. 30 tablet 11   atorvastatin (LIPITOR) 40 MG tablet Take 1 tablet (40 mg total) by mouth daily. 30 tablet 11   Cod Liver Oil OIL Take by mouth.     D3 SUPER STRENGTH 50 MCG (2000 UT) CAPS Take 1 capsule by mouth daily.     gabapentin (NEURONTIN) 100 MG capsule Take 2 capsules (200 mg total) by mouth 3 (three) times daily. 90 capsule 2   hydrOXYzine (ATARAX) 25 MG tablet Take 1 tablet (25 mg total) by mouth 3 (three) times daily as needed for anxiety. 120 tablet 2   Melatonin 10 MG CAPS Take by mouth.     methadone (DOLOPHINE) 10 MG tablet Take 10 mg by mouth daily.     nicotine polacrilex (NICORETTE) 4 MG gum Take 1 each (4 mg total) by mouth as needed for smoking cessation. 100 tablet 0   Omega-3 Fatty Acids (FISH OIL PO) Take by mouth.     risperiDONE (RISPERDAL) 0.5 MG tablet Take 0.5 mg by mouth at bedtime.     traZODone (DESYREL) 100 MG tablet TAKE 1 TABLET BY MOUTH AT BEDTIME 30 tablet 2   No current facility-administered medications on file prior to visit.    No family history on file.  Social History   Socioeconomic History   Marital status: Legally Separated    Spouse name: Not on file   Number of children: Not on file   Years of education: Not on file   Highest education level: Not on file  Occupational History   Not on file  Tobacco Use   Smoking status: Every Day    Packs/day: 0.50    Types: Cigarettes   Smokeless tobacco: Never   Tobacco comments:    1 pack  every 1-2 days   Vaping Use   Vaping Use: Never used  Substance and Sexual Activity   Alcohol use: Yes   Drug use: Not Currently    Types: Marijuana, "Crack" cocaine, Methamphetamines    Comment: last time he used 04/2014   Sexual activity: Not on file  Other Topics Concern   Not on file  Social History Narrative   Not on file   Social Determinants of Health   Financial Resource Strain: Not on file  Food Insecurity: Not on file  Transportation Needs: Not on file  Physical Activity: Not on file  Stress: Not on file  Social Connections: Not on file  Intimate Partner Violence: Not on file    Review of Systems: ROS negative except for what is noted on the assessment and plan.  Vitals:   07/26/21 1551  BP: 128/77  Pulse: 70  Temp: 97.9 F (36.6 C)  TempSrc: Oral  SpO2: 96%  Weight: 265 lb 4.8 oz (120.3 kg)  Height: 6' (1.829 m)    Physical Exam: Constitutional: alert, well-appearing, in NAD HENT: normocephalic, atraumatic, mucous membranes moist Eyes: conjunctiva non-erythematous, EOMI Neck: no palpable thyromegaly  Cardiovascular: RRR, no m/r/g, 1+ bilateral LE pitting edema  Pulmonary/Chest: normal work of breathing on RA, LCTAB Abdominal: soft, non-tender to palpation, non-distended MSK: normal bulk and tone  Skin: no chronic feet or leg skin changes  Neurological: A&O x 3 and follows commands   Assessment & Plan:   See Encounters Tab for problem based charting.  Patient discussed with Dr. Delrae Alfred, MD  Internal Medicine Resident, PGY-1 [facility]

## 2021-07-26 NOTE — Progress Notes (Deleted)
CC: acute visit for swollen legs   HPI:  [name] is a 46 y.o. male with a PMHx stated below and presents today for stated above. Please see the Encounters tab for problem-based Assessment & Plan for additional details.   Past Medical History:  Diagnosis Date   Hepatitis C    Methadone use    clinic each day    Current Outpatient Medications on File Prior to Visit  Medication Sig Dispense Refill   atorvastatin (LIPITOR) 40 MG tablet Take 1 tablet (40 mg total) by mouth daily. 30 tablet 11   atorvastatin (LIPITOR) 40 MG tablet Take 1 tablet (40 mg total) by mouth daily. 30 tablet 11   Cod Liver Oil OIL Take by mouth.     D3 SUPER STRENGTH 50 MCG (2000 UT) CAPS Take 1 capsule by mouth daily.     gabapentin (NEURONTIN) 100 MG capsule Take 2 capsules (200 mg total) by mouth 3 (three) times daily. 90 capsule 2   hydrOXYzine (ATARAX) 25 MG tablet Take 1 tablet (25 mg total) by mouth 3 (three) times daily as needed for anxiety. 120 tablet 2   Melatonin 10 MG CAPS Take by mouth.     methadone (DOLOPHINE) 10 MG tablet Take 10 mg by mouth daily.     nicotine polacrilex (NICORETTE) 4 MG gum Take 1 each (4 mg total) by mouth as needed for smoking cessation. 100 tablet 0   Omega-3 Fatty Acids (FISH OIL PO) Take by mouth.     risperiDONE (RISPERDAL) 0.5 MG tablet Take 0.5 mg by mouth at bedtime.     traZODone (DESYREL) 100 MG tablet TAKE 1 TABLET BY MOUTH AT BEDTIME 30 tablet 2   No current facility-administered medications on file prior to visit.    No family history on file.  Social History   Socioeconomic History   Marital status: Legally Separated    Spouse name: Not on file   Number of children: Not on file   Years of education: Not on file   Highest education level: Not on file  Occupational History   Not on file  Tobacco Use   Smoking status: Every Day    Packs/day: 0.50    Types: Cigarettes   Smokeless tobacco: Never   Tobacco comments:    1 pack every 1-2 days    Vaping Use   Vaping Use: Never used  Substance and Sexual Activity   Alcohol use: Yes   Drug use: Not Currently    Types: Marijuana, "Crack" cocaine, Methamphetamines    Comment: last time he used 04/2014   Sexual activity: Not on file  Other Topics Concern   Not on file  Social History Narrative   Not on file   Social Determinants of Health   Financial Resource Strain: Not on file  Food Insecurity: Not on file  Transportation Needs: Not on file  Physical Activity: Not on file  Stress: Not on file  Social Connections: Not on file  Intimate Partner Violence: Not on file    Review of Systems: ROS negative except for what is noted on the assessment and plan.  There were no vitals filed for this visit.   Physical Exam: Constitutional: alert, well-appearing, in NAD HENT: normocephalic, atraumatic, mucous membranes moist Eyes: conjunctiva non-erythematous, EOMI Neck: no palpable thyromegaly *** Cardiovascular: RRR, no m/r/g, non-edematous bilateral LE Pulmonary/Chest: normal work of breathing on RA, LCTAB Abdominal: soft, non-tender to palpation, non-distended MSK: normal bulk and tone . Skin: warm  and dry  Extremities: *** Neurological: A&O x 3 and follows commands  Psych: normal behavior, normal affect    Assessment & Plan:   See Encounters Tab for problem based charting.  Patient discussed with Dr. [id number]  Lajean Manes, MD  Internal Medicine Resident, PGY-1 [facility]

## 2021-07-27 LAB — CMP14 + ANION GAP
ALT: 11 IU/L (ref 0–44)
AST: 23 IU/L (ref 0–40)
Albumin/Globulin Ratio: 1.8 (ref 1.2–2.2)
Albumin: 4.4 g/dL (ref 4.0–5.0)
Alkaline Phosphatase: 88 IU/L (ref 44–121)
Anion Gap: 16 mmol/L (ref 10.0–18.0)
BUN/Creatinine Ratio: 11 (ref 9–20)
BUN: 10 mg/dL (ref 6–24)
Bilirubin Total: <0.2 mg/dL (ref 0.0–1.2)
CO2: 25 mmol/L (ref 20–29)
Calcium: 9.2 mg/dL (ref 8.7–10.2)
Chloride: 103 mmol/L (ref 96–106)
Creatinine, Ser: 0.94 mg/dL (ref 0.76–1.27)
Globulin, Total: 2.5 g/dL (ref 1.5–4.5)
Glucose: 80 mg/dL (ref 70–99)
Potassium: 4.7 mmol/L (ref 3.5–5.2)
Sodium: 144 mmol/L (ref 134–144)
Total Protein: 6.9 g/dL (ref 6.0–8.5)
eGFR: 103 mL/min/{1.73_m2} (ref >59)

## 2021-07-27 NOTE — Progress Notes (Signed)
Internal Medicine Clinic Attending  Case discussed with Dr. Patel  At the time of the visit.  We reviewed the resident's history and exam and pertinent patient test results.  I agree with the assessment, diagnosis, and plan of care documented in the resident's note.  

## 2021-08-01 ENCOUNTER — Encounter (HOSPITAL_COMMUNITY): Payer: Self-pay | Admitting: Student in an Organized Health Care Education/Training Program

## 2021-08-01 ENCOUNTER — Other Ambulatory Visit: Payer: Self-pay

## 2021-08-01 ENCOUNTER — Ambulatory Visit (INDEPENDENT_AMBULATORY_CARE_PROVIDER_SITE_OTHER): Payer: No Payment, Other | Admitting: Student in an Organized Health Care Education/Training Program

## 2021-08-01 VITALS — BP 117/78 | HR 77 | Ht 72.0 in | Wt 250.0 lb

## 2021-08-01 DIAGNOSIS — F1121 Opioid dependence, in remission: Secondary | ICD-10-CM

## 2021-08-01 DIAGNOSIS — F17210 Nicotine dependence, cigarettes, uncomplicated: Secondary | ICD-10-CM | POA: Diagnosis not present

## 2021-08-01 MED ORDER — GABAPENTIN 100 MG PO CAPS
200.0000 mg | ORAL_CAPSULE | Freq: Three times a day (TID) | ORAL | 2 refills | Status: DC
  Filled 2021-08-01: qty 90, 15d supply, fill #0

## 2021-08-01 MED ORDER — NICOTINE 21 MG/24HR TD PT24
21.0000 mg | MEDICATED_PATCH | Freq: Every day | TRANSDERMAL | 0 refills | Status: DC
  Filled 2021-08-01: qty 28, 28d supply, fill #0

## 2021-08-01 NOTE — Progress Notes (Signed)
BH MD/PA/NP OP Progress Note  08/01/2021 2:30 PM Victor Gomez  MRN:  [id number]  Chief Complaint: No chief complaint on file. Med mgmt  HPI:  Patient presents on his own for the first time, while his mother waits in the waiting room. Patient reports that he is still struggling to quit smoking and lose weight. Patient reports that he is currently down get down to 1 ppd whereas 1 mon ago he was at 2ppd. Patient reports that he started quitting smoking earlier than he initially reported because he was tired of coughing. Patient reports that he has been able to cut down on soda and ice cream and drinking more water. Patient reports that he is also outside working. Patient reports that he is also trying to cut back on the quantity and quality of what he is eating.   Patient reports that he is keeping busy cleaning. Patient reports he is trying to help his mom clean out her ex- boyfriends things and caring for his mom in general. Patient reports that his mood has been "pretty good." Patient reports that overall he is getting along better with his mom. Patient reports he was also been able to go his daughter's high school graduation. Patient reports that he is able to sleep ok.   Patient reports he goes to [facility] in the [facility] for [facility]adone, works around the house. Patient reports he would like to get a part time job in the future, but he is afraid that he may get around the wrong crowd and relapse. Patient also reports that he is a bit afraid of becoming overly anxious and stressed. Patient reports he may try to go get his GED. Patient endorses that he feels a GED would be beneficial to not having to labor work that has caused him harm in the past.   Patient denies SI, HI, and AVH.    Visit Diagnosis: No diagnosis found.  Past Psychiatric History: Recent hospitalization in [place], [place] in 01/2021: Discharged on Risperdal 0.5 mg nightly, trazodone 100 mg nightly, Vistaril 25 mg as needed   Prior  outpatient: [facility]hood diagnoses: ADD, dysgraphia, language disorder Ritalin (failed), Adderall (successful treatment)  06/2021: Strattera discontinued due to not requiring for stability, Gabapentin was started 200mg  TID, for anxiety and sugar cravings    04/2021:  Since his last appointment patient discontinued Risperdal on his own.  We will not restart Risperdal as this was likely to treat psychosis secondary to substance use.  Patient is not used cocaine or methamphetamines since his last outpatient visit.  Will increase patient's Strattera as he continues to have poor attention however mom endorses seeing very mild improvement on current dose. Discontinued Risperdal 0.5mg  daily, increased Strattera to 80mg  daily, and continued Vistaril to 25mg  BID PRN   02/2021: Continue Risperdal 0.5 mg daily, start Strattera 40 mg daily, continue Vistaril 25 mg twice daily as needed    Past Medical History:  Past Medical History:  Diagnosis Date   Hepatitis C    Methadone use    clinic each day    Past Surgical History:  Procedure Laterality Date   HERNIA REPAIR      Family Psychiatric History:  Father: Paranoid schizophrenia, substance use disorder Paternal aunts: Depression Father and 2 paternal aunts-suicide attempts - Maternal grandpa: EtOH use disorder    Family History: No family history on file.  Social History:  Social History   Socioeconomic History   Marital status: Legally Separated    Spouse name:  Not on file   Number of children: Not on file   Years of education: Not on file   Highest education level: Not on file  Occupational History   Not on file  Tobacco Use   Smoking status: Every Day    Packs/day: 0.50    Types: Cigarettes   Smokeless tobacco: Never   Tobacco comments:    1 pack every 1-2 days   Vaping Use   Vaping Use: Never used  Substance and Sexual Activity   Alcohol use: Yes   Drug use: Not Currently    Types: Marijuana, "Crack" cocaine,  Methamphetamines    Comment: last time he used 04/2014   Sexual activity: Not on file  Other Topics Concern   Not on file  Social History Narrative   Not on file   Social Determinants of Health   Financial Resource Strain: Not on file  Food Insecurity: Not on file  Transportation Needs: Not on file  Physical Activity: Not on file  Stress: Not on file  Social Connections: Not on file    Allergies: No Known Allergies  Metabolic Disorder Labs: Lab Results  Component Value Date   HGBA1C 5.6 01/22/2020   No results found for: "PROLACTIN" Lab Results  Component Value Date   CHOL 310 (H) 01/22/2020   TRIG 528 (H) 01/22/2020   HDL 35 (L) 01/22/2020   CHOLHDL 8.9 (H) 01/22/2020   LDLCALC 169 (H) 01/22/2020   LDLCALC 212 (H) 05/08/2016   No results found for: "TSH"  Therapeutic Level Labs: No results found for: "LITHIUM" No results found for: "VALPROATE" No results found for: "CBMZ"  Current Medications: Current Outpatient Medications  Medication Sig Dispense Refill   atorvastatin (LIPITOR) 40 MG tablet Take 1 tablet (40 mg total) by mouth daily. 30 tablet 11   atorvastatin (LIPITOR) 40 MG tablet Take 1 tablet (40 mg total) by mouth daily. 30 tablet 11   Cod Liver Oil OIL Take by mouth.     D3 SUPER STRENGTH 50 MCG (2000 UT) CAPS Take 1 capsule by mouth daily.     gabapentin (NEURONTIN) 100 MG capsule Take 2 capsules (200 mg total) by mouth 3 (three) times daily. 90 capsule 2   hydrOXYzine (ATARAX) 25 MG tablet Take 1 tablet (25 mg total) by mouth 3 (three) times daily as needed for anxiety. 120 tablet 2   Melatonin 10 MG CAPS Take by mouth.     methadone (DOLOPHINE) 10 MG tablet Take 10 mg by mouth daily.     nicotine polacrilex (NICORETTE) 4 MG gum Take 1 each (4 mg total) by mouth as needed for smoking cessation. 100 tablet 0   Omega-3 Fatty Acids (FISH OIL PO) Take by mouth.     risperiDONE (RISPERDAL) 0.5 MG tablet Take 0.5 mg by mouth at bedtime.     traZODone  (DESYREL) 100 MG tablet TAKE 1 TABLET BY MOUTH AT BEDTIME 30 tablet 2   No current facility-administered medications for this visit.     Musculoskeletal: Strength & Muscle Tone: within normal limits Gait & Station: normal Patient leans: N/A  Psychiatric Specialty Exam: Review of Systems  There were no vitals taken for this visit.There is no height or weight on file to calculate BMI.  General Appearance: Casual  Eye Contact:  Fair  Speech:  Clear and Coherent  Volume:  Normal  Mood:  Euthymic  Affect:  Appropriate and Congruent  Thought Process:  Coherent  Orientation:  Full (Time, Place, and Person)  Thought Content: Logical   Suicidal Thoughts:  No  Homicidal Thoughts:  No  Memory:  Immediate;   Good Recent;   Good  Judgement:  Fair  Insight:  Shallow  Psychomotor Activity:  Normal  Concentration:  Concentration: Good  Recall:  NA  Fund of Knowledge: Good  Language: Good  Akathisia:  NA    AIMS (if indicated): not done  Assets:  Communication Skills Desire for Improvement Housing Resilience Social Support  ADL's:  Intact  Cognition: WNL  Sleep:  Fair   Screenings: AIMS    Flowsheet Row [facility]  AIMS Total Score 0      AUDIT    Flowsheet Row [facility]  Alcohol Use Disorder Identification Test Final Score (AUDIT) 29      GAD-7    Flowsheet Row Office Visit from 04/26/2017 in [facility] Office Visit from 04/05/2016 in [facility] Office Visit from 10/11/2015 in [facility] Office Visit from 02/17/2015 in [facility]  Total GAD-7 Score 17 6 7 12       PHQ2-9    Flowsheet Row Office Visit from 07/26/2021 in [facility] Counselor from 05/19/2021 in [facility] Office Visit from 03/10/2021 in [facility] Office Visit from 04/21/2020 in [facility] Office Visit from 01/22/2020 in [facility]  PHQ-2 Total Score 0 2 0 2 4  PHQ-9 Total Score -- 8 -- 13 17      Flowsheet Row Counselor from 05/19/2021 in [facility] ED from 02[facility]19/2018 in [facility] from 02/17/2018 in [facility]  C-SSRS RISK CATEGORY No Risk No Risk No Risk        Assessment and Plan:   Assessment today patient appears to be improving with gabapentin.  Patient is now making efforts to stop smoking and lose weight.  Patient spends most of assessment talking about the new changes in his life to accomplish these goals.  Patient also appears hopeful and endorses that he is no longer feeling the urge to continue to argue with his mother.  Tobacco use disorder, severe - Nicotine patch 21 mg - Patient may also consider over-the-counter Nicorette lozenges  Opioid use disorder, remission - Continue gabapentin 200 mg 3 times daily, for anxiety and cravings for sugar 2/2 patient no longer using substances  Collaboration of Care: Collaboration of Care: Other None  Patient/Guardian was advised Release of Information must be obtained prior to any record release in order to collaborate their care with an outside provider. Patient/Guardian was advised if they have not already done so to contact the registration department to sign all necessary forms in order for 04/18/2018 to release information regarding their care.   Consent: Patient/Guardian gives verbal consent for treatment and assignment of benefits for services provided during this visit. Patient/Guardian expressed understanding and agreed to proceed.   PGY-2 [place], MD 08/01/2021, 2:30 PM

## 2021-08-02 ENCOUNTER — Other Ambulatory Visit: Payer: Self-pay

## 2021-08-05 ENCOUNTER — Ambulatory Visit (HOSPITAL_COMMUNITY): Payer: No Payment, Other | Admitting: Clinical

## 2021-08-25 ENCOUNTER — Ambulatory Visit (INDEPENDENT_AMBULATORY_CARE_PROVIDER_SITE_OTHER): Payer: No Payment, Other | Admitting: Clinical

## 2021-08-25 DIAGNOSIS — F902 Attention-deficit hyperactivity disorder, combined type: Secondary | ICD-10-CM | POA: Diagnosis not present

## 2021-08-25 NOTE — Plan of Care (Signed)
  Problem: Depression CCP Problem  1  Goal: LTG: Gavin WILL SCORE LESS THAN 10 ON THE PATIENT HEALTH QUESTIONNAIRE (PHQ-9) Outcome: Progressing Goal: STG: Montell WILL PARTICIPATE IN AT LEAST 80% OF SCHEDULED INDIVIDUAL PSYCHOTHERAPY SESSIONS Outcome: Progressing Goal: STG: Eros WILL COMPLETE AT LEAST 80% OF ASSIGNED HOMEWORK Outcome: Progressing   Problem: Anxiety Disorder CCP Problem  1  Goal: LTG: Patient will score less than 5 on the Generalized Anxiety Disorder 7 Scale (GAD-7) Outcome: Progressing Goal: STG: Patient will participate in at least 80% of scheduled individual psychotherapy sessions Outcome: Progressing   

## 2021-08-25 NOTE — Progress Notes (Signed)
   THERAPIST PROGRESS NOTE  Session Time: 40 minutes  Participation Level: Active  Behavioral Response: CasualAlertEuthymic  Type of Therapy: Individual Therapy  Treatment Goals addressed: Client will participate in 80% of scheduled individual psychotherapy sessions  ProgressTowards Goals: Progressing  Interventions: CBT and Supportive  Summary:  Victor Gomez is a 46 y.o. male who presents for the scheduled session oriented times five, appropriately dressed, and friendly. Client denied hallucinations and delusions. Client reported he has been managing fairly well. Client reported he attended SAI[facility] at [facility] this morning. Client reported he likes going to group because it helps to keep him accountable. Client reported the methadone has been very helpful in also keeping him from drinking alcohol. Client reported he has been keeping himself occupied otherwise by doing house work. Client reported he has been progressively organizing and cleaning things. Client reported he gets overwhelmed because his ADHD makes it hard for him to focus, finish a task, and easily distracted. Clients mother reported he struggles terribly. Client reported he has intermittent days of depression when he can't do daily tasks or self care/ hygiene activity and feels like he isn't getting anywhere. Client reported he keeps to himself so he does not get distracted and get connect with the wrong crowd. Client reported he has been working on building trust back with his mother. Client reported he feels blessed his daughter who just graduated high school is a part of his life. Client reported otherwise his medication is working well especially the gabapentin.  Evidence of progress towards goal:  client reported mediation compliance 7 days a week. Client reported 1 skill of behavioral activation during the week to help create routine.   Suicidal/Homicidal: Nowithout intent/plan  Therapist Response:  Therapist began the  appointment asking the client how he has been doing since last seen. Therapist used CBT to engage using active listening and positive emotional support. Therapist used CBT to engage and ask the client to discuss medication compliance and effectiveness. Therapist used CBT to engage and ask the client to describe severity of his symptoms and daily challenges in functioning.  Therapist used CBT to discuss organization and focus skills as well as challenging negative thoughts.  Therapist used CBT ask the client to identify his progress with frequency of use with coping skills with continued practice in his daily activity.    Client was scheduled for next appointment.   Plan: Return again in 5 weeks.  Diagnosis: ADHD, combined type  Collaboration of Care: Other none.  Patient/Guardian was advised Release of Information must be obtained prior to any record release in order to collaborate their care with an outside provider. Patient/Guardian was advised if they have not already done so to contact the registration department to sign all necessary forms in order for [place] to release information regarding their care.   Consent: Patient/Guardian gives verbal consent for treatment and assignment of benefits for services provided during this visit. Patient/Guardian expressed understanding and agreed to proceed.   Neena Rhymes Kara Mierzejewski, LCSW 08/25/2021

## 2021-08-27 NOTE — Plan of Care (Signed)
  Problem: Depression CCP Problem  1  Goal: LTG: Reymundo WILL SCORE LESS THAN 10 ON THE PATIENT HEALTH QUESTIONNAIRE (PHQ-9) Outcome: Progressing Goal: STG: Alvy WILL PARTICIPATE IN AT LEAST 80% OF SCHEDULED INDIVIDUAL PSYCHOTHERAPY SESSIONS Outcome: Progressing Goal: STG: Xaviar WILL COMPLETE AT LEAST 80% OF ASSIGNED HOMEWORK Outcome: Progressing   Problem: Anxiety Disorder CCP Problem  1  Goal: LTG: Patient will score less than 5 on the Generalized Anxiety Disorder 7 Scale (GAD-7) Outcome: Progressing Goal: STG: Patient will participate in at least 80% of scheduled individual psychotherapy sessions Outcome: Progressing   

## 2021-08-29 ENCOUNTER — Other Ambulatory Visit: Payer: Self-pay

## 2021-08-29 ENCOUNTER — Encounter (HOSPITAL_COMMUNITY): Payer: Self-pay | Admitting: Student in an Organized Health Care Education/Training Program

## 2021-08-29 ENCOUNTER — Ambulatory Visit (INDEPENDENT_AMBULATORY_CARE_PROVIDER_SITE_OTHER): Payer: No Payment, Other | Admitting: Student in an Organized Health Care Education/Training Program

## 2021-08-29 VITALS — BP 140/96 | HR 92 | Wt 242.0 lb

## 2021-08-29 DIAGNOSIS — F1121 Opioid dependence, in remission: Secondary | ICD-10-CM | POA: Diagnosis not present

## 2021-08-29 DIAGNOSIS — F902 Attention-deficit hyperactivity disorder, combined type: Secondary | ICD-10-CM | POA: Diagnosis not present

## 2021-08-29 DIAGNOSIS — F17210 Nicotine dependence, cigarettes, uncomplicated: Secondary | ICD-10-CM

## 2021-08-29 MED ORDER — GABAPENTIN 100 MG PO CAPS
200.0000 mg | ORAL_CAPSULE | Freq: Three times a day (TID) | ORAL | 2 refills | Status: DC
  Filled 2021-08-29: qty 90, 15d supply, fill #0
  Filled 2021-10-11: qty 90, 15d supply, fill #1

## 2021-08-29 NOTE — Progress Notes (Signed)
BH MD/PA/NP OP Progress Note  08/29/2021 3:13 PM Victor Gomez  MRN:  [id number]  Chief Complaint: No chief complaint on file.  HPI:  Patient again presents in the office on his own although his mother is here with him in the waiting room.  Patient reports that he has been doing fairly well since last visit.  Patient reports that he believes his mother has been having a more difficult time than he, as her significant other passed away approximately 2 weeks ago.  Patient reports that he and this man were not significantly close and that the man did have chronic illnesses that was not completely unexpected.  Patient reports that he has been getting a few more arguments with his mother although he endorses realizing that his mother may be a bit more emotional due to her grief.  Patient reports that he is trying to be attentive to her feelings.  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  Patient reports that he feels like his mother can be a bit controlling at times and that she always has a task for him to do; however he again reiterates that he is trying to be attentive to her feelings.  Patient reports that he feels good when he stays busy.  Patient reports that he was recently asked to volunteer at a food bank and he is interested in this.  Patient reports that he continues to go to [employer] although he did miss some classes for 2 weeks due to changes in the schedule although he is now back on schedule.  Patient reports that he continues to get his methadone daily and has been doing so even when the change in classes occurred.  Patient reports that he continues to have ongoing "testing" at [facility] that he endorses appears to be some level of cognitive testing.  Patient reports he is sleeping well and his cravings for sugar have significantly decreased.  Patient reports that his daughter is talking to him last that he  believes that she might be busy adjusting to adult life.  Patient denies SI, HI and AVH.  Patient reports that he has not started taking the nicotine patches and endorses that he has decided again that he will wait to his birthday however he is now down to 1 pack every 3 days which is an improvement from 1-2 days visit.  Visit Diagnosis: No diagnosis found.  Past Psychiatric History:  Recent hospitalization in [place], [place] in 01/2021: Discharged on Risperdal 0.5 mg nightly, trazodone 100 mg nightly, Vistaril 25 mg as needed   Prior outpatient: [facility] diagnoses: ADD, dysgraphia, language disorder Ritalin (failed), Adderall (successful treatment)  07/2021-patient continued on gabapentin 20 mg 3 times daily, patient was prescribed 21 mg nicotine patch   06/2021: Strattera discontinued due to not requiring for stability, Gabapentin was started 200mg  TID, for anxiety and sugar cravings    04/2021:  Since his last appointment patient discontinued Risperdal on his own.  We will not restart Risperdal as this was likely to treat psychosis secondary to substance use.  Patient is not used cocaine or methamphetamines since his last outpatient visit.  Will increase patient's Strattera as he continues to have poor attention however mom endorses seeing very mild improvement on current dose. Discontinued Risperdal 0.5mg  daily, increased Strattera to 80mg  daily, and continued Vistaril to 25mg  BID PRN   02/2021: Continue Risperdal 0.5  mg daily, start Strattera 40 mg daily, continue Vistaril 25 mg twice daily as needed  Past Medical History:  Past Medical History:  Diagnosis Date   Hepatitis C    Methadone use    clinic each day    Past Surgical History:  Procedure Laterality Date   HERNIA REPAIR      Family Psychiatric History: Father: Paranoid schizophrenia, substance use disorder Paternal aunts: Depression Father and 2 paternal aunts-suicide attempts - Maternal grandpa: EtOH use  disorder    Family History: No family history on file.  Social History:  Social History   Socioeconomic History   Marital status: Legally Separated    Spouse name: Not on file   Number of children: Not on file   Years of education: Not on file   Highest education level: Not on file  Occupational History   Not on file  Tobacco Use   Smoking status: Every Day    Packs/day: 0.50    Types: Cigarettes   Smokeless tobacco: Never   Tobacco comments:    1 pack every 3 days   Vaping Use   Vaping Use: Never used  Substance and Sexual Activity   Alcohol use: Yes   Drug use: Not Currently    Types: Marijuana, "Crack" cocaine, Methamphetamines    Comment: last time he used 04/2014   Sexual activity: Not on file  Other Topics Concern   Not on file  Social History Narrative   Not on file   Social Determinants of Health   Financial Resource Strain: Not on file  Food Insecurity: Not on file  Transportation Needs: Not on file  Physical Activity: Not on file  Stress: Not on file  Social Connections: Not on file    Allergies: No Known Allergies  Metabolic Disorder Labs: Lab Results  Component Value Date   HGBA1C 5.6 01/22/2020   No results found for: "PROLACTIN" Lab Results  Component Value Date   CHOL 310 (H) 01/22/2020   TRIG 528 (H) 01/22/2020   HDL 35 (L) 01/22/2020   CHOLHDL 8.9 (H) 01/22/2020   LDLCALC 169 (H) 01/22/2020   LDLCALC 212 (H) 05/08/2016   No results found for: "TSH"  Therapeutic Level Labs: No results found for: "LITHIUM" No results found for: "VALPROATE" No results found for: "CBMZ"  Current Medications: Current Outpatient Medications  Medication Sig Dispense Refill   atorvastatin (LIPITOR) 40 MG tablet Take 1 tablet (40 mg total) by mouth daily. 30 tablet 11   atorvastatin (LIPITOR) 40 MG tablet Take 1 tablet (40 mg total) by mouth daily. 30 tablet 11   Cod Liver Oil OIL Take by mouth.     D3 SUPER STRENGTH 50 MCG (2000 UT) CAPS Take 1  capsule by mouth daily.     gabapentin (NEURONTIN) 100 MG capsule Take 2 capsules (200 mg total) by mouth 3 (three) times daily. 90 capsule 2   hydrOXYzine (ATARAX) 25 MG tablet Take 1 tablet (25 mg total) by mouth 3 (three) times daily as needed for anxiety. 120 tablet 2   Melatonin 10 MG CAPS Take by mouth.     methadone (DOLOPHINE) 10 MG tablet Take 10 mg by mouth daily.     nicotine (NICODERM CQ - DOSED IN MG/24 HOURS) 21 mg/24hr patch Place 1 patch (21 mg total) onto the skin daily. 28 patch 0   nicotine polacrilex (NICORETTE) 4 MG gum Take 1 each (4 mg total) by mouth as needed for smoking cessation. 100 tablet 0  Omega-3 Fatty Acids (FISH OIL PO) Take by mouth.     No current facility-administered medications for this visit.     Musculoskeletal: Strength & Muscle Tone: within normal limits Gait & Station: normal Patient leans: N/A  Psychiatric Specialty Exam: Review of Systems  Psychiatric/Behavioral:  Negative for dysphoric mood, hallucinations, self-injury and suicidal ideas. The patient is not hyperactive.     Blood pressure (!) 140/96, pulse 92, weight 242 lb (109.8 kg), SpO2 95 %.Body mass index is 32.82 kg/m.  General Appearance: Casual  Eye Contact:  Good  Speech:  Clear and Coherent  Volume:  Normal  Mood:  Euthymic  Affect:  Congruent  Thought Process:  Coherent  Orientation:  Full (Time, Place, and Person)  Thought Content: Logical   Suicidal Thoughts:  No  Homicidal Thoughts:  No  Memory:  Immediate;   Good Recent;   Good  Judgement:  Good  Insight:  Good  Psychomotor Activity:  Normal  Concentration:  Concentration: Good  Recall:  NA  Fund of Knowledge: Good  Language: Good  Akathisia:  NA    AIMS (if indicated): not done  Assets:  Communication Skills Desire for Improvement Resilience Social Support  ADL's:  Intact  Cognition:  baseline for patient  Sleep:  Good   Screenings: AIMS    Flowsheet Row [facility]  AIMS Total Score 0      AUDIT    Flowsheet Row [facility]  Alcohol Use Disorder Identification Test Final Score (AUDIT) 29      GAD-7    Flowsheet Row Office Visit from 04/26/2017 in [facility] Office Visit from 04/05/2016 in [facility] Office Visit from 10/11/2015 in [facility] Office Visit from 02/17/2015 in [facility]  Total GAD-7 Score 17 6 7 12       PHQ2-9    Flowsheet Row Office Visit from 07/26/2021 in [facility] Counselor from 05/19/2021 in [facility] Office Visit from 03/10/2021 in [facility] Office Visit from 04/21/2020 in [facility] Office Visit from 01/22/2020 in [facility]  PHQ-2 Total Score 0 2 0 2 4  PHQ-9 Total Score -- 8 -- 13 17      Flowsheet Row Counselor from 05/19/2021 in [facility] ED from 02/19/2018 in [facility] from 02/17/2018 in [facility]  C-SSRS RISK CATEGORY No Risk No Risk No Risk        Assessment and Plan: On assessment today patient appears to be doing very well.  Patient's continued improvement while sober suggests that most of his mood disorder and inattention may have been secondary to substance abuse.  Patient insight and judgment appear very good today as patient endorses ability to think about his mother's emotions while also thinking about his own future and what he would like.  Patient endorses wishing to have his own space and understanding boundary setting when it comes to people who use substances, but endorses that he has to learn about boundary setting with his mother.   Based on multiple assessments, this provider feels that patient and mother have a codependent relationship which may be detrimental to patient currently.  Patient and provider discussed  patient allowing mother some space to grieve also providing patient with more independence.  Patient endorses a dream of volunteering more and eventually getting his own job in his own place.  Patient reports that he would love to have a place on his mother's property but his own.  Patient endorses that he continues to sleep on the couch and does not really have his own space in the home and the home still feels very cluttered which she does not like.  Patient appears to be much more insightful about keeping peace with his mother than on initial assessment where he was inclined to get in many more arguments with her.  Goal setting was done with patient today to help patient set boundaries.   ADHD? - Patient reports receiving evaluation at [facility] for intellectual disability  Goals: 1.  Take dog to a dog park on his own. 2.  Volunteer at least 1 time in the next 6 weeks prior to next visit  Tobacco use disorder, severe - Patient not using patches at this time would like to reassess in August  Opiate use disorder, in remission EtOH use disorder, remission - Continue gabapentin 200 mg 3 times daily, for cravings  Collaboration of Care: Collaboration of Care:   Patient/Guardian was advised Release of Information must be obtained prior to any record release in order to collaborate their care with an outside provider. Patient/Guardian was advised if they have not already done so to contact the registration department to sign all necessary forms in order for [place] to release information regarding their care.   Consent: Patient/Guardian gives verbal consent for treatment and assignment of benefits for services provided during this visit. Patient/Guardian expressed understanding and agreed to proceed.   PGY-3 Bobbye Morton, MD 08/29/2021, 3:13 PM

## 2021-08-30 ENCOUNTER — Other Ambulatory Visit: Payer: Self-pay

## 2021-10-10 ENCOUNTER — Encounter (HOSPITAL_COMMUNITY): Payer: No Payment, Other | Admitting: Student in an Organized Health Care Education/Training Program

## 2021-10-11 ENCOUNTER — Other Ambulatory Visit: Payer: Self-pay

## 2021-10-28 ENCOUNTER — Ambulatory Visit (INDEPENDENT_AMBULATORY_CARE_PROVIDER_SITE_OTHER): Payer: No Payment, Other | Admitting: Clinical

## 2021-10-28 DIAGNOSIS — F332 Major depressive disorder, recurrent severe without psychotic features: Secondary | ICD-10-CM | POA: Diagnosis not present

## 2021-10-28 NOTE — Progress Notes (Unsigned)
THERAPIST PROGRESS NOTE  Session Time: 25 minutes  Participation Level: Active  Behavioral Response: CasualAlertEuthymic  Type of Therapy: Individual Therapy  Treatment Goals addressed: client will score less than a 10 on the PHQ9  ProgressTowards Goals: Progressing  Interventions: CBT and Supportive  Summary:  Victor Gomez is a 46 y.o. male who presents for the scheduled appointment oriented x5, appropriately dressed, and friendly.  Client denied hallucinations and delusions. Client reported on today he has been doing very well. Client reported over the past week and his mother felt ill. Client reported that he is still recovering to getting his energy back. Client reported he was a bit depressed last week because he could not go about his usual activities. Client reported he has also been down due to grieving a friend that passed two weeks ago. Client reported it was someone he was once friends with while he was actively using but they disconnected over the years. Client reported he has some regrets about falling out and not reaching back out to him as many times as he'd thoughts about it. Client reported he was able to go see his daughter graduate from high school. Client reported he is happy she finished because he struggled in school with reading and writing. Client reported he has been taking time to be more humble.  Evidence of progress towards goal:  client reported he is medication compliant 7 days per week. Client has improved score of PHQ9 and GAD 7.    10/28/2021   10:16 AM 04/26/2017   10:33 AM 04/05/2016    5:25 PM 10/11/2015    5:39 PM  GAD 7 : Generalized Anxiety Score  Nervous, Anxious, on Edge 1 2 2 1   Control/stop worrying 0 2 1 1   Worry too much - different things 0 2 1 1   Trouble relaxing 0 3 1 1   Restless 0 3 0 1  Easily annoyed or irritable 0 3 1 1   Afraid - awful might happen 0 2 0 1  Total GAD 7 Score 1 17 6 7   Anxiety Difficulty Not difficult at all         Flowsheet Row Counselor from 10/28/2021 in [facility]  PHQ-9 Total Score 1         Suicidal/Homicidal: Nowithout intent/plan  Therapist Response:  Therapist began the appointment asking the client how he has been doing since last seen. Therapist used CBT to engage using active listening and positive emotional support. Therapist used CBT to engage and ask the client about medication compliance for psych and substance use. Therapist used CBT to ask the client to discuss stressors that have contributed to depressed thoughts and feelings. Therapist used CBT ask the client to identify his progress with frequency of use with coping skills with continued practice in his daily activity.   Therapist assigned the client homework to practice self care.     Plan: Return again in 4 weeks.  Diagnosis: MDD, recurrent ,severe without psychosis  Collaboration of Care: Patient refused AEB none requested by the client.  Patient/Guardian was advised Release of Information must be obtained prior to any record release in order to collaborate their care with an outside provider. Patient/Guardian was advised if they have not already done so to contact the registration department to sign all necessary forms in order for to release information regarding their care.   Consent: Patient/Guardian gives verbal consent for treatment and assignment of benefits for services provided during this  visit. Patient/Guardian expressed understanding and agreed to proceed.   Neena Rhymes Bella Brummet, LCSW 10/28/2021

## 2021-10-29 NOTE — Plan of Care (Signed)
  Problem: Depression CCP Problem  1  Goal: LTG: Victor Gomez WILL SCORE LESS THAN 10 ON THE PATIENT HEALTH QUESTIONNAIRE (PHQ-9) Outcome: Progressing Goal: STG: Victor Gomez WILL PARTICIPATE IN AT LEAST 80% OF SCHEDULED INDIVIDUAL PSYCHOTHERAPY SESSIONS Outcome: Progressing Goal: STG: Victor Gomez WILL COMPLETE AT LEAST 80% OF ASSIGNED HOMEWORK Outcome: Progressing   Problem: Anxiety Disorder CCP Problem  1  Goal: LTG: Patient will score less than 5 on the Generalized Anxiety Disorder 7 Scale (GAD-7) Outcome: Progressing Goal: STG: Patient will participate in at least 80% of scheduled individual psychotherapy sessions Outcome: Progressing

## 2021-11-10 ENCOUNTER — Other Ambulatory Visit: Payer: Self-pay

## 2021-11-10 ENCOUNTER — Ambulatory Visit (INDEPENDENT_AMBULATORY_CARE_PROVIDER_SITE_OTHER): Payer: No Payment, Other | Admitting: Clinical

## 2021-11-10 ENCOUNTER — Encounter: Payer: Self-pay | Admitting: Internal Medicine

## 2021-11-10 ENCOUNTER — Ambulatory Visit (INDEPENDENT_AMBULATORY_CARE_PROVIDER_SITE_OTHER): Payer: Self-pay | Admitting: Internal Medicine

## 2021-11-10 DIAGNOSIS — F17210 Nicotine dependence, cigarettes, uncomplicated: Secondary | ICD-10-CM

## 2021-11-10 DIAGNOSIS — F17200 Nicotine dependence, unspecified, uncomplicated: Secondary | ICD-10-CM

## 2021-11-10 DIAGNOSIS — E782 Mixed hyperlipidemia: Secondary | ICD-10-CM

## 2021-11-10 DIAGNOSIS — F332 Major depressive disorder, recurrent severe without psychotic features: Secondary | ICD-10-CM

## 2021-11-10 DIAGNOSIS — G8929 Other chronic pain: Secondary | ICD-10-CM

## 2021-11-10 DIAGNOSIS — S82209S Unspecified fracture of shaft of unspecified tibia, sequela: Secondary | ICD-10-CM

## 2021-11-10 MED ORDER — ROSUVASTATIN CALCIUM 10 MG PO TABS
10.0000 mg | ORAL_TABLET | Freq: Every day | ORAL | 11 refills | Status: DC
  Filled 2021-11-11: qty 30, 30d supply, fill #0
  Filled 2022-06-19 – 2022-06-22 (×2): qty 30, 30d supply, fill #1
  Filled 2022-08-19: qty 30, 30d supply, fill #2
  Filled 2022-08-21: qty 30, 30d supply, fill #3

## 2021-11-10 MED ORDER — VARENICLINE TARTRATE 0.5 MG PO TABS
0.5000 mg | ORAL_TABLET | Freq: Two times a day (BID) | ORAL | 0 refills | Status: AC
  Filled 2021-11-11: qty 60, 30d supply, fill #0

## 2021-11-10 MED ORDER — DULOXETINE HCL 60 MG PO CPEP: 60.0000 mg | ORAL_CAPSULE | Freq: Every day | ORAL | 2 refills | Status: DC

## 2021-11-10 MED ORDER — NICOTINE 21 MG/24HR TD PT24
21.0000 mg | MEDICATED_PATCH | Freq: Every day | TRANSDERMAL | 0 refills | Status: DC
  Filled 2021-11-11: qty 28, 28d supply, fill #0

## 2021-11-10 MED ORDER — NICOTINE POLACRILEX 4 MG MT GUM: 4.0000 mg | CHEWING_GUM | OROMUCOSAL | 0 refills | Status: DC | PRN

## 2021-11-10 NOTE — Assessment & Plan Note (Signed)
Patient reports that he has not been taking his statin. Was not able to tolerate due to feelings of lightheadedness and dizziness.   Prior lipid panels indicate need for statin for primary prevention.  - will trial Rosuvastatin 10mg  given lower side effect profile. If he is not able to tolerate this, will try bempedoic acid.

## 2021-11-10 NOTE — Assessment & Plan Note (Addendum)
Neuropathic pain in lower leg. Unable to tolerate Gabapentin due to lower extremity swelling. Swelling has since resolved after stopping gabapenitn. Patient states he has never tried duloxetine.   Patient with untreated depression as well. Duloxetine should help both with neuropathic pain as well as depression. - Duloxetine 60 - 1 month fu

## 2021-11-10 NOTE — Progress Notes (Signed)
   CC: follow up visit  HPI:[name] is a 46 y.o. male who presents for evaluation of pain. Please see individual problem based A/P for details.  Neuropathic pain in lower leg. Unable to tolerate Gabapentin due to lower extremity swelling. Swelling has since resolved. Patient states he has never tried duloxetine.   Patient reports that he has not been taking his statin. Was not able to tolerate due to feelings of lightheadedness and dizziness.   Currently smoking 1ppd. Is interested in cessation. Has used lozenges in past with some benefit. Willing to try nicotine replacement and/or chantix.  Depression, PHQ-9: Based on the patients  [place] Visit from 11/10/2021 in [place]  PHQ-9 Total Score 12      score we have .  Past Medical History:  Diagnosis Date   Hepatitis C    Methadone use    clinic each day   Review of Systems:   Review of Systems  Constitutional: Negative.   HENT: Negative.    Respiratory: Negative.    Cardiovascular: Negative.   Skin: Negative.      Physical Exam: Vitals:   11/10/21 1100  BP: 128/81  Pulse: 67  Temp: 98.2 F (36.8 C)  TempSrc: Oral  SpO2: 97%  Weight: 244 lb 11.2 oz (111 kg)  Height: 6' (1.829 m)   General: NAD Cardiovascular: Normal rate, regular rhythm.  No murmurs, rubs, or gallops Pulmonary : Equal breath sounds, No wheezes, rales, or rhonchi Ext: No edema in lower extremities, no tenderness to palpation of lower extremities.   Assessment & Plan:   See Encounters Tab for problem based charting.  Patient with untreated depression as well. Duloxetine should help both with neuropathic pain as well as depression. - Duloxetine 60 - 1 month fu  Prior lipid panels indicate need for statin for primary prevention.  - will trial Rosuvastatin 10mg  given lower side effect profile. If he is not able to tolerate this, will try bempedoic acid.   Will try nicotine replacement therapy.  Nicotine patches and gum. Counseled on optimal use.   Patient discussed with Dr. Evette Doffing

## 2021-11-10 NOTE — Progress Notes (Signed)
THERAPIST PROGRESS NOTE  Session Time: 25 minutes  Participation Level: Active  Behavioral Response: CasualAlertAnxious  Type of Therapy: Individual Therapy  Treatment Goals addressed: client will score less than a 10 on the PHQ9  ProgressTowards Goals: Not Progressing  Interventions: CBT and Supportive  Summary:  Victor Gomez is a 46 y.o. male who presents for the scheduled appointment oriented x5, appropriately dressed, and friendly.  Client denied hallucinations and delusions. Client reported on today he has been feeling stressed. Client reported last week he relapsed using cocaine.  Client reported he gave into feelings of stress related to grief of his friend as previously mentioned and feeling overwhelmed from the amount of stress that his mother put on him.  Client reported he is grateful that he has a roof over his head and that his mother does have a hard time with trusting him.  Client reported his mother does not think that he is telling the truth to his providers or doing what he is supposed to do to prevent relapse.  Client reported his mother is aware that he relapsed.  Client reported he hates that he did have a setback because he felt like crap afterwards.  Client reported the male friend that he relapsed with continues to attempt to contact him that he has been ignoring her phone calls.  Client reported he cannot associate himself with people who engage in substance use.  Client reported he has a good counselor at [employer] where he receives his methadone treatment and attends [facility] IOP.  Client reported he will go back to attending group 3 times per week because he does benefit from it.  Client reported feeling down due to being call himself for relapsing but knows that he is capable of getting back on track.  Client reported he has positive outlets such as attending groups, going to church, and praying. Evidence of progress towards goal: Client reported he uses 2 skill of problem  solving and utilizing family/social support to help him cope with depression triggers related to substance use. Flowsheet Row Counselor from 11/10/2021 in [facility]  PHQ-9 Total Score 12        Suicidal/Homicidal: Nowithout intent/plan  Therapist Response:  Therapist began the appointment asking the client how he has been doing since last seen. Therapist used CBT to engage using active listening and positive emotional support. Therapist used CBT to allow the client time to discuss the current source of his depressed and anxious mood/thoughts. Therapist used CBT to normalize emotions related to grief and family stress. Therapist used CBT to discuss positive coping skills to deal with unwanted emotions and discussing utilizing the treatment support to get back on track. Therapist used CBT ask the client to identify his progress with frequency of use with coping skills with continued practice in his daily activity.    Therapist assigned to client homework to go back to attending his SA IOP group 3 times per week and practicing self-care.   Plan: Return again in 3 weeks.  Diagnosis: MDD,recurrent severe, without psychosis  Collaboration of Care: Patient refused AEB no other needs requested by the client at this time.  Patient/Guardian was advised Release of Information must be obtained prior to any record release in order to collaborate their care with an outside provider. Patient/Guardian was advised if they have not already done so to contact the registration department to sign all necessary forms in order for [place] to release information regarding their care.  Consent: Patient/Guardian gives verbal consent for treatment and assignment of benefits for services provided during this visit. Patient/Guardian expressed understanding and agreed to proceed.   [place], LCSW 11/10/2021

## 2021-11-10 NOTE — Assessment & Plan Note (Addendum)
Currently smoking 1ppd. Is interested in cessation. Has used lozenges in past with some benefit. Willing to try nicotine replacement and/or chantix.  Will try nicotine replacement therapy. Nicotine patches and gum. Counseled on optimal use.

## 2021-11-10 NOTE — Plan of Care (Signed)
  Problem: Depression CCP Problem  1  Goal: STG: [place] WILL PARTICIPATE IN AT LEAST 80% OF SCHEDULED INDIVIDUAL PSYCHOTHERAPY SESSIONS Outcome: Progressing Goal: STG: [place] WILL COMPLETE AT LEAST 80% OF ASSIGNED HOMEWORK Outcome: Progressing   Problem: Anxiety Disorder CCP Problem  1  Goal: LTG: Patient will score less than 5 on the Generalized Anxiety Disorder 7 Scale (GAD-7) Outcome: Progressing Goal: STG: Patient will participate in at least 80% of scheduled individual psychotherapy sessions Outcome: Progressing   Problem: Depression CCP Problem  1  Goal: LTG: Josemiguel WILL SCORE LESS THAN 10 ON THE PATIENT HEALTH QUESTIONNAIRE (PHQ-9) Outcome: Not Progressing

## 2021-11-10 NOTE — Patient Instructions (Addendum)
Dear Victor Gomez,  Thank you for trusting [place] with your care today. We discussed your smoking, cholesterol, and leg pain.  I have sent prescriptions for nicotine patches, Chantix, and nicotine gum. The patches and gum can help decrease cravings. The gum is to take when you have a craving.  The duloxetine is to help with your leg pain.  I have changed your statin from atorvastatin to rosuvastatin.  Please return to the clinic in 1 month to follow up on these changes.

## 2021-11-11 ENCOUNTER — Other Ambulatory Visit: Payer: Self-pay

## 2021-11-11 MED ORDER — DULOXETINE HCL 60 MG PO CPEP
60.0000 mg | ORAL_CAPSULE | ORAL | 2 refills | Status: DC
  Filled 2021-11-11: qty 30, 30d supply, fill #0

## 2021-11-11 NOTE — Progress Notes (Signed)
Internal Medicine Clinic Attending ° °Case discussed with Dr. Gawaluck  At the time of the visit.  We reviewed the resident’s history and exam and pertinent patient test results.  I agree with the assessment, diagnosis, and plan of care documented in the resident’s note.  °

## 2021-11-14 ENCOUNTER — Other Ambulatory Visit (HOSPITAL_COMMUNITY): Payer: Self-pay

## 2021-11-14 ENCOUNTER — Other Ambulatory Visit: Payer: Self-pay

## 2021-11-22 ENCOUNTER — Other Ambulatory Visit: Payer: Self-pay

## 2021-11-22 ENCOUNTER — Ambulatory Visit (INDEPENDENT_AMBULATORY_CARE_PROVIDER_SITE_OTHER): Payer: No Payment, Other | Admitting: Student in an Organized Health Care Education/Training Program

## 2021-11-22 ENCOUNTER — Encounter (HOSPITAL_COMMUNITY): Payer: Self-pay | Admitting: Student in an Organized Health Care Education/Training Program

## 2021-11-22 VITALS — BP 157/104 | HR 87 | Wt 234.0 lb

## 2021-11-22 DIAGNOSIS — F1994 Other psychoactive substance use, unspecified with psychoactive substance-induced mood disorder: Secondary | ICD-10-CM | POA: Diagnosis not present

## 2021-11-22 DIAGNOSIS — F1410 Cocaine abuse, uncomplicated: Secondary | ICD-10-CM | POA: Diagnosis not present

## 2021-11-22 MED ORDER — GABAPENTIN 100 MG PO CAPS
200.0000 mg | ORAL_CAPSULE | Freq: Three times a day (TID) | ORAL | 2 refills | Status: DC
  Filled 2021-11-22: qty 180, 30d supply, fill #0
  Filled 2021-12-23: qty 180, 30d supply, fill #1
  Filled 2022-01-20: qty 180, 30d supply, fill #2

## 2021-11-22 NOTE — Progress Notes (Signed)
[facility] MD/PA/NP OP Progress Note  11/22/2021 5:34 PM Victor Gomez  MRN:  [id number]  Chief Complaint:  Chief Complaint  Patient presents with   Follow-up   HPI: Victor Gomez is a 46 year old patient with a reported history of ADHD, opioid use disorder, cocaine use disorder, cigarette/tobacco use disorder, and substance-induced psychosis.  Patient saw his PCP 11/10/2021, where his gabapentin was discontinued and patient was started on Cymbalta.  Patient reports that he began having stomach cramping and increased dysphoric mood with initiation of Cymbalta.  Patient reports he is not able to take the medication for more than 1 week.  The patient reports unfortunately he relapsed in his cocaine use last week and he has been using daily.  Patient reports his last use was last night and he is proud of himself for not using so far today.  Patient reports that at this time he is not interested in going for inpatient care or detox and endorses that he believes he is able to handle this on his own.  Patient reports he continues to go to [facility], and they are aware of patient's relapse.  Patient reports that he has been given a timeframe of 3 weeks to get a "clean" urine sample otherwise they will put him in inpatient rehabilitation.  Patient reports that as of right now he has been enrolled in IOP by [facility].  Patient reports he does continue to take his methadone daily and this is one of his main reasons for not wanting to go inpatient.  Patient reports he also wants to continue during the yardwork for others and endorses a sense of accomplishment with this.  Patient reports that he has been more stressed by his mother lately.  Patient reports that she has begun interacting with a new man who patient endorses he is a trigger for him.  Patient reports he would like to leave the home, and is considering moving to what used to be his grandparents house.  Unfortunately, patient is not quite sure if the family still owns the  home.  Patient reports that unless she is using cocaine he feels dysphoric.  Patient endorses that he is also been watching the news again and is distraught by the new war between [place] and [place].  Patient reports that he is also had significant decrease in his concentration and endorses that outside of work allows him to have the best attention.  Patient also reports he did not sleep well last night and has had increased times of restlessness.  Patient reports that he believes his sleep is decreased due to mood and stress from getting in arguments with his mother.  Patient denies SI, HI and AVH.  Visit Diagnosis:    ICD-10-CM   1. Cocaine use disorder (HCC)  F14.10 gabapentin (NEURONTIN) 100 MG capsule    2. Substance induced mood disorder (HCC)  F19.94 gabapentin (NEURONTIN) 100 MG capsule      Past Psychiatric History:  Recent hospitalization in [place], [place] in 01/2021: Discharged on Risperdal 0.5 mg nightly, trazodone 100 mg nightly, Vistaril 25 mg as needed   Prior outpatient: [facility] diagnoses: ADD, dysgraphia, language disorder Ritalin (failed), Adderall (successful treatment) 08/2021-continue gabapentin 200 mg 3 times daily   07/2021-patient continued on gabapentin 200 mg 3 times daily, patient was prescribed 21 mg nicotine patch   06/2021: Strattera discontinued due to not requiring for stability, Gabapentin was started 23m TID, for anxiety and sugar cravings    04/2021:  Since his last  appointment patient discontinued Risperdal on his own.  We will not restart Risperdal as this was likely to treat psychosis secondary to substance use.  Patient is not used cocaine or methamphetamines since his last outpatient visit.  Will increase patient's Strattera as he continues to have poor attention however mom endorses seeing very mild improvement on current dose. Discontinued Risperdal 0.22m daily, increased Strattera to 814mdaily, and continued Vistaril to 2578mID PRN    02/2021: Continue Risperdal 0.5 mg daily, start Strattera 40 mg daily, continue Vistaril 25 mg twice daily as needed  Past Medical History:  Past Medical History:  Diagnosis Date   Hepatitis C    Methadone use    clinic each day    Past Surgical History:  Procedure Laterality Date   HERNIA REPAIR      Family Psychiatric History:  Father: Paranoid schizophrenia, substance use disorder Paternal aunts: Depression Father and 2 paternal aunts-suicide attempts - Maternal grandpa: EtOH use disorder  Family History: No family history on file.  Social History:  Social History   Socioeconomic History   Marital status: Legally Separated    Spouse name: Not on file   Number of children: Not on file   Years of education: Not on file   Highest education level: Not on file  Occupational History   Not on file  Tobacco Use   Smoking status: Every Day    Packs/day: 0.50    Types: Cigarettes   Smokeless tobacco: Never   Tobacco comments:    1 pack every 3 days   Vaping Use   Vaping Use: Never used  Substance and Sexual Activity   Alcohol use: Yes   Drug use: Not Currently    Types: Marijuana, "Crack" cocaine, Methamphetamines    Comment: last time he used 04/2014   Sexual activity: Not on file  Other Topics Concern   Not on file  Social History Narrative   Not on file   Social Determinants of Health   Financial Resource Strain: Not on file  Food Insecurity: Not on file  Transportation Needs: Not on file  Physical Activity: Not on file  Stress: Not on file  Social Connections: Not on file    Allergies: No Known Allergies  Metabolic Disorder Labs: Lab Results  Component Value Date   HGBA1C 5.6 01/22/2020   No results found for: "PROLACTIN" Lab Results  Component Value Date   CHOL 310 (H) 01/22/2020   TRIG 528 (H) 01/22/2020   HDL 35 (L) 01/22/2020   CHOLHDL 8.9 (H) 01/22/2020   LDLCALC 169 (H) 01/22/2020   LDLO[place]2 (H) 05/08/2016   No results found for:  "TSH"  Therapeutic Level Labs: No results found for: "LITHIUM" No results found for: "VALPROATE" No results found for: "CBMZ"  Current Medications: Current Outpatient Medications  Medication Sig Dispense Refill   gabapentin (NEURONTIN) 100 MG capsule Take 2 capsules (200 mg total) by mouth 3 (three) times daily. 180 capsule 2   Cod Liver Oil OIL Take by mouth.     D3 SUPER STRENGTH 50 MCG (2000 UT) CAPS Take 1 capsule by mouth daily.     hydrOXYzine (ATARAX) 25 MG tablet Take 1 tablet (25 mg total) by mouth 3 (three) times daily as needed for anxiety. 120 tablet 2   Melatonin 10 MG CAPS Take by mouth.     methadone (DOLOPHINE) 10 MG tablet Take 10 mg by mouth daily.     nicotine (NICODERM CQ - DOSED IN MG/24 HOURS)  21 mg/24hr patch Place 1 patch (21 mg total) onto the skin daily. 28 patch 0   nicotine polacrilex (NICORETTE) 4 MG gum Take 1 each (4 mg total) by mouth as needed for smoking cessation. 100 tablet 0   Omega-3 Fatty Acids (FISH OIL PO) Take by mouth.     rosuvastatin (CRESTOR) 10 MG tablet Take 1 tablet (10 mg total) by mouth daily. 30 tablet 11   varenicline (CHANTIX) 0.5 MG tablet Take 1 tablet (0.5 mg total) by mouth 2 (two) times daily. 60 tablet 0   No current facility-administered medications for this visit.     Musculoskeletal: Strength & Muscle Tone: within normal limits Gait & Station: normal Patient leans: N/A  Psychiatric Specialty Exam: Review of Systems  Gastrointestinal:  Positive for abdominal pain.  Psychiatric/Behavioral:  Positive for agitation, dysphoric mood and sleep disturbance. Negative for hallucinations and suicidal ideas. The patient is nervous/anxious.     Blood pressure (!) 157/104, pulse 87, weight 234 lb (106.1 kg), SpO2 96 %.Body mass index is 31.74 kg/m.  General Appearance: Disheveled  Eye Contact:  Fair  Speech:  Clear and Coherent  Volume:  Normal  Mood:  Dysphoric  Affect:  Congruent  Thought Process:  Goal Directed   Orientation:  Full (Time, Place, and Person)  Thought Content: Logical   Suicidal Thoughts:  No  Homicidal Thoughts:  No  Memory:  Immediate;   Good Recent;   Good Remote;   Good  Judgement:  Good  Insight:  Fair  Psychomotor Activity:  Normal  Concentration:  Concentration: Fair  Recall:  [place] of Knowledge: Fair  Language: Fair  Akathisia:    Handed:    AIMS (if indicated): not done  Assets:  Communication Skills Desire for Improvement Housing Resilience  ADL's:  Intact  Cognition: Impaired,  Mild  Sleep:  Poor   Screenings: AIMS    Flowsheet Row Admission (Discharged) from 02/09/2015 in [facility]  AIMS Total Score 0      AUDIT    Flowsheet Row Admission (Discharged) from 02/09/2015 in [facility]  Alcohol Use Disorder Identification Test Final Score (AUDIT) 29      GAD-7    Flowsheet Row Counselor from 10/28/2021 in [facility] Office Visit from 04/26/2017 in [facility] Office Visit from 04/05/2016 in [facility] Office Visit from 10/11/2015 in [facility] Office Visit from 02/17/2015 in [facility]  Total GAD-7 Score _0 PHQ2-9    Flowsheet Row Counselor from 11/10/2021 in [facility] Most recent reading at 11/10/2021  8:02 PM Office Visit from 11/10/2021 in [facility] Most recent reading at 11/10/2021 10:58 AM Counselor from 10/28/2021 in [facility] Most recent reading at 10/28/2021 10:11 AM Office Visit from 07/26/2021 in [facility] Most recent reading at 07/26/2021  3:49 PM Counselor from 05/19/2021 in [facility] Most recent reading at 05/19/2021  9:43 AM  PHQ-2 Total Score _1 0 2  PHQ-9 Total  Score _2 -- 8      Flowsheet Row Counselor from 05/19/2021 in [facility] ED from 02/19/2018 in [facility] ED from 02/17/2018 in [facility]  C-SSRS  RISK CATEGORY No Risk No Risk No Risk        Assessment and Plan:  Victor Gomez is a 46 year old patient with a reported history of ADHD, opioid use disorder, cocaine use disorder, cigarette/tobacco use disorder, and substance-induced psychosis.  Unfortunately, patient has relapsed approximately where he was when provider first met patient.  Patient is again endorsing poor concentration and significant irritability as well as dysphoric mood.  Thankfully, provider was able to observe patient sober and it does appear that patient's previous episodes of psychosis and dysphoric mood are substance-induced.  Sobriety will likely resolve most the patient's symptoms.  Patient has benefited from gabapentin in the past to help with cravings and irritability.  Unsure what may be causing patient's lower extremity swelling however, it does appear to coincide with working outdoors suggesting dehydration or some other organic cause.  Will begin seeing patient monthly again due to decompensation.  Patient continues to appear motivated to achieve sobriety, similar to last time.  Cocaine use disorder, moderate - Continue [facility] - Restart gabapentin 200 mg 3 times daily  Opiate use disorder, remission - Patient continues methadone daily [facility]  Substance-induced mood disorder - Discontinue Cymbalta (patient not taking)  Tobacco use disorder, severe - PCP treating  ADHD? - Mom will bring assessment done to next visit  Collaboration of Care: Collaboration of Care: Primary Care Provider AEB this provider since an email the patient PCP regarding changes  Patient/Guardian was advised Release of Information must be obtained prior to any record release in order to  collaborate their care with an outside provider. Patient/Guardian was advised if they have not already done so to contact the registration department to sign all necessary forms in order for [place] to release information regarding their care.   Consent: Patient/Guardian gives verbal consent for treatment and assignment of benefits for services provided during this visit. Patient/Guardian expressed understanding and agreed to proceed.   PGY-3 Freida Busman, MD 11/22/2021, 5:34 PM

## 2021-12-06 ENCOUNTER — Ambulatory Visit (INDEPENDENT_AMBULATORY_CARE_PROVIDER_SITE_OTHER): Payer: No Payment, Other | Admitting: Clinical

## 2021-12-06 DIAGNOSIS — F1994 Other psychoactive substance use, unspecified with psychoactive substance-induced mood disorder: Secondary | ICD-10-CM | POA: Diagnosis not present

## 2021-12-12 NOTE — Progress Notes (Signed)
   THERAPIST PROGRESS NOTE  Session Time: 55 minutes  Participation Level: Active  Behavioral Response: CasualAlertIrritable  Type of Therapy: Individual Therapy  Treatment Goals addressed: Client will complete at least 80% of assigned homework  ProgressTowards Goals: Not Progressing  Interventions: CBT  Summary:  Victor Gomez is a 46 y.o. male who presents for the scheduled appointment oriented x5, appropriately dressed, and friendly.  Client denied hallucinations and delusions.  Client presents with his mother today for the appointment. The client's mother reported she has had concerns about the client's mental health as well as his continued relapse use of illicit substances.  Client and mother reported he continues to use cocaine dating back to his last therapy appointment.  Client reported he is still going to [facility] drug services for group and methadone treatment.  Client reported he has been dealing with stresses of the recent passing of his friend as well as his own problems. Client reported he and his mother have been bumping heads a lot and notes that she gets on him about his responsibilities to a point he gets very upset. Mother reported the client seems to withdrawal or display heightened anxiety such as fidgety. Mother reported the client asks her for money daily which could be between 66- $60 a day. Mother reported she has little money in her account. 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 Client reported his counselor at [facility] has been discussing sending him to a rehab in [place].  Evidence of progress towards goal:  client has no been compliant with psych medications 7 days a week. Client has relapsed use to illicit substance at least 3x per week.  Suicidal/Homicidal: Nowithout intent/plan  Therapist Response:  Therapist began the appointment  asking the client how he has been doing since last seen. Therapist used CBT to engage using active listening and positive emotional support. Therapist used CBT to ask the client mother collateral information about the clients condition. Therapist used CBT to ask the client open ended questions about his continued use of illicit substances and impairment in daily functioning. Therapist used CBT to discuss identifying his triggers and using the family and additional substance use tx support to assist with obstaining from substance use. Therapist instructed the client and mother to send information to this office about his suggested tx program from his [facility] counselor. Therapist used CBT ask the client to identify his progress with frequency of use with coping skills with continued practice in his daily activity.    Therapist assigned client homework to get confirmation from his [facility] counselor about intensive tx options to report back to me.   Plan: Return again in 3 weeks.  Diagnosis: substance induced mood disorder  Collaboration of Care: Patient refused AEB none requested by the client.  Patient/Guardian was advised Release of Information must be obtained prior to any record release in order to collaborate their care with an outside provider. Patient/Guardian was advised if they have not already done so to contact the registration department to sign all necessary forms in order for [place] to release information regarding their care.   Consent: Patient/Guardian gives verbal consent for treatment and assignment of benefits for services provided during this visit. Patient/Guardian expressed understanding and agreed to proceed.   [place], LCSW 12/06/2021

## 2021-12-12 NOTE — Plan of Care (Signed)
  Problem: Depression CCP Problem  1  Goal: LTG: Deronte WILL SCORE LESS THAN 10 ON THE PATIENT HEALTH QUESTIONNAIRE (PHQ-9) Outcome: Not Progressing Goal: STG: Nicklas WILL PARTICIPATE IN AT LEAST 80% OF SCHEDULED INDIVIDUAL PSYCHOTHERAPY SESSIONS Outcome: Not Progressing Goal: STG: Toney WILL COMPLETE AT LEAST 80% OF ASSIGNED HOMEWORK Outcome: Not Progressing   Problem: Anxiety Disorder CCP Problem  1  Goal: LTG: Patient will score less than 5 on the Generalized Anxiety Disorder 7 Scale (GAD-7) Outcome: Not Progressing Goal: STG: Patient will participate in at least 80% of scheduled individual psychotherapy sessions Outcome: Not Progressing

## 2021-12-15 ENCOUNTER — Other Ambulatory Visit: Payer: Self-pay

## 2021-12-21 ENCOUNTER — Ambulatory Visit (INDEPENDENT_AMBULATORY_CARE_PROVIDER_SITE_OTHER): Payer: No Payment, Other | Admitting: Clinical

## 2021-12-21 ENCOUNTER — Encounter (HOSPITAL_COMMUNITY): Payer: No Payment, Other | Admitting: Psychiatry

## 2021-12-21 DIAGNOSIS — F1994 Other psychoactive substance use, unspecified with psychoactive substance-induced mood disorder: Secondary | ICD-10-CM | POA: Diagnosis not present

## 2021-12-21 NOTE — Progress Notes (Signed)
   THERAPIST PROGRESS NOTE  Session Time: 30 minutes  Participation Level: Active  Behavioral Response: CasualAlertEuthymic  Type of Therapy: Individual Therapy  Treatment Goals addressed: client will complete 80% of assigned homework  ProgressTowards Goals: Progressing  Interventions: CBT  Summary:  Victor Gomez is a 46 y.o. male who presents for the scheduled appointment oriented x5, appropriately dressed, and friendly.  Client denies hallucinations and delusions. Client reported on today's feeling fairly well.  Client reported he has been continuing to go to SA IOP at [facility] drug services in [place].  Client reported they have been talking about some changes will be made to the program due to funding issues but he is not sure what will happen next.  Client reported he has continued to use cocaine sparingly.  Client reported he has [place]  Client reported he will be leaving this Friday or Saturday coming up.  Client reported he has been on so many treatment facilities in the past but knows that he is taking a good step to getting himself back on track.  Client reported he has been a little sad that his daughter has not contacted him as much.  Client reported they have a good relationship and he knows she just gets caught up in her own day-to-day life.  Client reported things at home have been the same with his mom but a little bit better.  Client reported at times it is to be overwhelming with how hard she gets onto him about making sure he does we need to. Evidence of progress towards goal:  client reported psych medication compliance 7 days per week. Client reported achieving 1 goal of agreeing to go to residential treatment for substance abuse.   Suicidal/Homicidal: Nowithout intent/plan  Therapist Response:  Therapist began the appointment asking the client has been doing since last  seen. Therapist used CBT to engage using active listening and positive emotional support. Therapist used CBT to get the client time to discuss his thoughts and feelings as it pertains to stressors within the home and substance use. Therapist used CBT to engage and positively acknowledge and reinforce the clients decision to further his treatment for SA. Therapist used CBT to engage by positively reinforcing change talk with the client. Therapist used CBT ask the client to identify his progress with frequency of use with coping skills with continued practice in his daily activity.    Therapist assigned the client homework to practice self care.   Plan: Client will return for the scheduled therapy appointment in December 2023. Therapist instructed the client to call the office prior to if he will be in residential treatment to cancel his appointments and resume once he returns.  Diagnosis: substance induced mood disorder  Collaboration of Care: Patient refused AEB none requested by the client at this time.  Patient/Guardian was advised Release of Information must be obtained prior to any record release in order to collaborate their care with an outside provider. Patient/Guardian was advised if they have not already done so to contact the registration department to sign all necessary forms in order for [place] to release information regarding their care.   Consent: Patient/Guardian gives verbal consent for treatment and assignment of benefits for services provided during this visit. Patient/Guardian expressed understanding and agreed to proceed.   Neena Rhymes Sherrill Mckamie, LCSW 12/21/2021

## 2021-12-21 NOTE — Plan of Care (Signed)
  Problem: Depression CCP Problem  1  Goal: LTG: Victor Gomez WILL SCORE LESS THAN 10 ON THE PATIENT HEALTH QUESTIONNAIRE (PHQ-9) Outcome: Progressing Goal: STG: Victor Gomez WILL PARTICIPATE IN AT LEAST 80% OF SCHEDULED INDIVIDUAL PSYCHOTHERAPY SESSIONS Outcome: Progressing Goal: STG: Victor Gomez WILL COMPLETE AT LEAST 80% OF ASSIGNED HOMEWORK Outcome: Progressing   Problem: Anxiety Disorder CCP Problem  1  Goal: LTG: Patient will score less than 5 on the Generalized Anxiety Disorder 7 Scale (GAD-7) Outcome: Progressing Goal: STG: Patient will participate in at least 80% of scheduled individual psychotherapy sessions Outcome: Progressing

## 2021-12-23 ENCOUNTER — Other Ambulatory Visit: Payer: Self-pay

## 2022-01-16 ENCOUNTER — Other Ambulatory Visit: Payer: Self-pay

## 2022-01-17 ENCOUNTER — Ambulatory Visit (INDEPENDENT_AMBULATORY_CARE_PROVIDER_SITE_OTHER): Payer: No Payment, Other | Admitting: Clinical

## 2022-01-17 DIAGNOSIS — F1994 Other psychoactive substance use, unspecified with psychoactive substance-induced mood disorder: Secondary | ICD-10-CM | POA: Diagnosis not present

## 2022-01-17 NOTE — Progress Notes (Signed)
   THERAPIST PROGRESS NOTE  Session Time: 30 minutes  Participation Level: Active  Behavioral Response: CasualAlertEuthymic  Type of Therapy: Individual Therapy  Treatment Goals addressed: client will participate in 80% of scheduled psychotherapy sessions  ProgressTowards Goals: Progressing  Interventions: CBT and Supportive  Summary:  Victor Gomez is a 46 y.o. male who presents for the scheduled appointment with x 5, appropriately dressed, and friendly.  Client denies hallucinations and delusions. Client reported on today he is doing fairly well. Client reported since he was last seen he was [facility] due to having a positive drug screening for benzos. Client reported he was going to do the residential tx they reccommended about would arranged but they discharged him from services. Client reported he is now going to [name] in [place] for methadone dosing every morning. Client reported he is now taking 120mg  of methadone. Client reported they do not offer group therapy so he is attending AA/NA meetings regularly. Client reported he continues to use cocaine every other day. Client reported he is willing to engage in a outpatient intensive SA group. Client reported the trazadone is helping him and his mother is dispensing his medication. Evidence of progress towards goal:  client reported boundaries 7 days per week by not talking to triggering people. Client reported medication compliance 7 days per week.  Suicidal/Homicidal: Nowithout intent/plan  Therapist Response:  Therapist began the appointment asking the client how he has been doing since last seen. Therapist used CBT to engage using active listening and positive emotional support. Therapist used CBT to ask the client about depressive symptoms and thoughts related to. Therapist used CT to ask the client open ended questions about his illicit substance use and discussed  recommendation to [facility]. Therapist used CBT ask the client to identify his progress with frequency of use with coping skills with continued practice in his daily activity.    Therapist assigned the client homework to continue medication compliance and creating a structured routine which enforces boundaries.   Plan: Return again in 3 weeks.  Diagnosis: substance induced mood disorder  Collaboration of Care: Other therapist is making a referral to [facility] via [facility] services.  Patient/Guardian was advised Release of Information must be obtained prior to any record release in order to collaborate their care with an outside provider. Patient/Guardian was advised if they have not already done so to contact the registration department to sign all necessary forms in order for [facility] to release information regarding their care.   Consent: Patient/Guardian gives verbal consent for treatment and assignment of benefits for services provided during this visit. Patient/Guardian expressed understanding and agreed to proceed.   [place] Artemis Loyal, LCSW 01/17/2022

## 2022-01-20 ENCOUNTER — Other Ambulatory Visit: Payer: Self-pay

## 2022-02-02 ENCOUNTER — Ambulatory Visit (INDEPENDENT_AMBULATORY_CARE_PROVIDER_SITE_OTHER): Payer: Medicaid Other

## 2022-02-02 DIAGNOSIS — F332 Major depressive disorder, recurrent severe without psychotic features: Secondary | ICD-10-CM

## 2022-02-02 DIAGNOSIS — L539 Erythematous condition, unspecified: Secondary | ICD-10-CM

## 2022-02-02 MED ORDER — DULOXETINE HCL 60 MG PO CPEP: 60.0000 mg | ORAL_CAPSULE | Freq: Every day | ORAL | 2 refills | Status: DC

## 2022-02-02 MED ORDER — ROSUVASTATIN CALCIUM 10 MG PO TABS: 10.0000 mg | ORAL_TABLET | Freq: Every day | ORAL | 11 refills | Status: DC

## 2022-02-02 NOTE — Assessment & Plan Note (Addendum)
Noted for untreated depression at last clinic visit. Will trial duloxetine. Patient only took for 2 weeks last time and did not have any bothersome side effects. Could benefit neuropathic pain as well.

## 2022-02-02 NOTE — Assessment & Plan Note (Signed)
Patient reports redness at left foot for several days that is now improving. Reports irritation from sandal strap which resulted in redness and blister. Denies systemic signs of infection. Area of redness is diminishing in size. Patient does not have diabetes. Do not feel this warrants in-person visit. Discussed precautions and that if turns febrile or worsening of lesion / discomfort, patient should seek medical attention. Discussed ceasing foot soaks and neosporin.

## 2022-02-02 NOTE — Progress Notes (Signed)
[facility] Telephone Encounter Continuity Care Appointment  HPI:  This telephone encounter was created for Victor Gomez on 02/02/2022 for the following purpose/cc routine followup.  Patient states he broke his left leg 2 years ago and has had some neuropathy since then. This is currently managed well with gabapentin. Had some chronic swelling but this is gone.   States he stopped taking duloxetine after a couple weeks. Didn't see any benefit but didn't have any side effects.   Reports left foot redness and swelling that he and his mother feel is cellulitis. States he wore sandals without socks and the strapped irritated his skin. Initially red, then blistered, then blister popped and no pink skin is left. Denies fevers or chills. Denies discharge or eschar but reports clear fluid at first. Maybe 2.5 in long and 1 inch wide at worst. Reports history of cellulitis on right leg that required antibiotics but unsure what precipitated it. Elevating left foot now. Applying neosporin and soaking in epsom salt. Throbs when he stands up. Overall, feels it is much improved.  Reports he lost his rosuvastatin.   Smoking much less. Using lozenges. Now smoking 1 pack every 3 days (down from 1ppd).  Reports f/u scheduled with behavioral health tomorrow.   Past Medical History:  Past Medical History:  Diagnosis Date   Hepatitis C    Methadone use    clinic each day     ROS:  See detailed assessment and plan for pertinent ROS.    Assessment / Plan / Recommendations:  Please see A&P under problem oriented charting for assessment of the patient's acute and chronic medical conditions.  As always, pt is advised that if symptoms worsen or new symptoms arise, they should go to an urgent care facility or to to ER for further evaluation.   Consent and Medical Decision Making:  Patient discussed with Dr. Oswaldo Gomez This is a telephone encounter between Victor Gomez and  Victor Gomez on 02/02/2022 for routine follow up. The visit was conducted with the patient located at home and Victor Gomez at [facility]. The patient's identity was confirmed using their DOB and current address. The patient has consented to being evaluated through a telephone encounter and understands the associated risks (an examination cannot be Gomez and the patient may need to come in for an appointment) / benefits (allows the patient to remain at home, decreasing exposure to coronavirus). I personally spent 15 minutes on medical discussion.    Erythema of foot Assessment & Plan: Patient reports redness at left foot for several days that is now improving. Reports irritation from sandal strap which resulted in redness and blister. Denies systemic signs of infection. Area of redness is diminishing in size. Patient does not have diabetes. Do not feel this warrants in-person visit. Discussed precautions and that if turns febrile or worsening of lesion / discomfort, patient should seek medical attention. Discussed ceasing foot soaks and neosporin.   MDD (major depressive disorder), recurrent severe, without psychosis (HCC) Assessment & Plan: Noted for untreated depression at last clinic visit. Will trial duloxetine. Patient only took for 2 weeks last time and did not have any bothersome side effects. Could benefit neuropathic pain as well.  Orders: -     DULoxetine HCl; Take 1 capsule (60 mg total) by mouth daily.  Dispense: 30 capsule; Refill: 2  Other orders -     Rosuvastatin Calcium; Take 1 tablet (10 mg total) by mouth daily.  Dispense: 30 tablet;  Refill: 11

## 2022-02-03 ENCOUNTER — Encounter (HOSPITAL_COMMUNITY): Payer: No Payment, Other | Admitting: Psychiatry

## 2022-02-03 NOTE — Progress Notes (Signed)
Internal Medicine Clinic Attending  Case discussed with Dr. Cliffton Asters  At the time of the visit.  We reviewed the resident's history and pertinent patient test results.  I agree with the assessment, diagnosis, and plan of care documented in the resident's note.

## 2022-02-07 ENCOUNTER — Ambulatory Visit (INDEPENDENT_AMBULATORY_CARE_PROVIDER_SITE_OTHER): Payer: No Payment, Other | Admitting: Psychiatry

## 2022-02-07 ENCOUNTER — Encounter (HOSPITAL_COMMUNITY): Payer: Self-pay | Admitting: Psychiatry

## 2022-02-07 VITALS — BP 164/93 | HR 97

## 2022-02-07 DIAGNOSIS — F19959 Other psychoactive substance use, unspecified with psychoactive substance-induced psychotic disorder, unspecified: Secondary | ICD-10-CM | POA: Diagnosis not present

## 2022-02-07 DIAGNOSIS — F1994 Other psychoactive substance use, unspecified with psychoactive substance-induced mood disorder: Secondary | ICD-10-CM

## 2022-02-07 DIAGNOSIS — F1410 Cocaine abuse, uncomplicated: Secondary | ICD-10-CM

## 2022-02-07 MED ORDER — TRAZODONE HCL 100 MG PO TABS
100.0000 mg | ORAL_TABLET | Freq: Every day | ORAL | 2 refills | Status: DC
  Filled 2022-02-14: qty 30, 30d supply, fill #0

## 2022-02-07 MED ORDER — GABAPENTIN 100 MG PO CAPS
200.0000 mg | ORAL_CAPSULE | Freq: Three times a day (TID) | ORAL | 2 refills | Status: DC
  Filled 2022-02-14 – 2022-03-17 (×2): qty 180, 30d supply, fill #0
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
## 2022-02-14 ENCOUNTER — Other Ambulatory Visit: Payer: Self-pay

## 2022-02-15 ENCOUNTER — Ambulatory Visit (INDEPENDENT_AMBULATORY_CARE_PROVIDER_SITE_OTHER): Payer: Medicaid Other | Admitting: Clinical

## 2022-02-15 ENCOUNTER — Other Ambulatory Visit: Payer: Self-pay

## 2022-02-15 DIAGNOSIS — F1994 Other psychoactive substance use, unspecified with psychoactive substance-induced mood disorder: Secondary | ICD-10-CM

## 2022-02-17 ENCOUNTER — Other Ambulatory Visit (HOSPITAL_BASED_OUTPATIENT_CLINIC_OR_DEPARTMENT_OTHER): Payer: Self-pay

## 2022-02-17 MED ORDER — TRAZODONE HCL 100 MG PO TABS
100.0000 mg | ORAL_TABLET | Freq: Every day | ORAL | 2 refills | Status: DC
Start: 2022-02-07 — End: 2022-07-15
  Filled 2022-02-17: qty 30, 30d supply, fill #0

## 2022-02-17 MED ORDER — GABAPENTIN 100 MG PO CAPS
200.0000 mg | ORAL_CAPSULE | Freq: Three times a day (TID) | ORAL | 2 refills | Status: DC
  Filled 2022-02-17 – 2022-06-22 (×3): qty 180, 30d supply, fill #0

## 2022-02-26 NOTE — Progress Notes (Signed)
   THERAPIST PROGRESS NOTE Virtual Visit via Video Note  I connected with Victor Gomez on 02/15/2022 at  3:00 PM EST by a video enabled telemedicine application and verified that I am speaking with the correct person using two identifiers.  Location: Patient: home Provider: office   I discussed the limitations of evaluation and management by telemedicine and the availability of in person appointments. The patient expressed understanding and agreed to proceed.   Follow Up Instructions: I discussed the assessment and treatment plan with the patient. The patient was provided an opportunity to ask questions and all were answered. The patient agreed with the plan and demonstrated an understanding of the instructions.   The patient was advised to call back or seek an in-person evaluation if the symptoms worsen or if the condition fails to improve as anticipated.    Session Time: 20 minutes  Participation Level: Active  Behavioral Response: CasualAlertDepressed  Type of Therapy: Individual Therapy  Treatment Goals addressed: client will complete 80% of scheduled appointments  ProgressTowards Goals: Not Progressing  Interventions: CBT and Supportive  Summary:  Victor Gomez is a 47 y.o. male who presents for the scheduled appointment oriented times five, appropriately dressed, and friendly. Client denied hallucinations and delusions. Client reported on today doing alright. Client reported he had a good holiday with his mother. Client reported he is attending his treatment at [facility] daily and that is going well. Client reported yesterday was his first day in 4 days of using again. Client reported he has not been able to completely stop use. Client mother reported the client can go 3 or 4 days without asking her for money but then he reverts back to his usual pattern of pacing and asking for money. Client reported he gets agitated because he feels like his mother wants him in  residential treatment somewhere but it is not a step he is willing to take right now.  Evidence of progress towards goal:  Client reported continued to use illicit substances at least 3 days out of 7.   Suicidal/Homicidal: Nowithout intent/plan  Therapist Response:  Therapist began the appointment asking the client how he has been doing. Therapist used CBT to engage using active listening and positive emotional support. Therapist used CBT to engage and ask the client about frequency of illicit substance use. Therapist used CBT to engage with the client in discussing change talk and challenging his method of attempting to stop usage. Therapist used CBT ask the client to identify his progress with frequency of use with coping skills with continued practice in his daily activity.    Therapist assigned the client homework to speak with a counselor at [employer] to begin counseling with a substance use professional.   Plan: Return again in 3 weeks.  Diagnosis: substance induced mood disorder  Collaboration of Care: Patient refused AEB none requested by the client.  Patient/Guardian was advised Release of Information must be obtained prior to any record release in order to collaborate their care with an outside provider. Patient/Guardian was advised if they have not already done so to contact the registration department to sign all necessary forms in order for Victor Gomez to release information regarding their care.   Consent: Patient/Guardian gives verbal consent for treatment and assignment of benefits for services provided during this visit. Patient/Guardian expressed understanding and agreed to proceed.   [place], LCSW 02/15/2022

## 2022-03-02 ENCOUNTER — Other Ambulatory Visit: Payer: Self-pay

## 2022-03-17 ENCOUNTER — Other Ambulatory Visit (HOSPITAL_COMMUNITY): Payer: Self-pay

## 2022-03-17 ENCOUNTER — Other Ambulatory Visit: Payer: Self-pay

## 2022-03-21 ENCOUNTER — Telehealth (HOSPITAL_COMMUNITY): Payer: Self-pay | Admitting: Clinical

## 2022-03-21 NOTE — Telephone Encounter (Signed)
Therapist followed up with the clients mother. Therapist asked the clients mother if he was able to connect to a substance use counselor. Mother reported he was able to but he will need to find another program because the [place] counselor at [facility] will be leaving. Mother reported she plans to take him to [facility] for assessment of having a SA/MH counselor. Mother reported the client received a large bill pertaining to [facility] and asked to inquire about how to resolve the matter when he is indigent.

## 2022-04-04 ENCOUNTER — Encounter (HOSPITAL_COMMUNITY): Payer: No Payment, Other | Admitting: Psychiatry

## 2022-04-07 ENCOUNTER — Other Ambulatory Visit: Payer: Self-pay

## 2022-04-07 ENCOUNTER — Other Ambulatory Visit (HOSPITAL_COMMUNITY): Payer: Self-pay

## 2022-04-07 MED ORDER — ATOMOXETINE HCL 25 MG PO CAPS
25.0000 mg | ORAL_CAPSULE | Freq: Every morning | ORAL | 2 refills | Status: DC
  Filled 2022-04-07 – 2022-04-08 (×2): qty 30, 30d supply, fill #0

## 2022-04-07 MED ORDER — TRAZODONE HCL 50 MG PO TABS
25.0000 mg | ORAL_TABLET | Freq: Every evening | ORAL | 2 refills | Status: DC | PRN
  Filled 2022-04-07 – 2022-04-08 (×2): qty 15, 30d supply, fill #0

## 2022-04-07 MED ORDER — ARIPIPRAZOLE 5 MG PO TABS
ORAL_TABLET | ORAL | 2 refills | Status: DC
  Filled 2022-04-07 – 2022-04-08 (×2): qty 30, 30d supply, fill #0
  Filled 2022-05-06: qty 30, 30d supply, fill #1

## 2022-04-07 MED ORDER — GABAPENTIN 100 MG PO CAPS
200.0000 mg | ORAL_CAPSULE | Freq: Three times a day (TID) | ORAL | 2 refills | Status: DC
  Filled 2022-04-07 – 2022-04-19 (×2): qty 180, 30d supply, fill #0
  Filled 2022-05-20 (×2): qty 180, 30d supply, fill #1

## 2022-04-08 ENCOUNTER — Other Ambulatory Visit (HOSPITAL_COMMUNITY): Payer: Self-pay

## 2022-04-12 ENCOUNTER — Other Ambulatory Visit (HOSPITAL_COMMUNITY): Payer: Self-pay

## 2022-04-19 ENCOUNTER — Other Ambulatory Visit (HOSPITAL_COMMUNITY): Payer: Self-pay

## 2022-04-24 ENCOUNTER — Encounter (HOSPITAL_COMMUNITY): Payer: Self-pay

## 2022-04-24 ENCOUNTER — Encounter (HOSPITAL_COMMUNITY): Payer: Medicaid Other | Admitting: Student in an Organized Health Care Education/Training Program

## 2022-04-24 NOTE — Progress Notes (Deleted)
[place] MD/PA/NP OP Progress Note  04/24/2022 2:13 PM Victor Gomez  MRN:  [id number]  Chief Complaint: No chief complaint on file.  HPI: Victor Gomez is a 47 year old patient with a reported history of ADHD, opioid use disorder, cocaine use disorder, cigarette/tobacco use disorder, and substance-induced psychosis presented to [facility] outpatient clinic for medication management follow-up.  Current medication regimen  Current substance abuse   Visit Diagnosis: No diagnosis found.  Past Psychiatric History:  Recent hospitalization in [place], [place] in 01/2021: Discharged on Risperdal 0.5 mg nightly, trazodone 100 mg nightly, Vistaril 25 mg as needed   Prior outpatient: [facility] Childhood diagnoses: ADD, dysgraphia, language disorder Ritalin (failed), Adderall (successful treatment)  01/2022- Gabapentin helpful for anxiety '200mg'$  TID, still sing substances and not sure about rehabs. Still on methadone and trazodone '100mg'$  [facility] 11/2021- Patient gabapentin was dc'd and started on cymbalta by PCP and decompensated and had adverse side effects from cymbalta (stomach cramping). He also relapse on cocaine after medication adjustments. At visit restarted on gabapentin. Dc'd cymbalta 08/2021-continue gabapentin 200 mg 3 times daily   07/2021-patient continued on gabapentin 200 mg 3 times daily, patient was prescribed 21 mg nicotine patch   06/2021: Strattera discontinued due to not requiring for stability, Gabapentin was started '200mg'$  TID, for anxiety and sugar cravings    04/2021:  Since his last appointment patient discontinued Risperdal on his own.  We will not restart Risperdal as this was likely to treat psychosis secondary to substance use.  Patient is not used cocaine or methamphetamines since his last outpatient visit.  Will increase patient's Strattera as he continues to have poor attention however mom endorses seeing very mild improvement on current dose. Discontinued Risperdal 0.'5mg'$  daily, increased  Strattera to '80mg'$  daily, and continued Vistaril to '25mg'$  BID PRN   02/2021: Continue Risperdal 0.5 mg daily, start Strattera 40 mg daily, continue Vistaril 25 mg twice daily as needed    Past Medical History:  Past Medical History:  Diagnosis Date   Hepatitis C    Methadone use    clinic each day    Past Surgical History:  Procedure Laterality Date   HERNIA REPAIR      Family Psychiatric History:    Father: Paranoid schizophrenia, substance use disorder Paternal aunts: Depression Father and 2 paternal aunts-suicide attempts - Maternal grandpa: EtOH use disorder  Family History: No family history on file.  Social History:  Social History   Socioeconomic History   Marital status: Legally Separated    Spouse name: Not on file   Number of children: Not on file   Years of education: Not on file   Highest education level: Not on file  Occupational History   Not on file  Tobacco Use   Smoking status: Every Day    Packs/day: 0.50    Types: Cigarettes   Smokeless tobacco: Never   Tobacco comments:    1 pack every 3 days   Vaping Use   Vaping Use: Never used  Substance and Sexual Activity   Alcohol use: Yes   Drug use: Not Currently    Types: Marijuana, "Crack" cocaine, Methamphetamines    Comment: last time he used 04/2014   Sexual activity: Not on file  Other Topics Concern   Not on file  Social History Narrative   Not on file   Social Determinants of Health   Financial Resource Strain: Not on file  Food Insecurity: Not on file  Transportation Needs: Not on file  Physical Activity:  Not on file  Stress: Not on file  Social Connections: Not on file    Allergies: No Known Allergies  Metabolic Disorder Labs: Lab Results  Component Value Date   HGBA1C 5.6 01/22/2020   No results found for: "PROLACTIN" Lab Results  Component Value Date   CHOL 310 (H) 01/22/2020   TRIG 528 (H) 01/22/2020   HDL 35 (L) 01/22/2020   CHOLHDL 8.9 (H) 01/22/2020   LDLCALC 169  (H) 01/22/2020   LDLCALC 212 (H) 05/08/2016   No results found for: "TSH"  Therapeutic Level Labs: No results found for: "LITHIUM" No results found for: "VALPROATE" No results found for: "CBMZ"  Current Medications: Current Outpatient Medications  Medication Sig Dispense Refill   ARIPiprazole (ABILIFY) 5 MG tablet Take 1/2 tablet by mouth daily for 7 days, then take 1 tablet by mouth daily thereafter. 30 tablet 2   atomoxetine (STRATTERA) 25 MG capsule Take 1 capsule (25 mg total) by mouth every morning. 30 capsule 2   Cod Liver Oil OIL Take by mouth.     D3 SUPER STRENGTH 50 MCG (2000 UT) CAPS Take 1 capsule by mouth daily.     gabapentin (NEURONTIN) 100 MG capsule Take 2 capsules (200 mg total) by mouth 3 (three) times daily. 180 capsule 2   gabapentin (NEURONTIN) 100 MG capsule Take 2 capsules (200 mg total) by mouth 3 (three) times daily. 180 capsule 2   gabapentin (NEURONTIN) 100 MG capsule Take 2 capsules (200 mg total) by mouth 3 (three) times daily. 180 capsule 2   Melatonin 10 MG CAPS Take by mouth.     methadone (DOLOPHINE) 10 MG tablet Take 10 mg by mouth daily.     nicotine (NICODERM CQ - DOSED IN MG/24 HOURS) 21 mg/24hr patch Place 1 patch (21 mg total) onto the skin daily. 28 patch 0   nicotine polacrilex (NICORETTE) 4 MG gum Take 1 each (4 mg total) by mouth as needed for smoking cessation. 100 tablet 0   Omega-3 Fatty Acids (FISH OIL PO) Take by mouth.     rosuvastatin (CRESTOR) 10 MG tablet Take 1 tablet (10 mg total) by mouth daily. 30 tablet 11   traZODone (DESYREL) 100 MG tablet Take 1 tablet (100 mg total) by mouth at bedtime. 30 tablet 2   traZODone (DESYREL) 100 MG tablet Take 1 tablet (100 mg total) by mouth at bedtime. 30 tablet 2   traZODone (DESYREL) 50 MG tablet Take 0.5 tablets (25 mg total) by mouth at bedtime as needed. 15 tablet 2   No current facility-administered medications for this visit.     Musculoskeletal: Strength & Muscle Tone: {desc; muscle  tone:32375} Gait & Station: {PE GAIT ED EF:[id number] Patient leans: {Patient Leans:21022755}  Psychiatric Specialty Exam: Review of Systems  There were no vitals taken for this visit.There is no height or weight on file to calculate BMI.  General Appearance: {Appearance:22683}  Eye Contact:  {BHH EYE CONTACT:22684}  Speech:  {Speech:22685}  Volume:  {Volume (PAA):22686}  Mood:  {BHH MOOD:22306}  Affect:  {Affect (PAA):22687}  Thought Process:  {Thought Process (PAA):22688}  Orientation:  {BHH ORIENTATION (PAA):22689}  Thought Content: {Thought Content:22690}   Suicidal Thoughts:  {ST/HT (PAA):22692}  Homicidal Thoughts:  {ST/HT (PAA):22692}  Memory:  {BHH MEMORY:22881}  Judgement:  {Judgement (PAA):22694}  Insight:  {Insight (PAA):22695}  Psychomotor Activity:  {Psychomotor (PAA):22696}  Concentration:  {Concentration:21399}  Recall:  {BHH GOOD/FAIR/POOR:22877}  Fund of Knowledge: {BHH GOOD/FAIR/POOR:22877}  Language: {BHH GOOD/FAIR/POOR:22877}  Akathisia:  {BHH YES  OR NO:22294}  Handed:  {Handed:22697}  AIMS (if indicated): {Desc; done/not:10129}  Assets:  {Assets (PAA):22698}  ADL's:  {BHH XO:4411959  Cognition: {chl bhh cognition:304700322}  Sleep:  {BHH GOOD/FAIR/POOR:22877}   Screenings: AIMS    Flowsheet Row Admission (Discharged) from 02/09/2015 in [facility]  AIMS Total Score 0      AUDIT    Flowsheet Row Admission (Discharged) from 02/09/2015 in [facility]  Alcohol Use Disorder Identification Test Final Score (AUDIT) 29      GAD-7    Flowsheet Row Counselor from 01/17/2022 in [facility] Counselor from 10/28/2021 in [facility] Office Visit from 04/26/2017 in [place] Office Visit from 04/05/2016 in [facility] Office Visit from 10/11/2015 in [place]  Total GAD-7 Score 0 '1 17 6 7      '$ PHQ2-9    Flowsheet Row Counselor from 01/17/2022 in [facility] Most recent reading at 01/17/2022  3:26 PM Counselor from 11/10/2021 in [facility] Most recent reading at 11/10/2021  8:02 PM Office Visit from 11/10/2021 in [place] Most recent reading at 11/10/2021 10:58 AM Counselor from 10/28/2021 in [facility] Most recent reading at 10/28/2021 10:11 AM Office Visit from 07/26/2021 in [place] Most recent reading at 07/26/2021  3:49 PM  PHQ-2 Total Score '2 2 2 1 '$ 0  PHQ-9 Total Score '9 12 12 1 '$ --      Flowsheet Row Counselor from 05/19/2021 in [facility] ED from 02/19/2018 in [facility] Emergency Department at [facility] ED from 02/17/2018 in [facility] Emergency Department at [place] No Risk No Risk No Risk        Assessment and Plan: ***  Collaboration of Care: Collaboration of Care: [facility] OP Collaboration of Care:[id number]}  Patient/Guardian was advised Release of Information must be obtained prior to any record release in order to collaborate their care with an outside provider. Patient/Guardian was advised if they have not already done so to contact the registration department to sign all necessary forms in order for [place] to release information regarding their care.   Consent: Patient/Guardian gives verbal consent for treatment and assignment of benefits for services provided during this visit. Patient/Guardian expressed understanding and agreed to proceed.    Freida Busman, MD 04/24/2022, 2:13 PM

## 2022-05-01 ENCOUNTER — Other Ambulatory Visit (HOSPITAL_COMMUNITY): Payer: Self-pay

## 2022-05-01 MED ORDER — ARIPIPRAZOLE 10 MG PO TABS
10.0000 mg | ORAL_TABLET | Freq: Every day | ORAL | 2 refills | Status: DC
  Filled 2022-05-01 – 2022-08-09 (×2): qty 30, 30d supply, fill #0

## 2022-05-06 ENCOUNTER — Other Ambulatory Visit (HOSPITAL_COMMUNITY): Payer: Self-pay

## 2022-05-20 ENCOUNTER — Other Ambulatory Visit (HOSPITAL_COMMUNITY): Payer: Self-pay

## 2022-06-19 ENCOUNTER — Other Ambulatory Visit (HOSPITAL_COMMUNITY): Payer: Self-pay

## 2022-06-20 ENCOUNTER — Other Ambulatory Visit: Payer: Self-pay

## 2022-06-20 ENCOUNTER — Encounter: Payer: Self-pay | Admitting: Pharmacist

## 2022-06-22 ENCOUNTER — Other Ambulatory Visit: Payer: Self-pay

## 2022-06-22 ENCOUNTER — Other Ambulatory Visit (HOSPITAL_COMMUNITY): Payer: Self-pay

## 2022-06-30 ENCOUNTER — Other Ambulatory Visit (HOSPITAL_COMMUNITY): Payer: Self-pay

## 2022-06-30 ENCOUNTER — Other Ambulatory Visit: Payer: Self-pay

## 2022-06-30 MED ORDER — GABAPENTIN 100 MG PO CAPS
200.0000 mg | ORAL_CAPSULE | Freq: Three times a day (TID) | ORAL | 2 refills | Status: DC
  Filled 2022-06-30: qty 180, 30d supply, fill #0

## 2022-07-06 ENCOUNTER — Encounter: Payer: Medicaid Other | Admitting: Student

## 2022-07-10 ENCOUNTER — Encounter: Payer: Self-pay | Admitting: *Deleted

## 2022-07-14 ENCOUNTER — Other Ambulatory Visit (HOSPITAL_COMMUNITY): Payer: Self-pay

## 2022-07-15 ENCOUNTER — Emergency Department (HOSPITAL_COMMUNITY)
Admission: EM | Admit: 2022-07-15 | Discharge: 2022-07-17 | Disposition: A | Discharge status: Home / Self Care | Payer: Medicaid Other | Attending: Emergency Medicine | Admitting: Emergency Medicine

## 2022-07-15 ENCOUNTER — Other Ambulatory Visit: Payer: Self-pay

## 2022-07-15 ENCOUNTER — Encounter (HOSPITAL_COMMUNITY): Payer: Self-pay | Admitting: Registered Nurse

## 2022-07-15 ENCOUNTER — Ambulatory Visit (HOSPITAL_COMMUNITY)
Admission: EM | Admit: 2022-07-15 | Discharge: 2022-07-15 | Disposition: A | Discharge status: Other Acute Inpatient Hospital | Payer: Medicaid Other | Attending: Family | Admitting: Family

## 2022-07-15 DIAGNOSIS — F200 Paranoid schizophrenia: Secondary | ICD-10-CM | POA: Insufficient documentation

## 2022-07-15 DIAGNOSIS — F1994 Other psychoactive substance use, unspecified with psychoactive substance-induced mood disorder: Secondary | ICD-10-CM

## 2022-07-15 DIAGNOSIS — F319 Bipolar disorder, unspecified: Secondary | ICD-10-CM | POA: Diagnosis not present

## 2022-07-15 DIAGNOSIS — F1910 Other psychoactive substance abuse, uncomplicated: Secondary | ICD-10-CM | POA: Diagnosis not present

## 2022-07-15 DIAGNOSIS — R079 Chest pain, unspecified: Secondary | ICD-10-CM

## 2022-07-15 DIAGNOSIS — F1920 Other psychoactive substance dependence, uncomplicated: Secondary | ICD-10-CM | POA: Insufficient documentation

## 2022-07-15 DIAGNOSIS — F1914 Other psychoactive substance abuse with psychoactive substance-induced mood disorder: Secondary | ICD-10-CM | POA: Diagnosis not present

## 2022-07-15 DIAGNOSIS — F29 Unspecified psychosis not due to a substance or known physiological condition: Secondary | ICD-10-CM

## 2022-07-15 DIAGNOSIS — E876 Hypokalemia: Secondary | ICD-10-CM

## 2022-07-15 DIAGNOSIS — F22 Delusional disorders: Secondary | ICD-10-CM | POA: Insufficient documentation

## 2022-07-15 DIAGNOSIS — F32A Depression, unspecified: Secondary | ICD-10-CM | POA: Insufficient documentation

## 2022-07-15 DIAGNOSIS — F1510 Other stimulant abuse, uncomplicated: Secondary | ICD-10-CM | POA: Diagnosis not present

## 2022-07-15 DIAGNOSIS — F15151 Other stimulant abuse with stimulant-induced psychotic disorder with hallucinations: Secondary | ICD-10-CM | POA: Diagnosis present

## 2022-07-15 DIAGNOSIS — F1410 Cocaine abuse, uncomplicated: Secondary | ICD-10-CM

## 2022-07-15 DIAGNOSIS — F15959 Other stimulant use, unspecified with stimulant-induced psychotic disorder, unspecified: Secondary | ICD-10-CM | POA: Diagnosis not present

## 2022-07-15 HISTORY — DX: Other psychoactive substance use, unspecified with psychoactive substance-induced mood disorder: F19.94

## 2022-07-15 HISTORY — DX: Hyperlipidemia, unspecified: E78.5

## 2022-07-15 HISTORY — DX: Other psychoactive substance use, unspecified with psychoactive substance-induced psychotic disorder, unspecified: F19.959

## 2022-07-15 HISTORY — DX: Attention-deficit hyperactivity disorder, unspecified type: F90.9

## 2022-07-15 HISTORY — DX: Major depressive disorder, single episode, unspecified: F32.9

## 2022-07-15 LAB — CBC
HCT: 46.5 % (ref 39.0–52.0)
Hemoglobin: 15.2 g/dL (ref 13.0–17.0)
MCH: 28.4 pg (ref 26.0–34.0)
MCHC: 32.7 g/dL (ref 30.0–36.0)
MCV: 86.8 fL (ref 80.0–100.0)
Platelets: 196 10*3/uL (ref 150–400)
RBC: 5.36 MIL/uL (ref 4.22–5.81)
RDW: 12.7 % (ref 11.5–15.5)
WBC: 8.7 10*3/uL (ref 4.0–10.5)
nRBC: 0 % (ref 0.0–0.2)

## 2022-07-15 LAB — RAPID URINE DRUG SCREEN, HOSP PERFORMED
Amphetamines: POSITIVE — AB
Barbiturates: NOT DETECTED
Benzodiazepines: POSITIVE — AB
Cocaine: POSITIVE — AB
Opiates: NOT DETECTED
Tetrahydrocannabinol: NOT DETECTED

## 2022-07-15 LAB — COMPREHENSIVE METABOLIC PANEL
ALT: 18 U/L (ref 0–44)
AST: 55 U/L — ABNORMAL HIGH (ref 15–41)
Albumin: 4.1 g/dL (ref 3.5–5.0)
Alkaline Phosphatase: 81 U/L (ref 38–126)
Anion gap: 13 (ref 5–15)
BUN: 13 mg/dL (ref 6–20)
CO2: 23 mmol/L (ref 22–32)
Calcium: 9.3 mg/dL (ref 8.9–10.3)
Chloride: 103 mmol/L (ref 98–111)
Creatinine, Ser: 0.94 mg/dL (ref 0.61–1.24)
GFR, Estimated: >60 mL/min (ref >60)
Glucose, Bld: 117 mg/dL — ABNORMAL HIGH (ref 70–99)
Potassium: 3.2 mmol/L — ABNORMAL LOW (ref 3.5–5.1)
Sodium: 139 mmol/L (ref 135–145)
Total Bilirubin: 0.8 mg/dL (ref 0.3–1.2)
Total Protein: 6.9 g/dL (ref 6.5–8.1)

## 2022-07-15 LAB — ETHANOL: Alcohol, Ethyl (B): <10 mg/dL (ref <10)

## 2022-07-15 LAB — TROPONIN I (HIGH SENSITIVITY): Troponin I (High Sensitivity): 6 ng/L (ref <18)

## 2022-07-15 LAB — CK: Total CK: 1413 U/L — ABNORMAL HIGH (ref 49–397)

## 2022-07-15 MED ORDER — HYDROXYZINE HCL 25 MG PO TABS: 25.0000 mg | ORAL_TABLET | Freq: Three times a day (TID) | ORAL | Status: DC | PRN

## 2022-07-15 MED ORDER — MAGNESIUM HYDROXIDE 400 MG/5ML PO SUSP
30.0000 mL | Freq: Every day | ORAL | Status: DC | PRN
Start: 2022-07-15 — End: 2022-07-15

## 2022-07-15 MED ORDER — OLANZAPINE 5 MG PO TBDP: 5.0000 mg | ORAL_TABLET | Freq: Two times a day (BID) | ORAL | Status: DC

## 2022-07-15 MED ORDER — SODIUM CHLORIDE 0.9 % IV BOLUS
1000.0000 mL | Freq: Once | INTRAVENOUS | Status: AC
  Administered 2022-07-15: 1000 mL via INTRAVENOUS

## 2022-07-15 MED ORDER — ZIPRASIDONE MESYLATE 20 MG IM SOLR
20.0000 mg | Freq: Once | INTRAMUSCULAR | Status: AC
  Administered 2022-07-15: 20 mg via INTRAMUSCULAR
  Filled 2022-07-15: qty 20

## 2022-07-15 MED ORDER — ALUM & MAG HYDROXIDE-SIMETH 200-200-20 MG/5ML PO SUSP: 30.0000 mL | ORAL | Status: DC | PRN

## 2022-07-15 MED ORDER — STERILE WATER FOR INJECTION IJ SOLN
INTRAMUSCULAR | Status: AC
  Administered 2022-07-15: 10 mL
  Filled 2022-07-15: qty 10

## 2022-07-15 MED ORDER — TRAZODONE HCL 50 MG PO TABS: 50.0000 mg | ORAL_TABLET | Freq: Every evening | ORAL | Status: DC | PRN

## 2022-07-15 MED ORDER — ACETAMINOPHEN 325 MG PO TABS: 650.0000 mg | ORAL_TABLET | Freq: Four times a day (QID) | ORAL | Status: DC | PRN

## 2022-07-15 NOTE — ED Notes (Signed)
Patient's IVC paperwork given to [place] [employer] operational officer.
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
## 2022-07-15 NOTE — ED Notes (Signed)
Sitter at bedside.

## 2022-07-15 NOTE — ED Provider Notes (Signed)
New Site EMERGENCY DEPARTMENT AT [facility] Provider Note   CSN: [id number] Arrival date & time: 07/15/22  1308     History {Add pertinent medical, surgical, social history, OB history to HPI:1} Chief Complaint  Patient presents with   Chest Pain    Victor Gomez is a 47 y.o. male.   Chest Pain  This patient is a 47 year old male, no specific cardiac history, the patient has not had any cardiac testing within our system, he does have a history of some psychiatric disorders and takes Abilify, Strattera and methadone.  The patient had endorsed using drugs this morning at 3 AM, presented to the behavioral health urgent care with complaints of paranoid schizophrenia and hitting a wall with a hammer, sent here because of having chest pain, denies chest pain at this time, endorses restless legs.  The patient was sent here for evaluation of the chest pain prior to being seen for a psychiatric reason.  Evidently the patient is under involuntary commitment because of his abnormal behavior    Home Medications Prior to Admission medications   Medication Sig Start Date End Date Taking? Authorizing Provider  diphenhydrAMINE (BENADRYL ALLERGY) 25 MG tablet Take 25 mg by mouth at bedtime as needed for sleep.   Yes [provider]  docusate sodium (COLACE) 100 MG capsule Take 600 mg by mouth daily.   Yes [provider]  gabapentin (NEURONTIN) 100 MG capsule Take 2 capsules (200 mg total) by mouth 3 (three) times daily. 02/07/22  Yes Karsten Ro, MD  Melatonin 10 MG TABS Take 10 mg by mouth at bedtime as needed (for sleep).   Yes [provider]  methadone (DOLOPHINE) 10 MG/ML solution Take 155 mg by mouth in the morning.   Yes [provider]  rosuvastatin (CRESTOR) 10 MG tablet Take 1 tablet (10 mg total) by mouth daily. 11/10/21 11/10/22 Yes Adron Bene, MD  TUMS 500 MG chewable tablet Chew 1-2 tablets by mouth 3 (three) times daily as  needed for indigestion or heartburn.   Yes [provider]  TYLENOL 500 MG tablet Take 500-1,000 mg by mouth every 6 (six) hours as needed for mild pain or headache.   Yes [provider]  ARIPiprazole (ABILIFY) 10 MG tablet Take 1 tablet (10 mg total) by mouth at bedtime. Patient not taking: Reported on 07/15/2022 05/01/22     atomoxetine (STRATTERA) 25 MG capsule Take 1 capsule (25 mg total) by mouth every morning. Patient not taking: Reported on 07/15/2022 04/07/22     traZODone (DESYREL) 50 MG tablet Take 0.5 tablets (25 mg total) by mouth at bedtime as needed. Patient not taking: Reported on 07/15/2022 04/07/22     atorvastatin (LIPITOR) 40 MG tablet Take 1 tablet (40 mg total) by mouth daily. 03/10/21 11/10/21  Quincy Simmonds, MD      Allergies    Abilify [aripiprazole] and Trazodone and nefazodone    Review of Systems   Review of Systems  Cardiovascular:  Positive for chest pain.  All other systems reviewed and are negative.   Physical Exam Updated Vital Signs BP 112/87 (BP Location: Right Arm)   Pulse 81   Temp 97.9 F (36.6 C) (Oral)   Resp 17   SpO2 94%  Physical Exam Vitals and nursing note reviewed.  Constitutional:      General: He is not in acute distress.    Appearance: He is well-developed.  HENT:     Head: Normocephalic and atraumatic.  Mouth/Throat:     Pharynx: No oropharyngeal exudate.  Eyes:     General: No scleral icterus.       Right eye: No discharge.        Left eye: No discharge.     Conjunctiva/sclera: Conjunctivae normal.     Pupils: Pupils are equal, round, and reactive to light.  Neck:     Thyroid: No thyromegaly.     Vascular: No JVD.  Cardiovascular:     Rate and Rhythm: Normal rate and regular rhythm.     Heart sounds: Normal heart sounds. No murmur heard.    No friction rub. No gallop.  Pulmonary:     Effort: Pulmonary effort is normal. No respiratory distress.     Breath sounds: Normal breath sounds. No wheezing or  rales.  Abdominal:     General: Bowel sounds are normal. There is no distension.     Palpations: Abdomen is soft. There is no mass.     Tenderness: There is no abdominal tenderness.  Musculoskeletal:        General: No tenderness. Normal range of motion.     Cervical back: Normal range of motion and neck supple.     Right lower leg: No edema.     Left lower leg: No edema.  Lymphadenopathy:     Cervical: No cervical adenopathy.  Skin:    General: Skin is warm and dry.     Findings: No erythema or rash.  Neurological:     Mental Status: He is alert.     Coordination: Coordination normal.     Comments: Moves all extremities - soft compartments - supple joints.   Arms with some scattered bruising.  Psychiatric:     Comments: The patient appears aloof, he mumbles answers to questions, he is able to follow commands, he does not appear to endorse any suicidal ideation or homicidal ideation, he denies suicidality     ED Results / Procedures / Treatments   Labs (all labs ordered are listed, but only abnormal results are displayed) Labs Reviewed  COMPREHENSIVE METABOLIC PANEL - Abnormal; Notable for the following components:      Result Value   Potassium 3.2 (*)    Glucose, Bld 117 (*)    AST 55 (*)    All other components within normal limits  ETHANOL  CBC  RAPID URINE DRUG SCREEN, HOSP PERFORMED  CK  TROPONIN I (HIGH SENSITIVITY)    EKG EKG Interpretation  Date/Time:  Saturday July 15 2022 13:25:59 EDT Ventricular Rate:  82 PR Interval:  132 QRS Duration: 88 QT Interval:  366 QTC Calculation: 427 R Axis:   84 Text Interpretation: Normal sinus rhythm Normal ECG When compared with ECG of 03-May-2021 11:09, PREVIOUS ECG IS PRESENT Confirmed by Eber Hong ([id number]) on 07/15/2022 4:56:26 PM  Radiology No results found.  Procedures Procedures  {Document cardiac monitor, telemetry assessment procedure when appropriate:1}  Medications Ordered in ED Medications   ziprasidone (GEODON) injection 20 mg (20 mg Intramuscular Given 07/15/22 1515)  sterile water (preservative free) injection (10 mLs  Given 07/15/22 1548)    ED Course/ Medical Decision Making/ A&P   {   Click here for ABCD2, HEART and other calculatorsREFRESH Note before signing :1}                          Medical Decision Making Amount and/or Complexity of Data Reviewed Labs: ordered.   This patient appears to be cleared  medically, labs reviewed, he has an unremarkable metabolic panel, minimal hypokalemia, alcohol undetectable, CBC unremarkable.  Will add urine drug screen for completeness and psychiatry complaints the patient.  {Document critical care time when appropriate:1} {Document review of labs and clinical decision tools ie heart score, Chads2Vasc2 etc:1}  {Document your independent review of radiology images, and any outside records:1} {Document your discussion with family members, caretakers, and with consultants:1} {Document social determinants of health affecting pt's care:1} {Document your decision making why or why not admission, treatments were needed:1} Final Clinical Impression(s) / ED Diagnoses Final diagnoses:  None    Rx / DC Orders ED Discharge Orders     None

## 2022-07-15 NOTE — ED Provider Triage Note (Signed)
Emergency Medicine Provider Triage Evaluation Note  Victor Gomez , a 47 y.o. male  was evaluated in triage.  Pt complains of chest pain, sent from [facility] .  Review of Systems  Positive: agitation Negative: Fever, cp/dib  Physical Exam  BP 112/87 (BP Location: Right Arm)   Pulse 81   Temp 97.9 F (36.6 C) (Oral)   Resp 17   SpO2 94%  Gen:   Awake, no distress   Resp:  Normal effort  MSK:   Moves extremities without difficulty  Other:  Agitated, hyperverbal   Medical Decision Making  Medically screening exam initiated at 3:24 PM.  Appropriate orders placed.  ALANA DELO was informed that the remainder of the evaluation will be completed by another provider, this initial triage assessment does not replace that evaluation, and the importance of remaining in the ED until their evaluation is complete.  Patient sent for behavioral urgent care for medical clearance, patient agitated, combative.  Given Geodon IM with improvement to patient's status.     Tanda Rockers A, DO 07/15/22 1525

## 2022-07-15 NOTE — Discharge Instructions (Addendum)
Patient complaining of chest pain, contacted [facility] emergency department EDP Gwenlyn Fudge for acceptance and medical clearance

## 2022-07-15 NOTE — ED Notes (Signed)
Patient became increasingly paranoid and uncooperative, came out of his room multiple times and accused his sitter of tampering with his food, accused staff of stealing his belongings, and refused to cooperate with instructions. Provider made aware and orders entered for medication.

## 2022-07-15 NOTE — Consult Note (Signed)
  Chart Review:  Victor Gomez 46 y/o male With history of substance induced mood disorder, polysubstance abuse, alcohol abuse and depression was admitted to [facility] ED after transferring from [facility] where he initially presented under involuntary commitment.  Per IVC "Respondent suffers for bipolar and depression. Respondent thinks someone is trying to kill him. Respondent thinks someone is in the walls of the house, so he took a hammer and started busting the wall up in the living room. Respondent is on drugs, crack and meth. Respondent is a danger to himself at this time and needs to be evaluated for possible mental illness."  Patients' initial presentation at [facility] paranoid thinking under surveillance, neighbors out to get him, tactile hallucinations that he has chemical on legs causing to burn.    Outpatient psychiatric services with [facility] for therapy and medication management.  Current medications Gabapentin 100 mg, Abilify 5 mg, Wellbutrin 75 mg, and Adderall.  Unsure if patient is taking medications as prescribed.  Dr. Michele Rockers at methadone clinic Methadone 155 mg daily.  After arriving to [facility] ED patient continues to be paranoid.  Accusing sitter or tampering with food and stealing his belonging.  Has came out of room multiple times.      Plan:  Medical clearance in ED and to be reassessed by psychiatry tomorrow morning.    Medication management:  Restart Abilify 5 mg daily, Gabapentin 200 mg Tid, and Wellbutrin 75 mg daily.     Disposition:  Medical clearance.  Monitor in ED overnight.  Continue IVC, Psychiatry to reassess for appropriate placement tomorrow morning.    Assunta Found, NP

## 2022-07-15 NOTE — ED Triage Notes (Signed)
Pt BIB [facility] with [facility] from [facility] for CP. Patient reports doing meth for 4 days without sleep. Patient is A&O but paranoid and reports that he was poisoned by a chemical on the ground. VSS BP 152/90 HR 96 EMS attempted to administer 324mg  aspirin but patient may not have eaten 2 d/t paranoia.

## 2022-07-15 NOTE — ED Provider Notes (Signed)
Behavioral Health Urgent Care Medical Screening Exam  Patient Name: Victor Gomez MRN: [id number] Date of Evaluation: 07/15/22 Chief Complaint:   Diagnosis:  Final diagnoses:  Substance induced mood disorder (HCC)  Paranoia (HCC)    History of Present illness: Victor Gomez is a 47 y.o. male.  Victor Gomez is a 47 year old Caucasian male who presents to [facility] emergency department under involuntary commitment.  Per affidavit and petition " respondent suffers from bipolar and depression, respondent thinks someone tried to kill him.  Respondent thinks someone is in the walls of the house, so he took a hammer and started bus in the wall up in delivery room.  Responded some drugs, crack and meth.  Respondent is a danger to himself at this time and needs to be evaluated for possible mental illness."  Patient was seen and evaluated by this practitioner.  He is denying suicidal or homicidal ideations.  Denies auditory visual hallucinations.  Does report that he feels that people/ " my neighbors" are out to kill him.  He reports using illicit drugs earlier this morning roughly about 3 AM.  Does report some paranoia regarding a flip phone in a young lady that he was using substances with.  Patient reports he currently has nowhere to go " I am homeless"   Victor Gomez stated that patient has been bullying and she feels that he "Bulldozed."  Her into giving him money.  She reports plans to file an infection due to patient's bizarre behavior.  Reported that patient's father diagnosed with paranoia schizophrenia with psychosis. She reports that patient is currently followed by [facility] For methadone 155 mg daily dosing.  Reports he is currently prescribed Gabapentin 100 mg, Abilify 5 mg ( February), Wellbutrin 75 mg, Adderall. Dr Anitra Lauth.  However is unsure if patient has been taking medications as indicated. reported that patient has been followed by [facility] for therapy service weekly.     Victor Gomez is he is alert/oriented x 3; irritable, pressured and labile; patient presents disheveled and restless.  He has concerns related to chest pain and bilateral leg cramping. Patient is speaking in a clear tone at moderate volume, and normal pace; with good eye contact. His  thought process is coherent and relevant;  There is no indication that she is currently responding to internal/external stimuli or experiencing delusional thought content.  Patient denies suicidal/self-harm/homicidal ideation, psychosis  -Patient transferred to [facility] emergency department for medical clearance due to complaints of chest pain and leg cramping.   Flowsheet Row ED from 07/15/2022 in [facility] Counselor from 05/19/2021 in [facility] ED from 02/19/2018 in [facility] Emergency Department at [facility]  C-SSRS RISK CATEGORY No Risk No Risk No Risk       Psychiatric Specialty Exam  Presentation  General Appearance:Appropriate for Environment  Eye Contact:Good  Speech:Clear and Coherent  Speech Volume:Normal  Handedness:Right   Mood and Affect  Mood:Anxious; Irritable  Affect:Congruent   Thought Process  Thought Processes:Coherent  Descriptions of Associations:Intact  Orientation:Full (Time, Place and Person)  Thought Content:Paranoid Ideation  Diagnosis of Schizophrenia or Schizoaffective disorder in past: No  Duration of Psychotic Symptoms: N/A  Hallucinations:None  Ideas of Reference:Paranoia  Suicidal Thoughts:No  Homicidal Thoughts:No   Sensorium  Memory:Immediate Fair; Remote Poor  Judgment:Poor  Insight:Poor   Executive Functions  Concentration:Good  Attention Span:Fair  Recall:Fair  Fund of Knowledge:Fair  Language:Fair   Psychomotor Activity  Psychomotor Activity:Normal  Assets  Assets:Social Support   Sleep  Sleep:Fair  Number of hours: 3  Physical  Exam: Physical Exam Vitals and nursing note reviewed.  Constitutional:      Appearance: Normal appearance.  Cardiovascular:     Rate and Rhythm: Normal rate and regular rhythm.  Neurological:     Mental Status: He is alert and oriented to person, place, and time.  Psychiatric:        Mood and Affect: Mood normal.        Behavior: Behavior normal.    Review of Systems  Psychiatric/Behavioral:  Positive for hallucinations and substance abuse. The patient is nervous/anxious.   All other systems reviewed and are negative.  Blood pressure (!) 143/96, pulse 98, temperature 98.4 F (36.9 C), resp. rate 18, SpO2 95 %. There is no height or weight on file to calculate BMI.  Musculoskeletal: Strength & Muscle Tone: within normal limits Gait & Station: normal Patient leans: N/A   [facility] Discharge Disposition for Follow up and Recommendations: Based on my evaluation the patient appears to have an emergency medical condition for which I recommend the patient be transferred to the emergency department for further evaluation.    [name], NP 07/15/2022, 12:23 PM

## 2022-07-15 NOTE — ED Notes (Signed)
Patient was dc to [place] ed

## 2022-07-15 NOTE — Progress Notes (Signed)
   07/15/22 1132  [facility] Triage Screening (Walk-ins at [facility] only)  How Did You Hear About [place]? Family/Friend  What Is the Reason for Your Visit/Call Today? Victor Gomez is a 47 year old male presenting to [facility] under IVC reporting that he is not getting along with his mother. Patient reports he has been using meth and cocaine for the past three days and since using meth he has been seeing "crazy shit". Patient reports that when he uses meth it opens up a parallel dimension which allows him to see demons. Patient reports every time he uses meth it causes drama. Patient states that he has been using meth on and off for awhile and prior to three days ago his last time using meth was about 6 months ago. Patient reports last time he used meth he had to go into inpatient treatment. Patient denies SI, HI, AVH. Patient presents with psychomotor agitation, pressured speech, his thoughts are scattered and he is disorganized but cooperative.  How Long Has This Been Causing You Problems? 1 wk - 1 month  Have You Recently Had Any Thoughts About Hurting Yourself? No  Are You Planning to Commit Suicide/Harm Yourself At This time? No  Have you Recently Had Thoughts About Hurting Someone [name]? No  Are You Planning To Harm Someone At This Time? No  Are you currently experiencing any auditory, visual or other hallucinations? No  Have You Used Any Alcohol or Drugs in the Past 24 Hours? Yes  How long ago did you use Drugs or Alcohol? last night  What Did You Use and How Much? meth and cocaine  Do you have any current medical co-morbidities that require immediate attention? No  Clinician description of patient physical appearance/behavior: delusional  What Do You Feel Would Help You the Most Today? Treatment for Depression or other mood problem  If access to [facility] was not available, would you have sought care in the Emergency Department? No  Determination of Need Urgent (48 hours)  Options For Referral Medication  Management;Outpatient Therapy;BH Urgent Care

## 2022-07-16 DIAGNOSIS — F15959 Other stimulant use, unspecified with stimulant-induced psychotic disorder, unspecified: Secondary | ICD-10-CM | POA: Insufficient documentation

## 2022-07-16 DIAGNOSIS — F319 Bipolar disorder, unspecified: Secondary | ICD-10-CM | POA: Insufficient documentation

## 2022-07-16 MED ORDER — OLANZAPINE 10 MG IM SOLR: 5.0000 mg | Freq: Once | INTRAMUSCULAR | Status: DC

## 2022-07-16 MED ORDER — NICOTINE POLACRILEX 2 MG MT GUM: 2.0000 mg | CHEWING_GUM | OROMUCOSAL | Status: DC | PRN

## 2022-07-16 MED ORDER — HALOPERIDOL 5 MG PO TABS
5.0000 mg | ORAL_TABLET | Freq: Every day | ORAL | Status: DC
  Administered 2022-07-16 – 2022-07-17 (×2): 5 mg via ORAL
  Filled 2022-07-16 (×3): qty 1

## 2022-07-16 MED ORDER — ARIPIPRAZOLE 10 MG PO TABS: 5.0000 mg | ORAL_TABLET | Freq: Every day | ORAL | Status: DC

## 2022-07-16 MED ORDER — BUPROPION HCL 75 MG PO TABS
75.0000 mg | ORAL_TABLET | Freq: Every day | ORAL | Status: DC
  Administered 2022-07-17: 75 mg via ORAL
  Filled 2022-07-16 (×3): qty 1

## 2022-07-16 MED ORDER — POTASSIUM CHLORIDE CRYS ER 20 MEQ PO TBCR
40.0000 meq | EXTENDED_RELEASE_TABLET | Freq: Once | ORAL | Status: DC
  Filled 2022-07-16: qty 2

## 2022-07-16 MED ORDER — METHADONE HCL 10 MG/ML PO CONC
155.0000 mg | Freq: Every morning | ORAL | Status: DC
  Administered 2022-07-16: 155 mg via ORAL
  Filled 2022-07-16: qty 20

## 2022-07-16 MED ORDER — GABAPENTIN 100 MG PO CAPS
200.0000 mg | ORAL_CAPSULE | Freq: Three times a day (TID) | ORAL | Status: DC
  Administered 2022-07-16 – 2022-07-17 (×4): 200 mg via ORAL
  Filled 2022-07-16 (×4): qty 2

## 2022-07-16 NOTE — ED Provider Notes (Addendum)
Emergency Medicine Observation Re-evaluation Note  Victor Gomez is a 47 y.o. male, seen on rounds today.  Pt initially presented to the ED for complaints of Chest Pain Currently, the patient is waiting in the ED for psych disposition.  Physical Exam  BP 113/66 (BP Location: Right Arm)   Pulse 60   Temp 97.7 F (36.5 C) (Oral)   Resp 16   SpO2 97%  Physical Exam General: NAD, resting   ED Course / MDM  EKG:EKG Interpretation  Date/Time:  Saturday July 15 2022 13:25:59 EDT Ventricular Rate:  82 PR Interval:  132 QRS Duration: 88 QT Interval:  366 QTC Calculation: 427 R Axis:   84 Text Interpretation: Normal sinus rhythm Normal ECG When compared with ECG of 03-May-2021 11:09, PREVIOUS ECG IS PRESENT Confirmed by Eber Hong ([id number]) on 07/15/2022 4:56:26 PM  I have reviewed the labs performed to date as well as medications administered while in observation.  Recent changes in the last 24 hours include psych assessement.  Plan  Current plan is for restart medications, abilify, wellbutrin and neurontin as rescommended.  Plan for psych re Sherren Kerns, MD 07/16/22 [phone] Patient reevaluated by psychiatry.  They are going to find inpatient treatment   Linwood Dibbles, MD 07/16/22 1319

## 2022-07-16 NOTE — ED Provider Notes (Signed)
Emergency Medicine Observation Re-evaluation Note  Victor Gomez is a 47 y.o. male, seen on rounds today.  Pt initially presented to the ED for complaints of Chest Pain Currently, the patient is calm.  Physical Exam  BP 113/66 (BP Location: Right Arm)   Pulse 60   Temp 97.7 F (36.5 C) (Oral)   Resp 16   SpO2 97%  Physical Exam General: nad Lungs: no resp distress Psych: calm cooperative  ED Course / MDM  EKG:EKG Interpretation  Date/Time:  Saturday July 15 2022 13:25:59 EDT Ventricular Rate:  82 PR Interval:  132 QRS Duration: 88 QT Interval:  366 QTC Calculation: 427 R Axis:   84 Text Interpretation: Normal sinus rhythm Normal ECG When compared with ECG of 03-May-2021 11:09, PREVIOUS ECG IS PRESENT Confirmed by Eber Hong ([id number]) on 07/15/2022 4:56:26 PM  I have reviewed the labs performed to date as well as medications administered while in observation.  Recent changes in the last 24 hours include psych eval, pending inpatient UDS with cocaine/benzo/amphetamines, CPK +, Cr stable  K is low, will replace  Tolerating PO  Plan  Current plan is for placement     Sloan Leiter, DO 07/16/22 1932

## 2022-07-16 NOTE — ED Notes (Signed)
7a-3p rn reported that the pt refused his meds but never charted against these drugs  a sitter arrived around 1500  the pt seems to be better  if there is a sitter present

## 2022-07-16 NOTE — Consult Note (Cosign Needed Addendum)
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 male patient admitted with  Per RN Triage Nurse Note 07/15/2022@1310 : "Pt BIB [facility] from [facility] for CP. Patient reports doing meth for 4 days without sleep. Patient is A&O but paranoid and reports that he was poisoned by a chemical on the ground. VSS BP 152/90 HR 96 EMS attempted to administer 324mg  aspirin but patient may not have eaten 2 d/t paranoia."  Per Involuntary Commitment: "Respondent suffers for bipolar and depression. Respondent thinks someone is trying to kill him. Respondent thinks someone is in the walls of the house, so he took a hammer and started busting the wall up in the living room. Respondent is on drugs, crack and meth. Respondent is a danger to himself at this time and needs to be evaluated for possible mental illness."    HPI:   Patient seen via telepsych by this provider; chart reviewed and consulted with Dr. Jannifer Franklin on 07/16/22.  On evaluation Victor Gomez is seen sitting in triage exam room alone, he appears nervous and frequently looking around the room.  Pt appears is bizarre as he makes minimal eye contact with this Clinical research associate but frequently looks around the room.  He hair is not combed and he appears dis shelved wearing mismatched hospital scrubs.  He was temporarily placed there to completed the psych assessment.  Pt greeted by  this Clinical research associate and anticipatory guidance given.  Pt agrees to interview and begins rambling about many unrelated topics.  Pt alert and oriented x3;  "I don't feel safe here.  There are too many people in here, watching me and plotting to get me. They want to hurt me." Pt states he overheard the nurse talking with another staff member about putting something in his sandwich to kill him.  Pt states for above reason, he will not eat the food or take some of the medications offered to him.  He states he's prescribed abilify but does not take it because he doesn't like the way it makes him feel; pt refuses wellbutrin with no explanation.  He takes methadone outpatient for opioid abuse, did accept methadone and gabapentin when offered to him today.  He is very disorganized but cooperates with assessment.  Pt provided therapeutic support and reassurance that staff are here to help him.   When asked what brought him to the hospital, Pt reports prior to admission, his mother allowed people to set up surveillance in the house to watch him. When asked why he thinks his mother would do that he reports, "because she is in with the court system. The last time I went to a mental  health facility, I was treated like a sex offender although I never did anything."  He states he uses methadone, methamphetamine and heroine and his mother wants him to stop.  States he's prescribed methadone for drug abuse and chronic leg pain.     Labs: UDS was positive for amphetamines, benzodiazepines and cocaine  CMP:  -K was low at 3.2-but received replacement  -Blood sugar was elevated at 117mg /dl but pt was not fasting -LFTs within normal limits except AST which was mildly elevated at 55U/L.  CBC - WNL no leukocytosis EKG: 366/427 NSR Pt was medically cleared prior to TTS assessment.   Nursing notes and PDMP reviewed.   During evaluation Victor Gomez is sitting in a chair in the triage room, alone; He is alert/oriented x 3;  anxious, but cooperative; and mood congruent with affect.  Patient speech is pressured and it difficult to interrupt him to provide information or answer his questions.  He makes minimal eye contact today;   His  thought process is disorganized and thought content is illogical, of paranoid ideation.  Pt denies AVH but appears to be responding to internal stimulus. Patient denies suicidal/self-harm/homicidal ideation.  Patient has remained anxious but cooperative throughout assessment and has answered questions appropriately.    Per ED Provider Admission Assessment Note 07/15/2022: Chest Pain   This patient is a 47 year old male, no specific cardiac history, the patient has not had any cardiac testing within our system, he does have a history of some psychiatric disorders and takes Abilify, Strattera and methadone.  The patient had endorsed using drugs this morning at 3 AM, presented to the behavioral health urgent care with complaints of paranoid schizophrenia and hitting a wall with a hammer, sent here because of having chest pain, denies chest pain at this time, endorses restless legs.   The patient was sent here for evaluation of the chest pain prior to being seen for a psychiatric reason.  Evidently the patient is under involuntary commitment because of his abnormal behavior   Past Psychiatric History:  Bipolar Disorder Polysubstance Abuse  Risk to Self:  yes Risk to Others:  yes Prior Inpatient Therapy: yes  Prior Outpatient Therapy:  deferred  Past Medical History:  Past Medical History:  Diagnosis Date   Hepatitis C    Methadone use    clinic each day    Past Surgical History:  Procedure Laterality Date   HERNIA REPAIR     Family History: History reviewed. No pertinent family history. Family Psychiatric  History: deferred Social History:  Social History   Substance and Sexual Activity  Alcohol Use Yes     Social History   Substance and Sexual Activity  Drug Use Not  Currently   Types: Marijuana, "Crack" cocaine, Methamphetamines   Comment: last time he used 04/2014    Social History   Socioeconomic History   Marital status: Legally Separated    Spouse name: Not on file   Number of children: Not on file   Years of education: Not on file   Highest education level: Not on file  Occupational History   Not on file  Tobacco Use   Smoking status: Every Day    Packs/day: .5    Types: Cigarettes   Smokeless tobacco: Never   Tobacco comments:    1 pack every 3 days   Vaping Use   Vaping Use: Never used  Substance and Sexual Activity   Alcohol use: Yes   Drug use: Not Currently    Types: Marijuana, "Crack" cocaine, Methamphetamines    Comment: last time he used 04/2014   Sexual activity: Not on file  Other Topics Concern   Not on file  Social History Narrative   Not on file   Social Determinants of Health   Financial Resource Strain: Not on file  Food Insecurity: Not on file  Transportation Needs: Not on file  Physical Activity: Not on file  Stress: Not on file  Social Connections: Not on file   Additional Social History:    Allergies:   Allergies  Allergen Reactions   Abilify [Aripiprazole] Other (See Comments)    Made the patient feel like he was experiencing "withdrawal symptoms"   Trazodone And Nefazodone Other (See Comments)    Made the patient feel "hungover" the day after taking it    Labs:  Results for orders placed or performed during the hospital encounter of 07/15/22 (from the past 48 hour(s))  Rapid urine drug screen (hospital performed)     Status: Abnormal   Collection Time: 07/15/22  2:14 PM  Result Value Ref Range   Opiates NONE DETECTED NONE DETECTED   Cocaine POSITIVE (A) NONE DETECTED   Benzodiazepines POSITIVE (A) NONE DETECTED   Amphetamines POSITIVE (A) NONE DETECTED   Tetrahydrocannabinol NONE DETECTED NONE DETECTED   Barbiturates NONE DETECTED NONE DETECTED    Comment: (NOTE) DRUG SCREEN FOR MEDICAL  PURPOSES ONLY.  IF CONFIRMATION IS NEEDED FOR ANY PURPOSE, NOTIFY LAB WITHIN 5 DAYS.  LOWEST DETECTABLE LIMITS FOR URINE DRUG SCREEN Drug Class                     Cutoff (ng/mL) Amphetamine and metabolites    1000 Barbiturate and metabolites    200 Benzodiazepine                 200 Opiates and metabolites        300 Cocaine and metabolites        300 THC                            50 Performed at [facility], [address], [place], [place] [address]   Comprehensive metabolic panel     Status: Abnormal   Collection Time: 07/15/22  4:00 PM  Result Value Ref Range   Sodium 139 135 - 145 mmol/L   Potassium 3.2 (L) 3.5 - 5.1 mmol/L   Chloride 103 98 - 111 mmol/L   CO2 23 22 - 32 mmol/L   Glucose, Bld 117 (H) 70 - 99 mg/dL    Comment: Glucose reference range applies only to samples taken after fasting for at least 8 hours.   BUN 13 6 - 20 mg/dL   Creatinine, Ser 6.04 0.61 - 1.24 mg/dL   Calcium 9.3 8.9 - 54.0 mg/dL   Total Protein 6.9 6.5 - 8.1 g/dL   Albumin 4.1 3.5 - 5.0 g/dL   AST 55 (H) 15 - 41 U/L   ALT 18 0 - 44 U/L   Alkaline Phosphatase 81 38 - 126 U/L   Total Bilirubin 0.8 0.3 - 1.2 mg/dL   GFR, Estimated >98 >11 mL/min    Comment: (NOTE) Calculated using the CKD-EPI Creatinine Equation (2021)    Anion gap 13 5 - 15    Comment: Performed at [facility], [address] [place], [place] [address]  Ethanol     Status: None   Collection Time: 07/15/22  4:00 PM  Result Value Ref Range   Alcohol, Ethyl (B) <10 <10 mg/dL    Comment: (NOTE) Lowest detectable limit for serum alcohol is 10 mg/dL.  For medical purposes only. Performed at [facility], [address] [place], [place] [address]   cbc  Status: None   Collection Time: 07/15/22  4:00 PM  Result Value Ref Range   WBC 8.7 4.0 - 10.5 K/uL   RBC 5.36 4.22 - 5.81 MIL/uL   Hemoglobin 15.2 13.0 - 17.0 g/dL   HCT 16.1 09.6 - 04.5 %   MCV 86.8 80.0 - 100.0 fL   MCH 28.4 26.0 - 34.0  pg   MCHC 32.7 30.0 - 36.0 g/dL   RDW 40.9 81.1 - 91.4 %   Platelets 196 150 - 400 K/uL   nRBC 0.0 0.0 - 0.2 %    Comment: Performed at [facility], [address], [place], [place] [address]  Troponin I (High Sensitivity)     Status: None   Collection Time: 07/15/22  6:00 PM  Result Value Ref Range   Troponin I (High Sensitivity) 6 <18 ng/L    Comment: (NOTE) Elevated high sensitivity troponin I (hsTnI) values and significant  changes across serial measurements may suggest ACS but many other  chronic and acute conditions are known to elevate hsTnI results.  Refer to the "Links" section for chest pain algorithms and additional  guidance. Performed at [facility], [address], [place], [place] [address]   CK     Status: Abnormal   Collection Time: 07/15/22  6:00 PM  Result Value Ref Range   Total CK 1,413 (H) 49 - 397 U/L    Comment: Performed at [facility], [address] [place], [place] [address]    Medications:  Current Facility-Administered Medications  Medication Dose Route Frequency Provider Last Rate Last Admin   buPROPion [facility]) tablet 75 mg  75 mg Oral Daily Linwood Dibbles, MD       gabapentin (NEURONTIN) capsule 200 mg  200 mg Oral TID Linwood Dibbles, MD   200 mg at 07/16/22 1031   haloperidol (HALDOL) tablet 5 mg  5 mg Oral Daily Ophelia Shoulder E, NP       methadone (DOLOPHINE) 10 MG/ML solution 155 mg  155 mg Oral q AM Linwood Dibbles, MD   155 mg at 07/16/22 1032   nicotine polacrilex (NICORETTE) gum 2 mg  2 mg Oral PRN Chales Abrahams, NP       potassium chloride SA (KLOR-CON M) CR tablet 40 mEq  40 mEq Oral Once Sloan Leiter, DO       Current Outpatient Medications  Medication Sig Dispense Refill   diphenhydrAMINE (BENADRYL ALLERGY) 25 MG tablet Take 25 mg by mouth at bedtime as needed for sleep.     docusate sodium (COLACE) 100 MG capsule Take 600 mg by mouth daily.     gabapentin (NEURONTIN) 100 MG capsule Take 2 capsules (200 mg total) by  mouth 3 (three) times daily. 180 capsule 2   Melatonin 10 MG TABS Take 10 mg by mouth at bedtime as needed (for sleep).     methadone (DOLOPHINE) 10 MG/ML solution Take 155 mg by mouth in the morning.     rosuvastatin (CRESTOR) 10 MG tablet Take 1 tablet (10 mg total) by mouth daily. 30 tablet 11   TUMS 500 MG chewable tablet Chew 1-2 tablets by mouth 3 (three) times daily as needed for indigestion or heartburn.     TYLENOL 500 MG tablet Take 500-1,000 mg by mouth every 6 (six) hours as needed for mild pain or headache.     ARIPiprazole (ABILIFY) 10 MG tablet Take 1 tablet (10 mg total) by mouth at bedtime. (Patient not taking:  Reported on 07/15/2022) 30 tablet 2   atomoxetine (STRATTERA) 25 MG capsule Take 1 capsule (25 mg total) by mouth every morning. (Patient not taking: Reported on 07/15/2022) 30 capsule 2   traZODone (DESYREL) 50 MG tablet Take 0.5 tablets (25 mg total) by mouth at bedtime as needed. (Patient not taking: Reported on 07/15/2022) 15 tablet 2    Musculoskeletal: pt moves all extremities and ambulates independently.  Strength & Muscle Tone: within normal limits Gait & Station: normal Patient leans: N/A     Psychiatric Specialty Exam:  Presentation  General Appearance:  Bizarre; Disheveled  Eye Contact: Minimal  Speech: Pressured  Speech Volume: Normal  Handedness: Right   Mood and Affect  Mood: Anxious; Dysphoric  Affect: Blunt; Congruent   Thought Process  Thought Processes: Disorganized  Descriptions of Associations:Intact  Orientation:Full (Time, Place and Person)  Thought Content:Illogical; Paranoid Ideation  History of Schizophrenia/Schizoaffective disorder:No  Duration of Psychotic Symptoms:N/A  Hallucinations:Hallucinations: None  Ideas of Reference:Delusions; Paranoia  Suicidal Thoughts:Suicidal Thoughts: No  Homicidal Thoughts:Homicidal Thoughts: No   Sensorium  Memory: Immediate Fair; Remote Fair; Recent  Fair  Judgment: Poor  Insight: Poor   Executive Functions  Concentration: Poor  Attention Span: Poor  Recall: Fair  Fund of Knowledge: Fair  Language: Good   Psychomotor Activity  Psychomotor Activity: Psychomotor Activity: Increased   Assets  Assets: Communication Skills; Desire for Improvement; Housing; Financial Resources/Insurance   Sleep  Sleep: Sleep: Poor Number of Hours of Sleep: 4    Physical Exam: Physical Exam Vitals and nursing note reviewed.  Cardiovascular:     Rate and Rhythm: Normal rate.  Musculoskeletal:        General: Normal range of motion.     Cervical back: Normal range of motion.  Neurological:     Mental Status: He is alert and oriented to person, place, and time.  Psychiatric:        Mood and Affect: Mood is anxious. Affect is blunt.        Speech: Speech is rapid and pressured.        Behavior: Behavior is hyperactive. Behavior is cooperative.        Thought Content: Thought content is paranoid and delusional. Thought content does not include homicidal or suicidal ideation. Thought content does not include homicidal or suicidal plan.        Cognition and Memory: Cognition and memory normal.        Judgment: Judgment is impulsive and inappropriate.    Review of Systems  Constitutional: Negative.   HENT: Negative.    Eyes: Negative.   Respiratory: Negative.    Cardiovascular: Negative.   Gastrointestinal: Negative.   Genitourinary: Negative.   Musculoskeletal: Negative.   Skin: Negative.   Neurological: Negative.   Endo/Heme/Allergies: Negative.   Psychiatric/Behavioral:  Positive for substance abuse. The patient is nervous/anxious.    Blood pressure 113/66, pulse 60, temperature 97.7 F (36.5 C), temperature source Oral, resp. rate 16, SpO2 97 %. There is no height or weight on file to calculate BMI.  Treatment Plan Summary: Pt [facility] to [facility] under IVC for mental decompensation; pt reported chest pain and was  transferred to [facility] for medication clearance.  Pt initially evaluated by psychiatric one day prior and recommended for overnight observation and AM psychiatry reassessment.  Pt has a hx for bipolar disorder, polysubstance abuse and endorses non-adherence to psychiatric medications.   On assessment today, pt is oriented to self and location, thinks today is Saturday June 1st  but aware of current year.  He is delusional, and paranoid, flight of ideas and pressured speech.  His admission UDS was positive for amphetamines, benzodiazepines and cocaine, which appears to exacerbate his mental health concerns.  Believe his presentation today is related to methamphetamine induced psychosis versus bipolar disorder. In any case, he lacks capacity to make sound decisions and lacks insight which makes him a high risk safety concern.  He will need referral for inpatient admission to get restarted on medications and where he can be monitored for safety and mood stability.  He's been in the emergency department for over 24hrs and restarted on his medications, but has refused abilify and wellbutrin; has agreed to take methadone and gabapentin.  In the absence of abilify he would benefit from adding an antipsychotic medication to help with bipolar and drug induced symptoms. This was discussed with patient who nodded in agreement but d/t pressured speech,not clear is he heard the explanation.   EKG- 366/427 no prolonged QT intervals  Medications: Bupropion 75mg  po daily for depression Gabapentin 200mg  po TID Methadone (dolophine 10mg /ml solution) 155mg  qam for opioid abuse/chronic pain Start: Haldol 5mg  po dai ly for mood Stop Abilify as pt refuses to take it citing intolerable side effects   Disposition: Pt meets criteria for inpatient psychiatric admission.  Per Sharyne Peach, [facility] AC, there are no beds at [facility]. Staci Acosta, LCSW to fax him out.    Spoke with Dr. Linwood Dibbles, ED Provider; Nonie Hoyer,  RN; Staci Acosta, LCSW and Antoinette Cillo,[facility]-AC were all informed of above recommendation and disposition via secure chat.   This service was provided via telemedicine using a 2-way, interactive audio and video technology.  Names of all persons participating in this telemedicine service and their role in this encounter. Name: Ryze Backe Role: Patient  Name: Ophelia Shoulder Role: PMHNP  Name: Thedore Mins Role: Psychiatrist    Chales Abrahams, NP 07/16/2022 3:15 PM

## 2022-07-16 NOTE — Progress Notes (Signed)
Inpatient Behavioral Health Placement  @1300  CSW was advised to send to out of network providers. Pt meets inpatient behavioral health placement per [facility]. There are no available beds at [facility] per [facility] Sharyne Peach, RN.    Destination  Service Provider Address Phone [facility]  [address] [place] [address] [phone]  [facility]  [address] [place], [place] [address] [phone]  [facility]  [address], [place] [address] [phone]  [facility]  [address] [place], [place] [address] [phone]  [facility] [place] [address] [phone]  [facility]  [address] [place] [address] [phone]  [facility]  [address] [place] [address] [phone]  [facility]  [address] [place], [place] [address] [phone]  [facility]  [address] [place] [address] [phone]  [facility]  [address] [place] [address] [phone]  [facility]  [address], [place] [address] [phone]  [facility]  [address] [place] [address] [phone]  [facility]  [address], [place] [address] [phone]  [facility]  [address], [place] [address] [phone]  [facility]  [address] [place] [address] [phone]  [facility]  [address] [place] [address] [phone]  [phone]  [facility]  [address] [place] [address] [phone]  [facility]  [address], [place] [address] [phone]  [facility]  [address], [place] [address] [phone]  [facility]  [address] [place] [address] [phone]  [facility]  [address] [place] [address] [phone]  [facility]  [address] [place] [address] [phone]  [facility]  [address] [place] [address] [phone]  [facility]  [address] [place] [address] [phone]     Maryjean Ka, MSW, [facility] 07/16/2022 2:48 PM

## 2022-07-16 NOTE — ED Notes (Signed)
Pt walked out of ambulance bay, security notified.

## 2022-07-16 NOTE — ED Notes (Signed)
Mother would like MD to contact her if possible

## 2022-07-16 NOTE — ED Notes (Signed)
Pt is cooperative taking his medications at this time. Sitting in room

## 2022-07-17 ENCOUNTER — Encounter (HOSPITAL_COMMUNITY): Payer: Self-pay

## 2022-07-17 DIAGNOSIS — F15959 Other stimulant use, unspecified with stimulant-induced psychotic disorder, unspecified: Secondary | ICD-10-CM

## 2022-07-17 MED ORDER — POTASSIUM CHLORIDE CRYS ER 20 MEQ PO TBCR
40.0000 meq | EXTENDED_RELEASE_TABLET | Freq: Once | ORAL | Status: AC
  Administered 2022-07-17: 40 meq via ORAL
  Filled 2022-07-17: qty 2

## 2022-07-17 MED ORDER — HALOPERIDOL 5 MG PO TABS: 10.0000 mg | ORAL_TABLET | Freq: Every day | ORAL | Status: DC

## 2022-07-17 MED ORDER — METHADONE HCL 10 MG PO TABS
155.0000 mg | ORAL_TABLET | Freq: Every day | ORAL | Status: DC
  Administered 2022-07-17: 155 mg via ORAL
  Filled 2022-07-17: qty 16

## 2022-07-17 NOTE — Progress Notes (Addendum)
LCSW Progress Note  [id number]   Victor Gomez  07/17/2022  9:40 AM  Description:   Inpatient Psychiatric Referral  Patient was recommended inpatient per [name], NP. There are no available beds at [facility], per [facility] Rona Ravens, RN. Patient was referred to the following out of network facilities:   [facility] Provider Address Phone Fax  [facility]  [address] [place] [address] [phone]  [facility]  [address] [place], [place] [address] [phone]  [facility]  [address] [place] [address] [phone]  [facility]  [address] [place], [place] [address] [phone]  [facility] [place] [address] [phone]  [facility]  [address], [place] [address] [phone]  [facility]  [address] [place] [address] [phone]  [facility]  [address] [place], [place] [address] [phone]  [facility]  [address] [place] [address] [phone]  [facility]  [address] [place] [address] [phone]  [facility]  [address], [place] [address] [phone]  [facility]  [address] [place] [address] [phone]  [facility]  [address], [place] [address] [phone]  [facility]  [address], [place] [address] [phone]  [facility]  [address], [place] [address] [phone]  [facility]  [address] [place] [address] [phone]  [facility]  [address] [place] [address] [phone]  [facility]  [address], [place] [address] [phone]  [facility] Office  [address] [place] [address] [phone]  [facility]  [address] [place] [address] [phone]  [facility]  [address] [place] [address] [phone]  [facility]  [address], [place] [address] [phone]  [facility]  [address] [place] [address] [phone]  [facility]  [address] [place] [address] [phone]    Situation ongoing, CSW to continue following and update chart as more information becomes available.      Cathie Beams, LCSW  07/17/2022 9:40 AM

## 2022-07-17 NOTE — ED Notes (Signed)
Pt ambulated out w/ PD. Gait steady and VSS at time of departure. All belongings and paperwork given to PD.

## 2022-07-17 NOTE — Discharge Instructions (Addendum)
Transfer to [facility] behavioral health unit.  Drink plenty of fluids/stay well hydrated. Your potassium level is mildly low - eat plenty of fruits and vegetables, and follow up with primary care doctor in 1-2 weeks.

## 2022-07-17 NOTE — ED Provider Notes (Signed)
Emergency Medicine Observation Re-evaluation Note  Victor Gomez is a 47 y.o. male, seen on rounds today.  Pt initially presented to the ED for complaints of Chest Pain Currently, the patient is resting comfortably is interactive and conversant.  Physical Exam  BP 112/71 (BP Location: Left Arm)   Pulse 76   Temp 97.9 F (36.6 C) (Oral)   Resp 18   SpO2 97%  Physical Exam General: Well-developed well-nourished male sitting in the bed does not appear in acute distress Cardiac: Heart is regular rate and rhythm with normal blood pressure Lungs: Normal respiratory rate with normal oxygen saturations Psych: Patient states he does not have any's homicidal or suicidal ideation and denies hearing voices at this time.  He states that his mother committed him because she "exaggerates things.  He admits to using methamphetamines although he states that he prefers cocaine  ED Course / MDM  EKG:EKG Interpretation  Date/Time:  Saturday July 15 2022 13:25:59 EDT Ventricular Rate:  82 PR Interval:  132 QRS Duration: 88 QT Interval:  366 QTC Calculation: 427 R Axis:   84 Text Interpretation: Normal sinus rhythm Normal ECG When compared with ECG of 03-May-2021 11:09, PREVIOUS ECG IS PRESENT Confirmed by Eber Hong ([id number]) on 07/15/2022 4:56:26 PM  I have reviewed the labs performed to date as well as medications administered while in observation.  Recent changes in the last 24 hours include [facility]  Patient has a history of bipolar disorder.  Is reported that he has been using stimulants and increased paranoia and was hammering at a wall.  At the behavioral or urgent care he complained of some chest pain and was sent here for further evaluation.  EKG was obtained and was normal troponin within normal limits.  He was noted to have some total CK elevation at 1400 consistent with mania and probable muscle breakdown. Patient has been stable here in the ED and  is taking p.o. without difficulty he is no longer having chest pain.  Plan  Current plan is for psychiatry has seen and evaluated and feels that he meets inpatient psychiatric admission criteria.  Social work is attempting to find behavioral health bed.Margarita Grizzle, MD 07/17/22 [phone]

## 2022-07-17 NOTE — ED Provider Notes (Signed)
Patient evaluated by  [facility] and inpatient BH bed at [facility] accepting provider.   Pt alert, content, calm, no acute distress. Vitals normal. Pt eating and drinking. No chest pain or sob.   Pt currently appears stable for transport/transfer.      Cathren Laine, MD 07/17/22 [phone]

## 2022-07-17 NOTE — ED Notes (Signed)
Pt got up to use the restroom.

## 2022-07-17 NOTE — ED Notes (Signed)
Notified Diplomatic Services operational officer to arrange transport for pt going to [facility]

## 2022-07-17 NOTE — Progress Notes (Signed)
Pt was accepted to [facility] TODAY 07/17/2022. Bed assignment: [facility] Unit  Pt meets inpatient criteria per [name], NP  Attending Physician will be Joylene Igo, AGPCNP  Report can be called to: [phone]  Pt can arrive after 3 PM  Care Team Notified: [name], NP and Denton Ar, RN  Cathie Beams, LCSW  07/17/2022 12:18 PM

## 2022-07-17 NOTE — ED Notes (Signed)
Explained to pt that taking all of his medications is one of the ways to be able to get through his inpatient treatment and be able to be discharged. Pt initially not agreeable, but then stated, "If it will help me get out of here faster, then I will take them."

## 2022-07-17 NOTE — ED Notes (Signed)
Pt is back in his room

## 2022-07-17 NOTE — ED Notes (Signed)
Messaged pharmacy about sending meds

## 2022-07-17 NOTE — Progress Notes (Signed)
[facility] Psych ED Progress Note  07/17/2022 11:43 AM ARYN HAFFEY  MRN:  [id number]   Subjective:   [facility] seen at [facility], ED for face-to-face psychiatric reevaluation.  Per ED nurse he has been agitated and slightly uncooperative with staff today.  He was compliant with all morning medications today.  During assessment patient continues to express paranoid delusions.  He states he has been living with his mom, and his mom even told him people were in the house investigating and people are after him.  Patient continues to make paranoid statements during assessment.  He does deny any suicidal or homicidal ideations.  Denies any auditory or visual hallucinations.  During conversation he does not appear to be responding to internal stimuli.  His speech continues to be rapid and pressured. He remains disorganized and tangential. At this time patient continues to meet IVC criteria and will continue to recommend inpatient psychiatric treatment.  There is no current availability at [facility], will have CSW fax out.  Principal Problem: Methamphetamine-induced psychotic disorder (HCC) Diagnosis:  Principal Problem:   Methamphetamine-induced psychotic disorder (HCC) Active Problems:   Polysubstance abuse (HCC)   Psychoactive substance-induced mood disorder (HCC)   Bipolar disorder (HCC)   ED Assessment Time Calculation: Start Time: 1100 Stop Time: 1130 Total Time in Minutes (Assessment Completion): 30   [place] Scale:  Flowsheet Row ED from 07/15/2022 in [facility] at [facility] Most recent reading at 07/15/2022  6:08 PM ED from 07/15/2022 in [facility] Most recent reading at 07/15/2022 11:38 AM Counselor from 05/19/2021 in [facility] Most recent reading at 05/22/2021 11:22 AM  C-SSRS RISK CATEGORY No Risk No Risk No Risk       Past Medical History:  Past Medical History:  Diagnosis Date   ADHD    Hepatitis C     Hyperlipidemia    MDD (major depressive disorder)    Methadone use    clinic each day   Psychoactive substance-induced psychosis (HCC)    Substance induced mood disorder (HCC)     Past Surgical History:  Procedure Laterality Date   HERNIA REPAIR     Family History: History reviewed. No pertinent family history.  Social History:  Social History   Substance and Sexual Activity  Alcohol Use Yes     Social History   Substance and Sexual Activity  Drug Use Not Currently   Types: Marijuana, "Crack" cocaine, Methamphetamines, Heroin   Comment: last time he used 04/2014    Social History   Socioeconomic History   Marital status: Legally Separated    Spouse name: Not on file   Number of children: Not on file   Years of education: Not on file   Highest education level: Not on file  Occupational History   Not on file  Tobacco Use   Smoking status: Every Day    Packs/day: .5    Types: Cigarettes   Smokeless tobacco: Never   Tobacco comments:    1 pack every 3 days   Vaping Use   Vaping Use: Never used  Substance and Sexual Activity   Alcohol use: Yes   Drug use: Not Currently    Types: Marijuana, "Crack" cocaine, Methamphetamines, Heroin    Comment: last time he used 04/2014   Sexual activity: Not on file  Other Topics Concern   Not on file  Social History Narrative   Not on file   Social Determinants of Health  Financial Resource Strain: Not on file  Food Insecurity: Not on file  Transportation Needs: Not on file  Physical Activity: Not on file  Stress: Not on file  Social Connections: Not on file    Sleep: Fair  Appetite:  Fair  Current Medications: Current Facility-Administered Medications  Medication Dose Route Frequency Provider Last Rate Last Admin   buPROPion (WELLBUTRIN) tablet 75 mg  75 mg Oral Daily Linwood Dibbles, MD   75 mg at 07/17/22 1009   gabapentin (NEURONTIN) capsule 200 mg  200 mg Oral TID Linwood Dibbles, MD   200 mg at 07/17/22 0941    haloperidol (HALDOL) tablet 5 mg  5 mg Oral Daily Ophelia Shoulder E, NP   5 mg at 07/17/22 1009   methadone (DOLOPHINE) tablet 155 mg  155 mg Oral Daily Tanda Rockers A, DO   155 mg at 07/17/22 1009   nicotine polacrilex (NICORETTE) gum 2 mg  2 mg Oral PRN Chales Abrahams, NP       Current Outpatient Medications  Medication Sig Dispense Refill   diphenhydrAMINE (BENADRYL ALLERGY) 25 MG tablet Take 25 mg by mouth at bedtime as needed for sleep.     docusate sodium (COLACE) 100 MG capsule Take 600 mg by mouth daily.     gabapentin (NEURONTIN) 100 MG capsule Take 2 capsules (200 mg total) by mouth 3 (three) times daily. 180 capsule 2   Melatonin 10 MG TABS Take 10 mg by mouth at bedtime as needed (for sleep).     methadone (DOLOPHINE) 10 MG/ML solution Take 155 mg by mouth in the morning.     rosuvastatin (CRESTOR) 10 MG tablet Take 1 tablet (10 mg total) by mouth daily. 30 tablet 11   TUMS 500 MG chewable tablet Chew 1-2 tablets by mouth 3 (three) times daily as needed for indigestion or heartburn.     TYLENOL 500 MG tablet Take 500-1,000 mg by mouth every 6 (six) hours as needed for mild pain or headache.     ARIPiprazole (ABILIFY) 10 MG tablet Take 1 tablet (10 mg total) by mouth at bedtime. (Patient not taking: Reported on 07/15/2022) 30 tablet 2   atomoxetine (STRATTERA) 25 MG capsule Take 1 capsule (25 mg total) by mouth every morning. (Patient not taking: Reported on 07/15/2022) 30 capsule 2   traZODone (DESYREL) 50 MG tablet Take 0.5 tablets (25 mg total) by mouth at bedtime as needed. (Patient not taking: Reported on 07/15/2022) 15 tablet 2    Lab Results:  Results for orders placed or performed during the hospital encounter of 07/15/22 (from the past 48 hour(s))  Rapid urine drug screen (hospital performed)     Status: Abnormal   Collection Time: 07/15/22  2:14 PM  Result Value Ref Range   Opiates NONE DETECTED NONE DETECTED   Cocaine POSITIVE (A) NONE DETECTED   Benzodiazepines POSITIVE (A)  NONE DETECTED   Amphetamines POSITIVE (A) NONE DETECTED   Tetrahydrocannabinol NONE DETECTED NONE DETECTED   Barbiturates NONE DETECTED NONE DETECTED    Comment: (NOTE) DRUG SCREEN FOR MEDICAL PURPOSES ONLY.  IF CONFIRMATION IS NEEDED FOR ANY PURPOSE, NOTIFY LAB WITHIN 5 DAYS.  LOWEST DETECTABLE LIMITS FOR URINE DRUG SCREEN Drug Class                     Cutoff (ng/mL) Amphetamine and metabolites    1000 Barbiturate and metabolites    200 Benzodiazepine  200 Opiates and metabolites        300 Cocaine and metabolites        300 THC                            50 Performed at [facility], [address] [place], [place] [address]   Comprehensive metabolic panel     Status: Abnormal   Collection Time: 07/15/22  4:00 PM  Result Value Ref Range   Sodium 139 135 - 145 mmol/L   Potassium 3.2 (L) 3.5 - 5.1 mmol/L   Chloride 103 98 - 111 mmol/L   CO2 23 22 - 32 mmol/L   Glucose, Bld 117 (H) 70 - 99 mg/dL    Comment: Glucose reference range applies only to samples taken after fasting for at least 8 hours.   BUN 13 6 - 20 mg/dL   Creatinine, Ser 6.04 0.61 - 1.24 mg/dL   Calcium 9.3 8.9 - 54.0 mg/dL   Total Protein 6.9 6.5 - 8.1 g/dL   Albumin 4.1 3.5 - 5.0 g/dL   AST 55 (H) 15 - 41 U/L   ALT 18 0 - 44 U/L   Alkaline Phosphatase 81 38 - 126 U/L   Total Bilirubin 0.8 0.3 - 1.2 mg/dL   GFR, Estimated >98 >11 mL/min    Comment: (NOTE) Calculated using the CKD-EPI Creatinine Equation (2021)    Anion gap 13 5 - 15    Comment: Performed at [facility], [address], [place], [place] [address]  Ethanol     Status: None   Collection Time: 07/15/22  4:00 PM  Result Value Ref Range   Alcohol, Ethyl (B) <10 <10 mg/dL    Comment: (NOTE) Lowest detectable limit for serum alcohol is 10 mg/dL.  For medical purposes only. Performed at [facility], [address], [place], [place] [address]   cbc     Status: None   Collection Time: 07/15/22  4:00 PM   Result Value Ref Range   WBC 8.7 4.0 - 10.5 K/uL   RBC 5.36 4.22 - 5.81 MIL/uL   Hemoglobin 15.2 13.0 - 17.0 g/dL   HCT 13.0 86.5 - 78.4 %   MCV 86.8 80.0 - 100.0 fL   MCH 28.4 26.0 - 34.0 pg   MCHC 32.7 30.0 - 36.0 g/dL   RDW 69.6 29.5 - 28.4 %   Platelets 196 150 - 400 K/uL   nRBC 0.0 0.0 - 0.2 %    Comment: Performed at [facility], [address], [place], [place] [address]  Troponin I (High Sensitivity)     Status: None   Collection Time: 07/15/22  6:00 PM  Result Value Ref Range   Troponin I (High Sensitivity) 6 <18 ng/L    Comment: (NOTE) Elevated high sensitivity troponin I (hsTnI) values and significant  changes across serial measurements may suggest ACS but many other  chronic and acute conditions are known to elevate hsTnI results.  Refer to the "Links" section for chest pain algorithms and additional  guidance. Performed at [facility], [address] [place], [place] [address]   CK     Status: Abnormal   Collection Time: 07/15/22  6:00 PM  Result Value Ref Range   Total CK 1,413 (H) 49 - 397 U/L    Comment: Performed at [facility], [address] [place], [place] [address]  Blood Alcohol level:  Lab Results  Component Value Date   ETH <10 07/15/2022   ETH <10 02/19/2018    Psychiatric Specialty Exam:  Presentation  General Appearance:  Disheveled  Eye Contact: Fair  Speech: Pressured  Speech Volume: Normal  Handedness: Right   Mood and Affect  Mood: Irritable  Affect: Congruent   Thought Process  Thought Processes: Disorganized  Descriptions of Associations:Tangential  Orientation:Full (Time, Place and Person)  Thought Content:Illogical; Paranoid Ideation  History of Schizophrenia/Schizoaffective disorder:No  Duration of Psychotic Symptoms:Less than six months  Hallucinations:Hallucinations: None  Ideas of Reference:Paranoia; Delusions  Suicidal Thoughts:Suicidal Thoughts: No  Homicidal  Thoughts:Homicidal Thoughts: No   Sensorium  Memory: Immediate Fair; Recent Fair  Judgment: Impaired  Insight: Poor   Art therapist  Concentration: Fair  Attention Span: Fair  Recall: Fair  Fund of Knowledge: Fair  Language: Fair   Psychomotor Activity  Psychomotor Activity: Psychomotor Activity: Restlessness   Assets  Assets: Desire for Improvement; Social Support; Resilience; Physical Health   Sleep  Sleep: Sleep: Poor Number of Hours of Sleep: 4    Physical Exam: Physical Exam Neurological:     Mental Status: He is alert and oriented to person, place, and time.  Psychiatric:        Attention and Perception: Attention normal.        Mood and Affect: Mood is anxious. Affect is angry.        Speech: Speech is rapid and pressured.        Behavior: Behavior is agitated.        Thought Content: Thought content is paranoid and delusional.    Review of Systems  Psychiatric/Behavioral:         Paranoid delusions, irritable, uncooperative with staff  All other systems reviewed and are negative.  Blood pressure 112/71, pulse 76, temperature 97.9 F (36.6 C), temperature source Oral, resp. rate 18, SpO2 97 %. There is no height or weight on file to calculate BMI.   Medical Decision Making: Pt case reviewed and discussed with Dr. Lucianne Muss. Pt continues to meet criteria for IVC and inpatient psychiatric treatment. There is no current availability at [facility], will request CSW fax out patient.   - Haldol increased to 10 mg daily  [name], NP 07/17/2022, 11:43 AM

## 2022-07-17 NOTE — ED Notes (Signed)
Attempted to give report to [facility] and per staff, "Call us back when transport is there and we will take report".

## 2022-07-17 NOTE — ED Notes (Signed)
Pt refusing haldol at this time stating, "People are talking about him and treating him badly and I'm not stupid". "I just want my methodone and to get the hell out of here".

## 2022-07-17 NOTE — ED Notes (Signed)
Pt got up to use the bathroom.

## 2022-08-08 ENCOUNTER — Other Ambulatory Visit: Payer: Self-pay

## 2022-08-08 ENCOUNTER — Other Ambulatory Visit (HOSPITAL_COMMUNITY): Payer: Self-pay

## 2022-08-08 MED ORDER — CLOTRIMAZOLE 1 % EX CREA: 1.0000 | TOPICAL_CREAM | Freq: Two times a day (BID) | CUTANEOUS | 0 refills | Status: DC

## 2022-08-08 MED ORDER — BUSPIRONE HCL 15 MG PO TABS
15.0000 mg | ORAL_TABLET | Freq: Three times a day (TID) | ORAL | 1 refills | Status: DC
  Filled 2022-08-08 – 2022-08-09 (×3): qty 90, 30d supply, fill #0
  Filled 2022-09-18: qty 90, 30d supply, fill #1

## 2022-08-08 MED ORDER — OLANZAPINE 15 MG PO TABS
15.0000 mg | ORAL_TABLET | Freq: Every evening | ORAL | 1 refills | Status: DC
  Filled 2022-08-08 – 2022-08-09 (×3): qty 30, 30d supply, fill #0
  Filled 2022-09-02: qty 30, 30d supply, fill #1

## 2022-08-08 MED ORDER — BUPROPION HCL ER (XL) 150 MG PO TB24
150.0000 mg | ORAL_TABLET | Freq: Every morning | ORAL | 1 refills | Status: DC
  Filled 2022-08-08 – 2022-08-09 (×3): qty 30, 30d supply, fill #0
  Filled 2022-09-02: qty 30, 30d supply, fill #1

## 2022-08-08 MED ORDER — GABAPENTIN 600 MG PO TABS
600.0000 mg | ORAL_TABLET | Freq: Three times a day (TID) | ORAL | 1 refills | Status: DC
  Filled 2022-08-08 (×2): qty 90, 30d supply, fill #0

## 2022-08-09 ENCOUNTER — Other Ambulatory Visit (HOSPITAL_COMMUNITY): Payer: Self-pay

## 2022-08-09 ENCOUNTER — Other Ambulatory Visit: Payer: Self-pay

## 2022-08-10 ENCOUNTER — Other Ambulatory Visit (HOSPITAL_COMMUNITY): Payer: Self-pay

## 2022-08-10 ENCOUNTER — Other Ambulatory Visit: Payer: Self-pay

## 2022-08-10 MED ORDER — HYDROXYZINE HCL 50 MG PO TABS
50.0000 mg | ORAL_TABLET | Freq: Every day | ORAL | 2 refills | Status: DC
  Filled 2022-08-10: qty 30, 30d supply, fill #0

## 2022-08-10 MED ORDER — GABAPENTIN 600 MG PO TABS
600.0000 mg | ORAL_TABLET | Freq: Three times a day (TID) | ORAL | 2 refills | Status: DC
  Filled 2022-08-10: qty 90, 30d supply, fill #0

## 2022-08-10 MED ORDER — BUPROPION HCL ER (XL) 150 MG PO TB24
150.0000 mg | ORAL_TABLET | Freq: Every morning | ORAL | 2 refills | Status: DC
  Filled 2022-08-10: qty 30, 30d supply, fill #0

## 2022-08-10 MED ORDER — OLANZAPINE 15 MG PO TABS
15.0000 mg | ORAL_TABLET | Freq: Every day | ORAL | 1 refills | Status: DC
  Filled 2022-08-10: qty 30, 30d supply, fill #0

## 2022-08-10 MED ORDER — BUSPIRONE HCL 15 MG PO TABS
15.0000 mg | ORAL_TABLET | Freq: Three times a day (TID) | ORAL | 2 refills | Status: DC
  Filled 2022-08-10: qty 90, 30d supply, fill #0

## 2022-08-10 MED ORDER — TRAZODONE HCL 100 MG PO TABS
100.0000 mg | ORAL_TABLET | Freq: Every evening | ORAL | 2 refills | Status: DC | PRN
  Filled 2022-08-10: qty 30, 30d supply, fill #0

## 2022-08-21 ENCOUNTER — Other Ambulatory Visit: Payer: Self-pay

## 2022-08-21 ENCOUNTER — Other Ambulatory Visit (HOSPITAL_COMMUNITY): Payer: Self-pay

## 2022-08-22 ENCOUNTER — Ambulatory Visit: Payer: MEDICAID | Admitting: Student

## 2022-08-22 ENCOUNTER — Other Ambulatory Visit: Payer: Self-pay

## 2022-08-22 ENCOUNTER — Other Ambulatory Visit (HOSPITAL_COMMUNITY): Payer: Self-pay

## 2022-08-22 ENCOUNTER — Encounter: Payer: Self-pay | Admitting: Student

## 2022-08-22 VITALS — BP 132/74 | HR 81 | Temp 98.4°F | Wt 255.9 lb

## 2022-08-22 DIAGNOSIS — M25562 Pain in left knee: Secondary | ICD-10-CM | POA: Diagnosis not present

## 2022-08-22 DIAGNOSIS — R600 Localized edema: Secondary | ICD-10-CM

## 2022-08-22 DIAGNOSIS — Z23 Encounter for immunization: Secondary | ICD-10-CM

## 2022-08-22 DIAGNOSIS — F15959 Other stimulant use, unspecified with stimulant-induced psychotic disorder, unspecified: Secondary | ICD-10-CM

## 2022-08-22 DIAGNOSIS — E782 Mixed hyperlipidemia: Secondary | ICD-10-CM

## 2022-08-22 DIAGNOSIS — F1994 Other psychoactive substance use, unspecified with psychoactive substance-induced mood disorder: Secondary | ICD-10-CM

## 2022-08-22 DIAGNOSIS — E785 Hyperlipidemia, unspecified: Secondary | ICD-10-CM | POA: Diagnosis not present

## 2022-08-22 DIAGNOSIS — Z0000 Encounter for general adult medical examination without abnormal findings: Secondary | ICD-10-CM

## 2022-08-22 DIAGNOSIS — F1924 Other psychoactive substance dependence with psychoactive substance-induced mood disorder: Secondary | ICD-10-CM | POA: Diagnosis not present

## 2022-08-22 DIAGNOSIS — G8929 Other chronic pain: Secondary | ICD-10-CM

## 2022-08-22 MED ORDER — NAPROXEN 500 MG PO TABS
500.0000 mg | ORAL_TABLET | ORAL | 2 refills | Status: DC | PRN
Start: 2022-08-22 — End: 2023-11-14
  Filled 2022-08-22: qty 60, 60d supply, fill #0

## 2022-08-22 NOTE — Assessment & Plan Note (Addendum)
Recent IVC for drug induced psychosis. Medical records unavailable. Endorses methamphetamine use which triggered latest episode. Complaint with medication and has not used since time of IVC. Follows with psychiatry. Denies AVH, paranoia, anxiety, depressed mood.   Plan: Continue medications per psych Follow with psychaitry

## 2022-08-22 NOTE — Progress Notes (Signed)
Subjective:  CC: hospital follow up, knee pain  HPI:  [name] is a 47 y.o. male with a past medical history stated below and presents today for hospital follow up. Please see problem based assessment and plan for additional details.  Past Medical History:  Diagnosis Date   ADHD    Hepatitis C    Hyperlipidemia    MDD (major depressive disorder)    Methadone use    clinic each day   Psychoactive substance-induced psychosis (HCC)    Substance induced mood disorder (HCC)     Current Outpatient Medications on File Prior to Visit  Medication Sig Dispense Refill   buPROPion (WELLBUTRIN XL) 150 MG 24 hr tablet Take 1 tablet (150 mg total) by mouth every morning. 30 tablet 1   busPIRone (BUSPAR) 15 MG tablet Take 1 tablet (15 mg total) by mouth 3 (three) times daily. 90 tablet 1   OLANZapine (ZYPREXA) 15 MG tablet Take 1 tablet (15 mg total) by mouth at bedtime. 30 tablet 1   rosuvastatin (CRESTOR) 10 MG tablet Take 1 tablet (10 mg total) by mouth daily. 30 tablet 11   TYLENOL 500 MG tablet Take 500-1,000 mg by mouth every 6 (six) hours as needed for mild pain or headache.     hydrOXYzine (ATARAX) 50 MG tablet Take 1 tablet (50 mg total) by mouth at bedtime. (Patient not taking: Reported on 08/22/2022) 30 tablet 2   [DISCONTINUED] ARIPiprazole (ABILIFY) 10 MG tablet Take 1 tablet (10 mg total) by mouth at bedtime. (Patient not taking: Reported on 07/15/2022) 30 tablet 2   [DISCONTINUED] atorvastatin (LIPITOR) 40 MG tablet Take 1 tablet (40 mg total) by mouth daily. 30 tablet 11   No current facility-administered medications on file prior to visit.    No family history on file.  Social History   Socioeconomic History   Marital status: Legally Separated    Spouse name: Not on file   Number of children: Not on file   Years of education: Not on file   Highest education level: Not on file  Occupational History   Not on file  Tobacco Use   Smoking status: Every Day     Packs/day: .5    Types: Cigarettes   Smokeless tobacco: Never   Tobacco comments:    1 pack every 3 days   Vaping Use   Vaping Use: Never used  Substance and Sexual Activity   Alcohol use: Yes   Drug use: Not Currently    Types: Marijuana, "Crack" cocaine, Methamphetamines, Heroin    Comment: last time he used 04/2014   Sexual activity: Not on file  Other Topics Concern   Not on file  Social History Narrative   Not on file   Social Determinants of Health   Financial Resource Strain: Not on file  Food Insecurity: Not on file  Transportation Needs: Not on file  Physical Activity: Not on file  Stress: Not on file  Social Connections: Not on file  Intimate Partner Violence: Not on file    Review of Systems: ROS negative except for what is noted on the assessment and plan.  Objective:   Vitals:   08/22/22 1127  BP: 132/74  Pulse: 81  Temp: 98.4 F (36.9 C)  TempSrc: Oral  SpO2: 99%  Weight: 255 lb 14.4 oz (116.1 kg)    Physical Exam: Constitutional: well-appearing in no acute distress HENT: normocephalic atraumatic, mucous membranes moist Eyes: conjunctiva non-erythematous Neck: supple Cardiovascular: regular rate  and rhythm, no m/r/g Pulmonary/Chest: normal work of breathing on room air, lungs clear to auscultation bilaterally Abdominal: soft, non-tender, non-distended MSK: normal bulk and tone. Edema of the Hands and feet. Tenderness to palpation of the L Knee.  Neurological: alert & oriented x 3, 5/5 strength in bilateral upper and lower extremities, normal gait Skin: warm and dry Psych: Normal mood and affect     Assessment & Plan:  Methamphetamine-induced psychotic disorder (HCC)  Recent IVC for drug induced psychosis. Medical records unavailable. Endorses methamphetamine use which triggered latest episode. Complaint with medication and has not used since time of IVC. Follows with psychiatry. Denies AVH, paranoia, anxiety, depressed mood.    Plan: Continue medications per psych Follow with psychaitry  Psychoactive substance-induced mood disorder (HCC) >>ASSESSMENT AND PLAN FOR SUBSTANCE INDUCED MOOD DISORDER (HCC) WRITTEN ON 08/22/2022 11:12 AM BY Lovie Macadamia, MD  Recent IVC for drug induced psychosis. Medical records unavailable. Endorses methamphetamine use which triggered latest episode. Complaint with medication and has not used since time of IVC. Follows with psychiatry. Denies AVH, paranoia, anxiety, depressed mood.   Plan: Continue medications per psych Follow with psychaitry  Substance induced mood disorder (HCC) Recent IVC for drug induced psychosis. Medical records unavailable. Endorses methamphetamine use which triggered latest episode. Complaint with medication and has not used since time of IVC. Follows with psychiatry. Denies AVH, paranoia, anxiety, depressed mood.   Plan: Continue medications per psych Follow with psychaitry  Hyperlipidemia Last lipid panel 2 years ago, LDL elevated. On Rosuvastatin 10mg  Plan: -Recheck Lipid Panel  Extremity edema Bilateral hand and foot edema.  The edema causes pain and discomfort for the patient.  He says that the edema began when he started taking gabapentin.  He was on 600 3 times daily, low dose reduced to 200 3 times daily for currently. Plan: -Discontinue gabapentin.  Knee pain, left Chronic pain of the left knee, ongoing for many years.  History of trauma to the left lower extremity as well.  Currently on gabapentin 200 mg, however the patient is having significant amount of swelling of the hands and the feet.  We feel that this is likely a side effect of gabapentin. Plan: -Discontinue gabapentin -Physical therapy referral -Conservative management using Tylenol and NSAIDs.  Discussed with the patient that though they have history of hep C, medical history otherwise allows him to use Tylenol and NSAIDs.   Patient seen with Dr. Cleda Daub  Lovie Macadamia MD [facility]  PGY-1 Pager: [phone]  Phone: [phone] Date 08/22/2022  Time 1:10 PM

## 2022-08-22 NOTE — Assessment & Plan Note (Addendum)
Bilateral hand and foot edema.  The edema causes pain and discomfort for the patient.  He says that the edema began when he started taking gabapentin.  He was on 600 3 times daily, low dose reduced to 200 3 times daily for currently. Plan: -Discontinue gabapentin.

## 2022-08-22 NOTE — Assessment & Plan Note (Addendum)
Chronic pain of the left knee, ongoing for many years.  History of trauma to the left lower extremity as well.  Currently on gabapentin 200 mg, however the patient is having significant amount of swelling of the hands and the feet.  We feel that this is likely a side effect of gabapentin. Plan: -Discontinue gabapentin -Physical therapy referral -Conservative management using Tylenol and NSAIDs.  Discussed with the patient that though they have history of hep C, medical history otherwise allows him to use Tylenol and NSAIDs.

## 2022-08-22 NOTE — Assessment & Plan Note (Signed)
>>  ASSESSMENT AND PLAN FOR SUBSTANCE INDUCED MOOD DISORDER (HCC) WRITTEN ON 08/22/2022 11:12 AM BY Lovie Macadamia, MD  Recent IVC for drug induced psychosis. Medical records unavailable. Endorses methamphetamine use which triggered latest episode. Complaint with medication and has not used since time of IVC. Follows with psychiatry. Denies AVH, paranoia, anxiety, depressed mood.   Plan: Continue medications per psych Follow with psychaitry

## 2022-08-22 NOTE — Assessment & Plan Note (Addendum)
  Recent IVC for drug induced psychosis. Medical records unavailable. Endorses methamphetamine use which triggered latest episode. Complaint with medication and has not used since time of IVC. Follows with psychiatry. Denies AVH, paranoia, anxiety, depressed mood.   Plan: Continue medications per psych Follow with psychaitry

## 2022-08-22 NOTE — Assessment & Plan Note (Signed)
Last lipid panel 2 years ago, LDL elevated. On Rosuvastatin 10mg  Plan: -Recheck Lipid Panel

## 2022-08-22 NOTE — Patient Instructions (Addendum)
Thank you, Victor Gomez for allowing [place] to provide your care today. Today we discussed Knee pain, hospital follow up.    I have ordered the following labs for you:  Lab Orders         Lipid Profile      Referrals ordered today:   Referral Orders         Ambulatory referral to Physical Therapy      I have ordered the following medication/changed the following medications:   Stop the following medications: Medications Discontinued During This Encounter  Medication Reason   busPIRone (BUSPAR) 15 MG tablet    diphenhydrAMINE (BENADRYL ALLERGY) 25 MG tablet    gabapentin (NEURONTIN) 600 MG tablet    gabapentin (NEURONTIN) 600 MG tablet    methadone (DOLOPHINE) 10 MG/ML solution    traZODone (DESYREL) 50 MG tablet    traZODone (DESYREL) 100 MG tablet    TUMS 500 MG chewable tablet    docusate sodium (COLACE) 100 MG capsule    buPROPion (WELLBUTRIN XL) 150 MG 24 hr tablet    atomoxetine (STRATTERA) 25 MG capsule    clotrimazole (RA ATHLETES FOOT) 1 % cream    Melatonin 10 MG TABS    OLANZapine (ZYPREXA) 15 MG tablet    gabapentin (NEURONTIN) 100 MG capsule      Start the following medications: Meds ordered this encounter  Medications   naproxen (NAPROSYN) 500 MG tablet    Sig: Take 1 tablet (500 mg total) by mouth as needed.    Dispense:  60 tablet    Refill:  2     Follow up: 2-3 months   Remember:  Discontinue Gabapentin Use Tylenol and Naproxen as needed for pain   We look forward to seeing you next time. Please call our clinic at [phone] if you have any questions or concerns. The best time to call is Monday-Friday from 9am-4pm, but there is someone available 24/7. If after hours or the weekend, call the main hospital number and ask for the Internal Medicine Resident On-Call. If you need medication refills, please notify your pharmacy one week in advance and they will send [place] a request.   Thank you for trusting me with your care. Wishing you the best!  Lovie Macadamia MD [facility]

## 2022-08-23 ENCOUNTER — Other Ambulatory Visit: Payer: Self-pay | Admitting: Student

## 2022-08-23 ENCOUNTER — Telehealth: Payer: Self-pay | Admitting: Student

## 2022-08-23 ENCOUNTER — Other Ambulatory Visit: Payer: Self-pay

## 2022-08-23 DIAGNOSIS — E785 Hyperlipidemia, unspecified: Secondary | ICD-10-CM

## 2022-08-23 DIAGNOSIS — E782 Mixed hyperlipidemia: Secondary | ICD-10-CM

## 2022-08-23 LAB — LIPID PANEL
Chol/HDL Ratio: 4.2 ratio (ref 0.0–5.0)
Cholesterol, Total: 249 mg/dL — ABNORMAL HIGH (ref 100–199)
HDL: 59 mg/dL (ref >39)
LDL Chol Calc (NIH): 110 mg/dL — ABNORMAL HIGH (ref 0–99)
Triglycerides: 469 mg/dL — ABNORMAL HIGH (ref 0–149)
VLDL Cholesterol Cal: 80 mg/dL — ABNORMAL HIGH (ref 5–40)

## 2022-08-23 MED ORDER — ROSUVASTATIN CALCIUM 20 MG PO TABS
20.0000 mg | ORAL_TABLET | Freq: Every day | ORAL | 11 refills | Status: DC
Start: 2022-08-23 — End: 2023-09-06
  Filled 2022-08-23: qty 30, 30d supply, fill #0
  Filled 2022-09-28: qty 30, 30d supply, fill #1
  Filled 2022-10-27: qty 30, 30d supply, fill #2
  Filled 2022-12-04 (×2): qty 30, 30d supply, fill #3
  Filled 2023-01-01: qty 30, 30d supply, fill #4
  Filled 2023-02-05: qty 30, 30d supply, fill #5
  Filled 2023-03-03: qty 30, 30d supply, fill #6
  Filled 2023-04-02: qty 30, 30d supply, fill #7
  Filled 2023-05-07: qty 30, 30d supply, fill #8
  Filled 2023-06-05: qty 30, 30d supply, fill #9
  Filled 2023-07-12 (×2): qty 30, 30d supply, fill #10
  Filled 2023-08-04: qty 30, 30d supply, fill #11

## 2022-08-23 NOTE — Addendum Note (Signed)
Addended by: Lucille Passy on: 08/23/2022 01:06 PM   Modules accepted: Orders

## 2022-08-23 NOTE — Telephone Encounter (Signed)
Spoke with the patient regarding their lab results and increasing their statin.

## 2022-08-29 NOTE — Progress Notes (Signed)
 Internal Medicine Clinic Attending  I saw and evaluated the patient.  I personally confirmed the key portions of the history and exam documented by the resident  and I reviewed pertinent patient test results.  The assessment, diagnosis, and plan were formulated together and I agree with the documentation in the resident's note.  

## 2022-09-02 ENCOUNTER — Other Ambulatory Visit: Payer: Self-pay | Admitting: Student

## 2022-09-02 ENCOUNTER — Other Ambulatory Visit (HOSPITAL_COMMUNITY): Payer: Self-pay | Admitting: Psychiatry

## 2022-09-02 ENCOUNTER — Other Ambulatory Visit (HOSPITAL_COMMUNITY): Payer: Self-pay

## 2022-09-04 ENCOUNTER — Other Ambulatory Visit (HOSPITAL_COMMUNITY): Payer: Self-pay

## 2022-09-04 ENCOUNTER — Encounter (HOSPITAL_COMMUNITY): Payer: Self-pay

## 2022-09-04 ENCOUNTER — Other Ambulatory Visit: Payer: Self-pay | Admitting: Student

## 2022-09-05 ENCOUNTER — Other Ambulatory Visit (HOSPITAL_COMMUNITY): Payer: Self-pay

## 2022-09-05 MED ORDER — GABAPENTIN 100 MG PO CAPS
100.0000 mg | ORAL_CAPSULE | Freq: Three times a day (TID) | ORAL | 0 refills | Status: DC
  Filled 2022-09-05: qty 90, 30d supply, fill #0

## 2022-09-08 ENCOUNTER — Other Ambulatory Visit (HOSPITAL_COMMUNITY): Payer: Self-pay

## 2022-09-12 ENCOUNTER — Other Ambulatory Visit (HOSPITAL_COMMUNITY): Payer: Self-pay

## 2022-09-13 ENCOUNTER — Other Ambulatory Visit (HOSPITAL_COMMUNITY): Payer: Self-pay

## 2022-09-16 ENCOUNTER — Other Ambulatory Visit (HOSPITAL_COMMUNITY): Payer: Self-pay

## 2022-09-18 ENCOUNTER — Other Ambulatory Visit (HOSPITAL_COMMUNITY): Payer: Self-pay

## 2022-09-20 ENCOUNTER — Other Ambulatory Visit (HOSPITAL_COMMUNITY): Payer: Self-pay

## 2022-09-22 ENCOUNTER — Other Ambulatory Visit (HOSPITAL_COMMUNITY): Payer: Self-pay

## 2022-09-23 ENCOUNTER — Other Ambulatory Visit (HOSPITAL_COMMUNITY): Payer: Self-pay

## 2022-09-25 ENCOUNTER — Other Ambulatory Visit (HOSPITAL_COMMUNITY): Payer: Self-pay

## 2022-09-27 ENCOUNTER — Other Ambulatory Visit (HOSPITAL_COMMUNITY): Payer: Self-pay

## 2022-09-27 MED ORDER — GABAPENTIN 300 MG PO CAPS
300.0000 mg | ORAL_CAPSULE | Freq: Three times a day (TID) | ORAL | 0 refills | Status: DC
  Filled 2022-09-27 (×2): qty 90, 30d supply, fill #0

## 2022-09-28 ENCOUNTER — Other Ambulatory Visit (HOSPITAL_COMMUNITY): Payer: Self-pay

## 2022-09-28 ENCOUNTER — Other Ambulatory Visit: Payer: Self-pay

## 2022-09-28 ENCOUNTER — Encounter: Payer: Self-pay | Admitting: Student

## 2022-09-28 ENCOUNTER — Ambulatory Visit: Payer: MEDICAID | Admitting: Student

## 2022-09-28 DIAGNOSIS — T402X5A Adverse effect of other opioids, initial encounter: Secondary | ICD-10-CM | POA: Diagnosis not present

## 2022-09-28 DIAGNOSIS — Z131 Encounter for screening for diabetes mellitus: Secondary | ICD-10-CM

## 2022-09-28 DIAGNOSIS — K5903 Drug induced constipation: Secondary | ICD-10-CM

## 2022-09-28 DIAGNOSIS — R3989 Other symptoms and signs involving the genitourinary system: Secondary | ICD-10-CM | POA: Insufficient documentation

## 2022-09-28 MED ORDER — LINACLOTIDE 145 MCG PO CAPS
145.0000 ug | ORAL_CAPSULE | Freq: Every day | ORAL | 1 refills | Status: DC
Start: 2022-09-28 — End: 2022-12-07
  Filled 2022-09-28 (×2): qty 30, 30d supply, fill #0
  Filled 2022-10-27: qty 30, 30d supply, fill #1

## 2022-09-28 NOTE — Assessment & Plan Note (Addendum)
Patient requested Telehealth visit for worsening constipation. Since being on Methadone for the past 5 years, he has been taking about 6x 100 mg pills of docusate which makes it easier to have BM  with a frequency of about every other day. Recently he was admitted to the hospital for inpatient rehab for ongoing mental health challenges. He was discharged on Zyprexa 15 mg nightly and has been adherent to therapy. He was previously followed at [facility] and is now being transferred to be cared for at [facility], who will be managing psych medications. He has been stable since his discharge and is responding to questions appropriately during this encounter.  He denies nausea, vomiting, abdominal pain, melena or hematemesis. He does not have a history of GI cancers in his family. No weight loss but weight gain. No new history of abdominal bloating. He reports that with the addition of Miralax he gets abdominal cramping.   Given his history of methadone treatment and recent addition of Zyprexa, he is therapeutic opioid induced constipation has worsened.  Discussed treatment with Linzess 145 mcg capsules daily.  Will reevaluate at follow-up in 4 weeks if up titration is needed.  If diarrhea with this current regimen, patient can decrease amount of docusate treatment he is using to better evaluate treatment effectiveness of Linzess.

## 2022-09-28 NOTE — Progress Notes (Signed)
  [facility] Internal Medicine Residency Telephone Encounter Continuity Care Appointment  HPI:  This telephone encounter was created for [name] on 09/28/2022 for the following purpose/cc worsening constipation.   Past Medical History:  Past Medical History:  Diagnosis Date   ADHD    Hepatitis C    Hyperlipidemia    MDD (major depressive disorder)    Methadone use    clinic each day   Psychoactive substance-induced psychosis (HCC)    Substance induced mood disorder (HCC)      ROS:     Assessment / Plan / Recommendations:  Therapeutic opioid-induced constipation (OIC) Patient requested Telehealth visit for worsening constipation. Since being on Methadone for the past 5 years, he has been taking about 6x 100 mg pills of docusate which makes it easier to have BM  with a frequency of about every other day. Recently he was admitted to the hospital for inpatient rehab for ongoing mental health challenges. He was discharged on Zyprexa 15 mg nightly and has been adherent to therapy. He was previously followed at [facility] and is now being transferred to be cared for at [facility], who will be managing psych medications. He has been stable since his discharge and is responding to questions appropriately during this encounter.  He denies nausea, vomiting, abdominal pain, melena or hematemesis. He does not have a history of GI cancers in his family. No weight loss but weight gain. No new history of abdominal bloating. He reports that with the addition of Miralax he gets abdominal cramping.   Given his history of methadone treatment and recent addition of Zyprexa, he is therapeutic opioid induced constipation has worsened.  Discussed treatment with Linzess 145 mcg capsules daily.  Will reevaluate at follow-up in 4 weeks if up titration is needed.  If diarrhea with this current regimen, patient can decrease amount of docusate treatment he is using to better evaluate treatment effectiveness  of Linzess.  Screening for diabetes mellitus (DM) Patient reported increased appetite, hyperphagia, weight gain, polydipsia and polyuria.  Inquired about treatment with metformin.  Does not have a reason A1c on file.  Discussed that this will need an inpatient visit with blood work to screen for diabetes and other conditions that would lead to this symptoms.    Schedule follow-up appointment during this visit; at follow-up, please add A1c, renal function and UA to further evaluate  Return in about 4 weeks (around 10/26/2022) for Opioid-induced constipation and diabetes screening.    As always, pt is advised that if symptoms worsen or new symptoms arise, they should go to an urgent care facility or to to ER for further evaluation.   Consent and Medical Decision Making:  Patient discussed with Dr.  Lafonda Mosses This is a telephone encounter between [name] and Morene Crocker ,MD on 09/28/2022 for constipation. The visit was conducted with the patient located at home and Morene Crocker ,MD at [facility]. The patient's identity was confirmed using their DOB and current address. The patient has consented to being evaluated through a telephone encounter and understands the associated risks (an examination cannot be done and the patient may need to come in for an appointment) / benefits (allows the patient to remain at home, decreasing exposure to coronavirus). I personally spent 30 minutes on medical discussion.

## 2022-09-28 NOTE — Assessment & Plan Note (Signed)
Patient reported increased appetite, hyperphagia, weight gain, polydipsia and polyuria.  Inquired about treatment with metformin.  Does not have a reason A1c on file.  Discussed that this will need an inpatient visit with blood work to screen for diabetes and other conditions that would lead to this symptoms.    Schedule follow-up appointment during this visit; at follow-up, please add A1c, renal function and UA to further evaluate

## 2022-10-03 NOTE — Progress Notes (Signed)
Internal Medicine Clinic Attending  Case discussed with the resident at the time of the visit.  We reviewed the resident's history and exam and pertinent patient test results.  I agree with the assessment, diagnosis, and plan of care documented in the resident's note.  

## 2022-10-07 ENCOUNTER — Other Ambulatory Visit (HOSPITAL_COMMUNITY): Payer: Self-pay

## 2022-10-10 ENCOUNTER — Other Ambulatory Visit (HOSPITAL_COMMUNITY): Payer: Self-pay

## 2022-10-11 ENCOUNTER — Other Ambulatory Visit (HOSPITAL_COMMUNITY): Payer: Self-pay

## 2022-10-11 ENCOUNTER — Other Ambulatory Visit: Payer: Self-pay

## 2022-10-11 MED ORDER — GABAPENTIN 300 MG PO CAPS
300.0000 mg | ORAL_CAPSULE | Freq: Three times a day (TID) | ORAL | 1 refills | Status: AC
  Filled 2022-10-11 – 2022-10-27 (×2): qty 90, 30d supply, fill #0

## 2022-10-11 MED ORDER — TRAZODONE HCL 50 MG PO TABS
50.0000 mg | ORAL_TABLET | Freq: Every evening | ORAL | 1 refills | Status: DC
  Filled 2022-10-11: qty 30, 30d supply, fill #0

## 2022-10-11 MED ORDER — OLANZAPINE 15 MG PO TABS
15.0000 mg | ORAL_TABLET | Freq: Two times a day (BID) | ORAL | 1 refills | Status: AC
  Filled 2022-10-11: qty 60, 30d supply, fill #0
  Filled 2022-12-04 (×2): qty 60, 30d supply, fill #1

## 2022-10-11 MED ORDER — BUPROPION HCL ER (XL) 150 MG PO TB24
150.0000 mg | ORAL_TABLET | Freq: Two times a day (BID) | ORAL | 0 refills | Status: DC
  Filled 2022-10-11: qty 60, 30d supply, fill #0

## 2022-10-12 ENCOUNTER — Other Ambulatory Visit (HOSPITAL_COMMUNITY): Payer: Self-pay

## 2022-10-13 ENCOUNTER — Other Ambulatory Visit: Payer: Self-pay

## 2022-10-13 ENCOUNTER — Ambulatory Visit (INDEPENDENT_AMBULATORY_CARE_PROVIDER_SITE_OTHER): Payer: MEDICAID | Admitting: Internal Medicine

## 2022-10-13 VITALS — BP 126/76 | HR 79 | Temp 98.5°F | Resp 20 | Ht 72.0 in | Wt 275.9 lb

## 2022-10-13 DIAGNOSIS — K5903 Drug induced constipation: Secondary | ICD-10-CM | POA: Diagnosis not present

## 2022-10-13 DIAGNOSIS — Z131 Encounter for screening for diabetes mellitus: Secondary | ICD-10-CM

## 2022-10-13 DIAGNOSIS — R3989 Other symptoms and signs involving the genitourinary system: Secondary | ICD-10-CM | POA: Diagnosis not present

## 2022-10-13 DIAGNOSIS — T402X5A Adverse effect of other opioids, initial encounter: Secondary | ICD-10-CM

## 2022-10-13 DIAGNOSIS — R399 Unspecified symptoms and signs involving the genitourinary system: Secondary | ICD-10-CM

## 2022-10-13 DIAGNOSIS — Z0000 Encounter for general adult medical examination without abnormal findings: Secondary | ICD-10-CM

## 2022-10-13 LAB — GLUCOSE, CAPILLARY: Glucose-Capillary: 109 mg/dL — ABNORMAL HIGH (ref 70–99)

## 2022-10-13 LAB — POCT GLYCOSYLATED HEMOGLOBIN (HGB A1C): Hemoglobin A1C: 5.4 % (ref 4.0–5.6)

## 2022-10-13 NOTE — Progress Notes (Addendum)
CC: telehealth visit follow up  HPI:  [name] is a 47 y.o. with medical history of opiate use disorder, HLD, MDD, presenting to [facility] for follow up for polydipsia and polyuria.   Please see problem-based list for further details, assessments, and plans.  Past Medical History:  Diagnosis Date   ADHD    Elevated transaminase level 03/13/2013   Hepatitis C    Hyperlipidemia    MDD (major depressive disorder)    Methadone use    clinic each day   Psychoactive substance-induced psychosis (HCC)    Substance induced mood disorder (HCC)    Tibia/fibula fracture, shaft, unspecified laterality, sequela 05/31/2017     Current Outpatient Medications (Cardiovascular):    rosuvastatin (CRESTOR) 20 MG tablet, Take 1 tablet (20 mg total) by mouth daily.   Current Outpatient Medications (Analgesics):    naproxen (NAPROSYN) 500 MG tablet, Take 1 tablet (500 mg total) by mouth as needed.   TYLENOL 500 MG tablet, Take 500-1,000 mg by mouth every 6 (six) hours as needed for mild pain or headache.   Current Outpatient Medications (Other):    buPROPion (WELLBUTRIN XL) 150 MG 24 hr tablet, Take 1 tablet (150 mg total) by mouth every morning.   buPROPion (WELLBUTRIN XL) 150 MG 24 hr tablet, Take 1 tablet (150 mg total) by mouth 2 (two) times daily.   busPIRone (BUSPAR) 15 MG tablet, Take 1 tablet (15 mg total) by mouth 3 (three) times daily.   gabapentin (NEURONTIN) 100 MG capsule, Take 1 capsule (100 mg) by mouth 3 times daily.   gabapentin (NEURONTIN) 300 MG capsule, Take 1 capsule (300 mg total) by mouth 3 (three) times daily.   linaclotide (LINZESS) 145 MCG CAPS capsule, Take 1 capsule (145 mcg total) by mouth daily before breakfast.   OLANZapine (ZYPREXA) 15 MG tablet, Take 1 tablet (15 mg total) by mouth at bedtime.   OLANZapine (ZYPREXA) 15 MG tablet, Take 1 tablet (15 mg total) by mouth 2 (two) times daily.   traZODone (DESYREL) 50 MG tablet, Take 1 tablet (50 mg total) by mouth  every evening.  Review of Systems:  Review of system negative unless stated in the problem list or HPI.    Physical Exam:  Vitals:   10/13/22 1000  BP: 126/76  Pulse: 79  Resp: 20  Temp: 98.5 F (36.9 C)  TempSrc: Oral  SpO2: 93%  Weight: 275 lb 14.4 oz (125.1 kg)  Height: 6' (1.829 m)   Physical Exam General: NAD, somnolent on exam  HENT: NCAT Lungs: CTAB, no wheeze, rhonchi or rales.  Cardiovascular: Normal heart sounds, no r/m/g, 2+ pulses in all extremities. No LE edema Abdomen: No TTP, normal bowel sounds MSK: No asymmetry or muscle atrophy, no flank pain Skin: no lesions noted on exposed skin Neuro: Alert and oriented x4. CN grossly intact Psych: Normal mood and normal affect   Assessment & Plan:   Urinary problem Patient with chronic complaint of polyuria, polydipsia, trouble with initiation of urination and maintaining consistent stream.  He describes a weak urinary stream but states his problems are more pronounced at night and  less during the daytime.  DDx included diabetes versus BPH versus nephrolithiasis versus social reasons.  There was initial concern for diabetes so he was instructed to come in person for lab work.  A1c was checked at which was normal.  There was concern for BPH although patient's age is on the lower side for BPH and his symptoms have atypical in  nature or worsening at nighttime.  Did explore CHF symptoms with the patient as that can lead to nocturia secondary to increased preload while supine the patient denies any dyspnea on exertion, orthopnea or other symptoms consistent with CHF.  Another etiology that explain patient's symptoms is nephrolithiasis.  Patient denies any pain with urination but urinalysis suggestive of a urinary calculi with calcium oxalate crystals and hematuria.  This is most likely nonobstructing.  This may be incidental finding but if patient has development of pain will work this up further.  If patient is for further  symptoms, will recommend noncontrast CT.  Advised patient to stay hydrated and call with any symptoms of pain.  Patient does have polysubstance use and appeared to be intoxicated during interview.  He lives with his mother and she is the one who states he is taking a prolonged time in the bathroom.  There could be a social aspect to this and patient was questioned about the degree of concern he has of the symptoms, patient stated his concern at the moment is low.  The mother denies any history of prostate cancer in the family but states some members of the family did have BPH.  Postvoid residual bladder volume was checked and it was 30 cc making urinary retention and likely etiology.  Will plan to repeat UA on subsequent visit to ensure hematuria has resolved.  Will have patient follow-up in 2 weeks with voiding diary and diary of fluid intake.  Healthcare maintenance A1c performed this visit.  Discussed colonoscopy with the patient he states he would like to wait.  Therapeutic opioid-induced constipation (OIC) Patient states his constipation is resolved with Linzess.  Will continue this medication.   See Encounters Tab for problem based charting.  Patient Discussed with Dr. Tanja Port, MD [name]. [facility] Internal Medicine Residency, PGY-3

## 2022-10-13 NOTE — Patient Instructions (Addendum)
Victor Gomez, it was a pleasure seeing you today! You endorsed feeling well today. Below are some of the things we talked about this visit. We look forward to seeing you in the follow up appointment!  Today we discussed: We will perform some testing. Please keep a diary of what you are drinking and how many times you are peeing in a day.  Please continue your current medications.   I have ordered the following labs today:  Lab Orders         Urinalysis, Complete (81001)         BMP8+Anion Gap         POC Hbg A1C       Referrals ordered today:   Referral Orders  No referral(s) requested today     I have ordered the following medication/changed the following medications:   Stop the following medications: Medications Discontinued During This Encounter  Medication Reason   hydrOXYzine (ATARAX) 50 MG tablet Patient has not taken in last 30 days     Start the following medications: No orders of the defined types were placed in this encounter.    Follow-up: 2 week follow up   Please make sure to arrive 15 minutes prior to your next appointment. If you arrive late, you may be asked to reschedule.   We look forward to seeing you next time. Please call our clinic at [phone] if you have any questions or concerns. The best time to call is Monday-Friday from 9am-4pm, but there is someone available 24/7. If after hours or the weekend, call the main hospital number and ask for the Internal Medicine Resident On-Call. If you need medication refills, please notify your pharmacy one week in advance and they will send [place] a request.  Thank you for letting us take part in your care. Wishing you the best!  Thank you, Gwenevere Abbot, MD

## 2022-10-14 LAB — BMP8+ANION GAP
Anion Gap: 15 mmol/L (ref 10.0–18.0)
BUN/Creatinine Ratio: 26 — ABNORMAL HIGH (ref 9–20)
BUN: 19 mg/dL (ref 6–24)
CO2: 19 mmol/L — ABNORMAL LOW (ref 20–29)
Calcium: 9.6 mg/dL (ref 8.7–10.2)
Chloride: 106 mmol/L (ref 96–106)
Creatinine, Ser: 0.72 mg/dL — ABNORMAL LOW (ref 0.76–1.27)
Glucose: 94 mg/dL (ref 70–99)
Potassium: 4.6 mmol/L (ref 3.5–5.2)
Sodium: 140 mmol/L (ref 134–144)
eGFR: 115 mL/min/{1.73_m2} (ref >59)

## 2022-10-14 LAB — MICROSCOPIC EXAMINATION
Bacteria, UA: NONE SEEN
Casts: NONE SEEN /LPF
Epithelial Cells (non renal): NONE SEEN /HPF (ref 0–10)

## 2022-10-14 LAB — URINALYSIS, COMPLETE
Bilirubin, UA: NEGATIVE
Glucose, UA: NEGATIVE
Ketones, UA: NEGATIVE
Leukocytes,UA: NEGATIVE
Nitrite, UA: NEGATIVE
Protein,UA: NEGATIVE
RBC, UA: NEGATIVE
Specific Gravity, UA: 1.018 (ref 1.005–1.030)
Urobilinogen, Ur: 0.2 mg/dL (ref 0.2–1.0)
pH, UA: 5.5 (ref 5.0–7.5)

## 2022-10-15 ENCOUNTER — Encounter: Payer: Self-pay | Admitting: Internal Medicine

## 2022-10-15 NOTE — Assessment & Plan Note (Signed)
Patient states his constipation is resolved with Linzess.  Will continue this medication.

## 2022-10-15 NOTE — Assessment & Plan Note (Signed)
A1c performed this visit.  Discussed colonoscopy with the patient he states he would like to wait.

## 2022-10-15 NOTE — Assessment & Plan Note (Addendum)
Patient with chronic complaint of polyuria, polydipsia, trouble with initiation of urination and maintaining consistent stream.  He describes a weak urinary stream but states his problems are more pronounced at night and  less during the daytime.  DDx included diabetes versus BPH versus nephrolithiasis versus social reasons.  There was initial concern for diabetes so he was instructed to come in person for lab work.  A1c was checked at which was normal.  There was concern for BPH although patient's age is on the lower side for BPH and his symptoms have atypical in nature or worsening at nighttime.  Did explore CHF symptoms with the patient as that can lead to nocturia secondary to increased preload while supine the patient denies any dyspnea on exertion, orthopnea or other symptoms consistent with CHF.  Another etiology that explain patient's symptoms is nephrolithiasis.  Patient denies any pain with urination but urinalysis suggestive of a urinary calculi with calcium oxalate crystals and hematuria.  This is most likely nonobstructing.  This may be incidental finding but if patient has development of pain will work this up further.  If patient is for further symptoms, will recommend noncontrast CT.  Advised patient to stay hydrated and call with any symptoms of pain.  Patient does have polysubstance use and appeared to be intoxicated during interview.  He lives with his mother and she is the one who states he is taking a prolonged time in the bathroom.  There could be a social aspect to this and patient was questioned about the degree of concern he has of the symptoms, patient stated his concern at the moment is low.  The mother denies any history of prostate cancer in the family but states some members of the family did have BPH.  Postvoid residual bladder volume was checked and it was 30 cc making urinary retention and likely etiology.  Will plan to repeat UA on subsequent visit to ensure hematuria has resolved.   Will have patient follow-up in 2 weeks with voiding diary and diary of fluid intake.

## 2022-10-25 NOTE — Addendum Note (Signed)
Addended by: Gwenevere Abbot on: 10/25/2022 02:00 PM   Modules accepted: Level of Service

## 2022-10-26 NOTE — Progress Notes (Signed)
Internal Medicine Clinic Attending  I was physically present during the key portions of the resident provided service and participated in the medical decision making of patient's management care. I reviewed pertinent patient test results.  The assessment, diagnosis, and plan were formulated together and I agree with the documentation in the resident's note.  , , MD  

## 2022-10-27 ENCOUNTER — Other Ambulatory Visit: Payer: Self-pay | Admitting: Student

## 2022-10-27 ENCOUNTER — Other Ambulatory Visit (HOSPITAL_COMMUNITY): Payer: Self-pay

## 2022-10-27 ENCOUNTER — Other Ambulatory Visit: Payer: Self-pay

## 2022-10-30 ENCOUNTER — Other Ambulatory Visit (HOSPITAL_COMMUNITY): Payer: Self-pay

## 2022-10-30 ENCOUNTER — Other Ambulatory Visit (HOSPITAL_BASED_OUTPATIENT_CLINIC_OR_DEPARTMENT_OTHER): Payer: Self-pay

## 2022-10-31 ENCOUNTER — Other Ambulatory Visit (HOSPITAL_COMMUNITY): Payer: Self-pay

## 2022-11-03 ENCOUNTER — Other Ambulatory Visit: Payer: Self-pay | Admitting: *Deleted

## 2022-11-03 ENCOUNTER — Other Ambulatory Visit: Payer: Self-pay

## 2022-11-03 ENCOUNTER — Other Ambulatory Visit (HOSPITAL_COMMUNITY): Payer: Self-pay

## 2022-11-03 MED ORDER — BUSPIRONE HCL 15 MG PO TABS
15.0000 mg | ORAL_TABLET | Freq: Three times a day (TID) | ORAL | 1 refills | Status: AC
  Filled 2022-11-03: qty 90, 30d supply, fill #0
  Filled 2023-06-22 – 2023-06-27 (×2): qty 90, 30d supply, fill #1

## 2022-11-03 NOTE — Telephone Encounter (Signed)
Call from patient's mother-patient is out of his Buspar that was prescribed from his provider at [facility]. They have released patient and will no longer provide care.   Patient's mothers said that patient has been out of the generic Buspar for 2-3 days.  Would like to see if doctor here can order.  Is currently looking for another counselor for patient who will be able to prescribe for the patient.  Uses the [employer].  Would like to get today.

## 2022-11-04 ENCOUNTER — Other Ambulatory Visit (HOSPITAL_COMMUNITY): Payer: Self-pay

## 2022-11-13 ENCOUNTER — Other Ambulatory Visit (HOSPITAL_COMMUNITY): Payer: Self-pay

## 2022-11-13 ENCOUNTER — Other Ambulatory Visit: Payer: Self-pay

## 2022-11-13 MED ORDER — BUSPIRONE HCL 15 MG PO TABS
15.0000 mg | ORAL_TABLET | Freq: Three times a day (TID) | ORAL | 0 refills | Status: DC
  Filled 2022-11-13: qty 90, 30d supply, fill #0

## 2022-11-13 MED ORDER — BUPROPION HCL ER (XL) 150 MG PO TB24
150.0000 mg | ORAL_TABLET | Freq: Every morning | ORAL | 0 refills | Status: DC
  Filled 2022-11-13: qty 30, 30d supply, fill #0

## 2022-11-13 MED ORDER — OLANZAPINE 15 MG PO TABS
15.0000 mg | ORAL_TABLET | Freq: Every day | ORAL | 0 refills | Status: DC
Start: 2022-11-13 — End: 2023-03-08
  Filled 2022-11-13: qty 30, 30d supply, fill #0

## 2022-11-13 MED ORDER — HYDROXYZINE HCL 50 MG PO TABS
50.0000 mg | ORAL_TABLET | Freq: Every day | ORAL | 0 refills | Status: DC
  Filled 2022-11-13: qty 30, 30d supply, fill #0

## 2022-11-13 MED ORDER — TRAZODONE HCL 100 MG PO TABS
100.0000 mg | ORAL_TABLET | Freq: Every evening | ORAL | 0 refills | Status: DC | PRN
  Filled 2022-11-13: qty 30, 30d supply, fill #0

## 2022-11-13 MED ORDER — GABAPENTIN 100 MG PO CAPS
100.0000 mg | ORAL_CAPSULE | Freq: Three times a day (TID) | ORAL | 0 refills | Status: DC
  Filled 2022-11-13: qty 90, 30d supply, fill #0

## 2022-11-15 ENCOUNTER — Other Ambulatory Visit (HOSPITAL_COMMUNITY): Payer: Self-pay

## 2022-11-15 ENCOUNTER — Other Ambulatory Visit: Payer: Self-pay

## 2022-12-04 ENCOUNTER — Other Ambulatory Visit (HOSPITAL_COMMUNITY): Payer: Self-pay

## 2022-12-04 ENCOUNTER — Other Ambulatory Visit: Payer: Self-pay | Admitting: Internal Medicine

## 2022-12-04 DIAGNOSIS — K5903 Drug induced constipation: Secondary | ICD-10-CM

## 2022-12-07 ENCOUNTER — Other Ambulatory Visit: Payer: Self-pay

## 2022-12-07 ENCOUNTER — Other Ambulatory Visit (HOSPITAL_COMMUNITY): Payer: Self-pay

## 2022-12-07 MED FILL — Linaclotide Cap 145 MCG: ORAL | 30 days supply | Qty: 30 | Fill #0 | Status: AC

## 2022-12-07 NOTE — Telephone Encounter (Signed)
error 

## 2022-12-09 ENCOUNTER — Other Ambulatory Visit (HOSPITAL_COMMUNITY): Payer: Self-pay

## 2022-12-11 ENCOUNTER — Other Ambulatory Visit (HOSPITAL_COMMUNITY): Payer: Self-pay

## 2022-12-13 ENCOUNTER — Other Ambulatory Visit: Payer: Self-pay

## 2023-01-01 ENCOUNTER — Other Ambulatory Visit (HOSPITAL_COMMUNITY): Payer: Self-pay

## 2023-01-01 MED FILL — Linaclotide Cap 145 MCG: ORAL | 30 days supply | Qty: 30 | Fill #1 | Status: AC

## 2023-01-02 ENCOUNTER — Other Ambulatory Visit (HOSPITAL_COMMUNITY): Payer: Self-pay

## 2023-02-05 ENCOUNTER — Other Ambulatory Visit (HOSPITAL_COMMUNITY): Payer: Self-pay

## 2023-02-05 ENCOUNTER — Other Ambulatory Visit: Payer: Self-pay | Admitting: Internal Medicine

## 2023-02-05 DIAGNOSIS — T402X5A Adverse effect of other opioids, initial encounter: Secondary | ICD-10-CM

## 2023-02-09 ENCOUNTER — Other Ambulatory Visit (HOSPITAL_COMMUNITY): Payer: Self-pay

## 2023-02-09 ENCOUNTER — Other Ambulatory Visit: Payer: Self-pay

## 2023-02-09 DIAGNOSIS — T402X5A Adverse effect of other opioids, initial encounter: Secondary | ICD-10-CM

## 2023-02-09 MED ORDER — LINACLOTIDE 145 MCG PO CAPS
145.0000 ug | ORAL_CAPSULE | Freq: Every day | ORAL | 3 refills | Status: DC
  Filled 2023-02-09: qty 30, 30d supply, fill #0
  Filled 2023-03-03: qty 30, 30d supply, fill #1
  Filled 2023-04-02: qty 30, 30d supply, fill #2
  Filled 2023-05-07: qty 30, 30d supply, fill #3

## 2023-02-12 ENCOUNTER — Other Ambulatory Visit (HOSPITAL_COMMUNITY): Payer: Self-pay

## 2023-02-28 ENCOUNTER — Encounter: Payer: MEDICAID | Admitting: Internal Medicine

## 2023-03-03 ENCOUNTER — Other Ambulatory Visit (HOSPITAL_COMMUNITY): Payer: Self-pay

## 2023-03-08 ENCOUNTER — Other Ambulatory Visit: Payer: Self-pay

## 2023-03-08 ENCOUNTER — Ambulatory Visit: Payer: MEDICAID | Admitting: Student

## 2023-03-08 ENCOUNTER — Other Ambulatory Visit (HOSPITAL_COMMUNITY): Payer: Self-pay

## 2023-03-08 VITALS — BP 141/81 | HR 90 | Temp 98.2°F | Ht 72.0 in | Wt 317.7 lb

## 2023-03-08 DIAGNOSIS — F17210 Nicotine dependence, cigarettes, uncomplicated: Secondary | ICD-10-CM | POA: Diagnosis not present

## 2023-03-08 DIAGNOSIS — R030 Elevated blood-pressure reading, without diagnosis of hypertension: Secondary | ICD-10-CM | POA: Diagnosis not present

## 2023-03-08 DIAGNOSIS — R3989 Other symptoms and signs involving the genitourinary system: Secondary | ICD-10-CM | POA: Diagnosis not present

## 2023-03-08 DIAGNOSIS — G479 Sleep disorder, unspecified: Secondary | ICD-10-CM

## 2023-03-08 DIAGNOSIS — F17200 Nicotine dependence, unspecified, uncomplicated: Secondary | ICD-10-CM

## 2023-03-08 DIAGNOSIS — Z0000 Encounter for general adult medical examination without abnormal findings: Secondary | ICD-10-CM

## 2023-03-08 MED ORDER — NICOTINE POLACRILEX 4 MG MT GUM
4.0000 mg | CHEWING_GUM | OROMUCOSAL | 0 refills | Status: DC | PRN
  Filled 2023-03-08: qty 100, 30d supply, fill #0

## 2023-03-08 MED ORDER — NICOTINE 21 MG/24HR TD PT24
21.0000 mg | MEDICATED_PATCH | TRANSDERMAL | 1 refills | Status: AC
Start: 2023-03-08 — End: 2024-03-07
  Filled 2023-03-08: qty 28, 28d supply, fill #0

## 2023-03-08 MED ORDER — BUPROPION HCL ER (XL) 300 MG PO TB24
300.0000 mg | ORAL_TABLET | ORAL | 2 refills | Status: DC
  Filled 2023-03-08: qty 30, 30d supply, fill #0
  Filled 2023-04-02: qty 30, 30d supply, fill #1
  Filled 2023-06-22 – 2023-06-27 (×2): qty 30, 30d supply, fill #2

## 2023-03-08 NOTE — Progress Notes (Signed)
Established Patient Office Visit  Subjective   Patient ID: Victor Gomez, male    DOB: 1975-06-29  Age: 48 y.o. MRN: [id number]  Chief Complaint  Patient presents with   Follow-up    ROUTINE OFFICE VISIT FOR MEDICATION REFILL / SOMETHING HELP STOP SMOKING    Patient is a 48 y.o. with a past medical history stated below who presents today for follow-up for chronic medical conditions including tobacco use disorder, urinary problem, and daytime fatigue. He is accompanied by his mother today. Last [facility] visit 10/13/2022. Patient continues to follow with [facility]. Taking daily methadone 160 mg daily. Sent in refills for some medications.   Please see problem based assessment and plan for additional details.     Past Medical History:  Diagnosis Date   ADHD    Elevated transaminase level 03/13/2013   Hepatitis C    Hyperlipidemia    MDD (major depressive disorder)    Methadone use    clinic each day   Psychoactive substance-induced psychosis (HCC)    Substance induced mood disorder (HCC)    Tibia/fibula fracture, shaft, unspecified laterality, sequela 05/31/2017   Review of Systems  Constitutional:  Positive for malaise/fatigue.  Respiratory:  Negative for cough and shortness of breath.   Cardiovascular:  Negative for chest pain, palpitations and leg swelling.  Genitourinary:  Negative for dysuria.     Objective:     BP (!) 141/81 (BP Location: Left Arm, Patient Position: Sitting, Cuff Size: Large)   Pulse 90   Temp 98.2 F (36.8 C) (Oral)   Ht 6' (1.829 m)   Wt (!) 317 lb 11.2 oz (144.1 kg)   SpO2 95%   BMI 43.09 kg/m  BP Readings from Last 3 Encounters:  03/08/23 (!) 141/81  10/13/22 126/76  08/22/22 132/74   Wt Readings from Last 3 Encounters:  03/08/23 (!) 317 lb 11.2 oz (144.1 kg)  10/13/22 275 lb 14.4 oz (125.1 kg)  08/22/22 255 lb 14.4 oz (116.1 kg)   Physical Exam HENT:     Head: Normocephalic and atraumatic.  Cardiovascular:      Rate and Rhythm: Normal rate and regular rhythm.  Pulmonary:     Effort: Pulmonary effort is normal.     Breath sounds: Normal breath sounds.  Abdominal:     General: Bowel sounds are normal. There is no distension.     Palpations: Abdomen is soft.     Tenderness: There is no abdominal tenderness.  Musculoskeletal:        General: Normal range of motion.  Skin:    General: Skin is warm and dry.  Neurological:     Mental Status: He is alert. Mental status is at baseline.  Psychiatric:        Mood and Affect: Mood normal.        Behavior: Behavior normal.    No results found for any visits on 03/08/23.  Last metabolic panel Lab Results  Component Value Date   GLUCOSE 94 10/13/2022   NA 140 10/13/2022   K 4.6 10/13/2022   CL 106 10/13/2022   CO2 19 (L) 10/13/2022   BUN 19 10/13/2022   CREATININE 0.72 (L) 10/13/2022   EGFR 115 10/13/2022   CALCIUM 9.6 10/13/2022   PROT 6.9 07/15/2022   ALBUMIN 4.1 07/15/2022   LABGLOB 2.5 07/26/2021   AGRATIO 1.8 07/26/2021   BILITOT 0.8 07/15/2022   ALKPHOS 81 07/15/2022   AST 55 (H) 07/15/2022   ALT 18 07/15/2022  ANIONGAP 13 07/15/2022    The 10-year ASCVD risk score (Arnett DK, et al., 2019) is: 7.5%    Assessment & Plan:   Problem List Items Addressed This Visit     Tobacco use disorder - Primary   Patient endorses smoking about a pack a day since the age of 17. Has tried quitting in the past. Discussed medication assistance for smoking cessation including NRT and Chantix. Patient prefers starting with nicotine patch and/or gum as he is more familiar with them. Discussed the use of nicotine patch and gum together for improved control over cravings and patient is agreeable to trying both.  Plan - START nicotine patch 21 mg daily (reviewed instructions) - START nicotine gum 4 mg as needed (reviewed instructions and provided written information) - Return to clinic in 1 month for follow up       Relevant Orders   Fecal occult  blood, imunochemical   Healthcare maintenance   Patient agreed to [facility] for colon cancer screening. Will follow up as needed.  - FOBT kit today, patient to return to clinic      Urinary problem   Patient states he continues to have some trouble initiating urination, not urinating as often. Prior bladder scan with findings concerning for urinary retention. Discussed possible effect of benadryl on symptoms. Patient agreeable to maintaining a voiding diary (calendar/note handout provided) to review and discuss at next visit.  Plan - Patient to keep voiding diary  - Patient to stop benadryl use - Return to clinic in 1 month to discuss next steps      Sleep difficulties   Patient reports difficulties falling asleep and daytime fatigue. States he takes Nyquil to help sleep at night. Has Trazodone prescription but only takes it intermittently. Doesn't like the way he feels when he takes it. Of note, patient takes benadryl in the mornings for allergies. Did not realize it could make him drowsy in the day. Patient's BMI 43.09 kg/m2, snores at night. Discussed risk factors for OSA, patient denies prior sleep study/workup for sleep apnea (has had sleep study for seizures in the past). Agreeable to sleep medicine referral today. Provided with information on GLP-1 agonists for weight loss management, will think about it between now and his follow up visit.  Plan - Sleep medicine referral for OSA  - Recommend stopping benadryl use in the mornings and Nyquil use at night - Patient to consider medication assisted weight loss      Relevant Orders   Ambulatory referral to Sleep Studies   Elevated blood pressure reading   Elevated BP 141/81 today, not symptomatic. Not on antihypertensives. Discussed importance of keeping BP log between now and follow up visit in 1 month to help determine if medication therapy is indicated.  Plan - Return to clinic in 1 month for BP check, check BP log, determine if  antihypertensive therapy is needed      Patient discussed with Dr. Sol Blazing.  Return in about 4 weeks (around 04/05/2023) for Smoking cessation, sleep study follow up, urinary problems.   Takayla Baillie Colbert Coyer, MD

## 2023-03-08 NOTE — Patient Instructions (Addendum)
Thank you, [name] for allowing [place] to provide your care today. Today we discussed your smoking cessation, urinary problems, medications, and sleeping difficulties.   I have ordered the following labs for you:  Lab Orders         Fecal occult blood, imunochemical       Tests ordered today:  None  Referrals ordered today:   Referral Orders         Ambulatory referral to Sleep Studies       I have ordered the following medication/changed the following medications:   Stop the following medications: Medications Discontinued During This Encounter  Medication Reason   buPROPion (WELLBUTRIN XL) 150 MG 24 hr tablet    buPROPion (WELLBUTRIN XL) 150 MG 24 hr tablet    buPROPion (WELLBUTRIN XL) 150 MG 24 hr tablet    busPIRone (BUSPAR) 15 MG tablet    gabapentin (NEURONTIN) 100 MG capsule    hydrOXYzine (ATARAX) 50 MG tablet    OLANZapine (ZYPREXA) 15 MG tablet    OLANZapine (ZYPREXA) 15 MG tablet    traZODone (DESYREL) 100 MG tablet      Start the following medications: Meds ordered this encounter  Medications   buPROPion (WELLBUTRIN XL) 300 MG 24 hr tablet    Sig: Take 1 tablet (300 mg total) by mouth every morning.    Dispense:  30 tablet    Refill:  2   nicotine (NICODERM CQ - DOSED IN MG/24 HOURS) 21 mg/24hr patch    Sig: Place 1 patch (21 mg total) onto the skin daily.    Dispense:  30 patch    Refill:  1   nicotine polacrilex (NICORETTE) 4 MG gum    Sig: Take 1 each (4 mg total) by mouth as needed for smoking cessation.    Dispense:  100 tablet    Refill:  0     Follow up: 1 month   Remember:   - PLEASE keep a calendar of your urinary problems. STOP taking benadryl in the morning, as this medication can cause urinary retention.   - You have been referred for a sleep study to help determine if you have sleep apnea. They will call you to set it up.   - I will call you with the results of your FOBT (for colon screening). Please be sure to follow the  directions provided.   - We prescribed a nicotine patch that you will change out every 24 hours. We also prescribed nicotine gum that you can take (1 piece each hour as needed for breakthrough cravings). Please do not smoke while on the nicotine patch. I have attached information on both for you.   - Please make an appointment with [place] in 1 month to follow up on these concerns.   - I've attached information on weight loss injection drugs. You can read about it and discuss at your next visit.   Should you have any questions or concerns please call the internal medicine clinic at [phone].     Victor Garcilazo Colbert Coyer, MD PGY-1 Internal Medicine Teaching Progam [facility]

## 2023-03-10 DIAGNOSIS — R030 Elevated blood-pressure reading, without diagnosis of hypertension: Secondary | ICD-10-CM | POA: Insufficient documentation

## 2023-03-10 DIAGNOSIS — G479 Sleep disorder, unspecified: Secondary | ICD-10-CM | POA: Insufficient documentation

## 2023-03-10 DIAGNOSIS — R5383 Other fatigue: Secondary | ICD-10-CM | POA: Insufficient documentation

## 2023-03-10 NOTE — Assessment & Plan Note (Addendum)
Patient agreed to [facility] for colon cancer screening. Will follow up as needed.  - FOBT kit today, patient to return to clinic

## 2023-03-10 NOTE — Assessment & Plan Note (Signed)
Elevated BP 141/81 today, not symptomatic. Not on antihypertensives. Discussed importance of keeping BP log between now and follow up visit in 1 month to help determine if medication therapy is indicated.  Plan - Return to clinic in 1 month for BP check, check BP log, determine if antihypertensive therapy is needed

## 2023-03-10 NOTE — Assessment & Plan Note (Signed)
Patient endorses smoking about a pack a day since the age of 74. Has tried quitting in the past. Discussed medication assistance for smoking cessation including NRT and Chantix. Patient prefers starting with nicotine patch and/or gum as he is more familiar with them. Discussed the use of nicotine patch and gum together for improved control over cravings and patient is agreeable to trying both.  Plan - START nicotine patch 21 mg daily (reviewed instructions) - START nicotine gum 4 mg as needed (reviewed instructions and provided written information) - Return to clinic in 1 month for follow up

## 2023-03-10 NOTE — Assessment & Plan Note (Signed)
Patient states he continues to have some trouble initiating urination, not urinating as often. Prior bladder scan with findings concerning for urinary retention. Discussed possible effect of benadryl on symptoms. Patient agreeable to maintaining a voiding diary (calendar/note handout provided) to review and discuss at next visit.  Plan - Patient to keep voiding diary  - Patient to stop benadryl use - Return to clinic in 1 month to discuss next steps

## 2023-03-10 NOTE — Assessment & Plan Note (Signed)
Patient reports difficulties falling asleep and daytime fatigue. States he takes Nyquil to help sleep at night. Has Trazodone prescription but only takes it intermittently. Doesn't like the way he feels when he takes it. Of note, patient takes benadryl in the mornings for allergies. Did not realize it could make him drowsy in the day. Patient's BMI 43.09 kg/m2, snores at night. Discussed risk factors for OSA, patient denies prior sleep study/workup for sleep apnea (has had sleep study for seizures in the past). Agreeable to sleep medicine referral today. Provided with information on GLP-1 agonists for weight loss management, will think about it between now and his follow up visit.  Plan - Sleep medicine referral for OSA  - Recommend stopping benadryl use in the mornings and Nyquil use at night - Patient to consider medication assisted weight loss

## 2023-03-12 NOTE — Progress Notes (Signed)
Internal Medicine Clinic Attending  Case discussed with the resident at the time of the visit.  We reviewed the resident's history and exam and pertinent patient test results.  I agree with the assessment, diagnosis, and plan of care documented in the resident's note.

## 2023-03-14 ENCOUNTER — Emergency Department (HOSPITAL_COMMUNITY)
Admission: EM | Admit: 2023-03-14 | Discharge: 2023-03-14 | Discharge status: Against Medical Advice | Payer: MEDICAID | Attending: Emergency Medicine | Admitting: Emergency Medicine

## 2023-03-14 ENCOUNTER — Encounter (HOSPITAL_COMMUNITY): Payer: Self-pay | Admitting: *Deleted

## 2023-03-14 ENCOUNTER — Other Ambulatory Visit: Payer: Self-pay

## 2023-03-14 DIAGNOSIS — R339 Retention of urine, unspecified: Secondary | ICD-10-CM | POA: Insufficient documentation

## 2023-03-14 DIAGNOSIS — Z5321 Procedure and treatment not carried out due to patient leaving prior to being seen by health care provider: Secondary | ICD-10-CM | POA: Insufficient documentation

## 2023-03-14 HISTORY — DX: Calculus of kidney: N20.0

## 2023-03-14 NOTE — ED Triage Notes (Signed)
Pt reports that he has had trouble urinating since just after lunch. He says that he went to the bathroom in the lobby when he arrived and was able to urinate, and says he feels like he emptied his bladder. He was having pain when he could not urinate, but no longer having pain. Denies n/v.

## 2023-03-15 ENCOUNTER — Other Ambulatory Visit (HOSPITAL_COMMUNITY): Payer: Self-pay

## 2023-03-29 ENCOUNTER — Ambulatory Visit: Payer: MEDICAID | Admitting: Neurology

## 2023-03-29 ENCOUNTER — Encounter: Payer: Self-pay | Admitting: Neurology

## 2023-03-29 VITALS — BP 126/80 | HR 102 | Ht 72.0 in | Wt 313.6 lb

## 2023-03-29 DIAGNOSIS — Z9189 Other specified personal risk factors, not elsewhere classified: Secondary | ICD-10-CM | POA: Diagnosis not present

## 2023-03-29 DIAGNOSIS — G4719 Other hypersomnia: Secondary | ICD-10-CM | POA: Diagnosis not present

## 2023-03-29 DIAGNOSIS — E66813 Obesity, class 3: Secondary | ICD-10-CM

## 2023-03-29 DIAGNOSIS — Z6841 Body Mass Index (BMI) 40.0 and over, adult: Secondary | ICD-10-CM

## 2023-03-29 DIAGNOSIS — F1910 Other psychoactive substance abuse, uncomplicated: Secondary | ICD-10-CM

## 2023-03-29 DIAGNOSIS — F17210 Nicotine dependence, cigarettes, uncomplicated: Secondary | ICD-10-CM | POA: Diagnosis not present

## 2023-03-29 DIAGNOSIS — E661 Drug-induced obesity: Secondary | ICD-10-CM

## 2023-03-29 NOTE — Patient Instructions (Addendum)
Sleep Apnea Test: What to Expect  Sleep apnea is a condition that affects your breathing while you're sleeping. You may have shallow breathing or stop breathing for short periods of time. Sleep apnea screening is a test to check if you're at risk for sleep apnea. The test includes questions. It will only takes a few minutes. Your health care provider may ask you to have this test before a surgery or as part of a physical exam. What are the symptoms of sleep apnea? Snoring. Waking up often at night. Daytime sleepiness. Pauses in breathing. Choking or gasping during sleep. Being annoyed easily. Forgetfulness. Trouble thinking clearly. Depression. Personality changes. Headaches in the morning. Most people with sleep apnea do not know that they have it. What are the advantages of sleep apnea screening? Getting screened for sleep apnea can help: Keep you safer. Your providers need to know whether or not you have sleep apnea, especially if you're having surgery or have other long-term, or chronic, health conditions. Improve your health and help you get a better night's rest. Restful sleep can help you: Have more energy. Lose weight. Improve high blood pressure. Improve diabetes management. Prevent stroke. Prevent car accidents. What happens before the screening? You may talk with your provider about the screening and what other tests may be recommended based on the screening. What happens during the screening? Screening usually includes being asked a list of questions about your sleep quality. Some questions you may be asked include: Do you snore? Is your sleep restless? Do you have daytime sleepiness? Has a partner or spouse told you that you stop breathing, choke, or gasp during sleep? Have you had trouble concentrating or memory loss? What is your age? What is your neck circumference? To measure your neck, keep your back straight and gently wrap the tape measure around your neck.  Put the tape measure at the middle of your neck, between your chin and collarbone. What is your sex assigned at birth? Do you have high blood pressure or are you being treated for high blood pressure? If your screening test is positive, you're at risk for the condition. More tests may be needed to confirm a diagnosis of sleep apnea. What can I expect after the screening? Your provider will go over the results of the screening with you and make recommendations based on the results of the test. Where to find more information You can find screening tools online or at your health care clinic. To learn more, go to these websites: Centers for Disease Control and Prevention: DiningCalendar.de. Then: Click Health Topics A-Z. Type "sleep apnea" in the search box. National Heart, Lung, and Blood Institute: BuffaloDryCleaner.gl Contact a health care provider if: You think that you may have sleep apnea. This information is not intended to replace advice given to you by your health care provider. Make sure you discuss any questions you have with your health care provider. Document Revised: 07/08/2022 Document Reviewed: 07/08/2022 Elsevier Patient Education  2024 Elsevier Inc.   Risk of hypoxia, of hypoventilation, snoring with OSA, ) 2)He  underestimated his sleepiness ,  EDS -  rather 14 points, FSS is also high.  HST was preferred by patient and parent, after we discussed the implications of in -lab testing.  HST ordered.   I plan to follow up through our NP within 4-6 months.   I would like to thank Lovie Macadamia, MD and Reymundo Poll, Md [address] [place],  [place] [address] for  this referral.

## 2023-03-29 NOTE — Progress Notes (Signed)
SLEEP MEDICINE CLINIC    Provider:  Melvyn Novas, MD  Primary Care Physician:  Lovie Macadamia, MD [address] [place] [address]     Referring Provider: [facility]   Reymundo Poll, Md [address] [place],  [place] [address]          Chief Complaint according to patient   Patient presents with:     New Sleep Patient (Initial Visit)           HISTORY OF PRESENT ILLNESS:  Victor Gomez is a 48 y.o. male patient who is seen upon referral on 03/29/2023 from [facility] Internal medicine clinic for a sleep consult,. He is unsure why he is here. He is accompanied by his mother who redirects the conversation  and is not always allowing the patient to answer.   .  Chief concern according to patient :  none    I have the pleasure of seeing Victor Gomez 03/29/23 a right-handed male  patient  who seems fatigued, is obese and smokes, and has a HX of opiate addiction/ hx of methadone for the last 4 years,  Linzess for opiate constipation, with a possible sleep disorder.  Learning disability in school, psychomotor slowing.  ADD/  and no seizures. Reading level 2nd grade.    No previous sleep care/ sleep testing.    Sleep relevant medical history:  Diagnosis Date   ADHD     Hepatitis C     Hyperlipidemia     MDD (major depressive disorder)     Methadone use      clinic each day   Psychoactive substance-induced psychosis (HCC)     Substance induced mood disorder (HCC)      ROS : Nocturia 3-4 times (!) . Sleep walking in childhood, sleep eating, ,had Night terrors, NO Tonsillectomy, no TBI- learning and developmentally cognitively - intellectually  disabled, edentulous since 2019 , at age 49 .   Family medical /sleep history:  No Known family member on CPAP with OSA, insomnia, sleep walkers.    Social history:  Patient is not employed,  last employed in 2008,  and lives in a household with mother.  Family status is single , with one 72 year-old  daughter, Pets are present. Cats and dogs . Tobacco use: 1 ppd fo decades, .  ETOH use : none ,  substance use ; Yes .  Caffeine intake in form of Coffee( 2 mugs a day) Soda( 4 liters a week) Tea ( /)  no energy drinks Exercise in form of walking his dog.  Hobbies : fishing.       Sleep habits are as follows: The patient's dinner time is between 5-7  PM. The patient goes to bed at 10 PM and sleeps on the couch, living room.  Uncomfortable couch-  on 4 pillows.  The room is warm, not  dark, not quiet. TV is on all night.   continues to sleep from 11 Pm to 4.30 AM,  for 5.5  hours, wakes for 2-4  bathroom breaks, the first time at 2 AM.   The preferred sleep position is right sided, with the support of 4 pillows. Congestion is chronic, he is smoking but his mother advised he is asthmatic (!) due to allergies,  has frequent sinusitis.  Dreams are reportedly frequent/vivid.   The patient wakes up spontaneously at 4.30 -the usual rise time. He/ She reports not feeling refreshed or restored in  AM, with symptoms such as dry mouth, no morning headaches, and residual fatigue. Naps are taken daily frequently, lasting from 12 to 1 PM for about 45  minutes and are more/less refreshing than nocturnal sleep.    Review of Systems: Out of a complete 14 system review, the patient complains of only the following symptoms, and all other reviewed systems are negative.:  Fatigue, sleepiness , snoring, fragmented sleep, Insomnia, RLS, Nocturia  Patient reported increased appetite, hyperphagia, weight gain, polydipsia and polyuria. Inquired about treatment with metformin.    How likely are you to doze in the following situations: 0 = not likely, 1 = slight chance, 2 = moderate chance, 3 = high chance   Sitting and Reading? Watching Television? Sitting inactive in a public place (theater or meeting)? As a passenger in a car for an hour without a break? Lying down in the afternoon when circumstances  permit? Sitting and talking to someone? Sitting quietly after lunch without alcohol? In a car, while stopped for a few minutes in traffic?   Total = 10- 15 / 24 points   FSS endorsed at 53/ 63 points.   Dysphonia,  sleepy, slow speaking.  Dysarthria from lack of teeth.   Social History   Socioeconomic History   Marital status: Legally Separated    Spouse name: Not on file   Number of children: 1   Years of education: Not on file   Highest education level: 10th grade  Occupational History   Not on file  Tobacco Use   Smoking status: Every Day    Current packs/day: 0.50    Types: Cigarettes   Smokeless tobacco: Never   Tobacco comments:    1 pack every 3 days   Vaping Use   Vaping status: Never Used  Substance and Sexual Activity   Alcohol use: Yes   Drug use: Not Currently    Types: Marijuana, "Crack" cocaine, Methamphetamines, Heroin    Comment: last time he used 04/2014   Sexual activity: Not on file  Other Topics Concern   Not on file  Social History Narrative   Right handed    Drinks coffee 2 cups per day   Social Drivers of Health   Financial Resource Strain: Not on file  Food Insecurity: No Food Insecurity (03/08/2023)   Hunger Vital Sign    Worried About Running Out of Food in the Last Year: Never true    Ran Out of Food in the Last Year: Never true  Transportation Needs: No Transportation Needs (03/08/2023)   PRAPARE - Administrator, Civil Service (Medical): No    Lack of Transportation (Non-Medical): No  Physical Activity: Not on file  Stress: Not on file  Social Connections: Moderately Isolated (03/08/2023)   Social Connection and Isolation Panel [NHANES]    Frequency of Communication with Friends and Family: Three times a week    Frequency of Social Gatherings with Friends and Family: Not on file    Attends Religious Services: More than 4 times per year    Active Member of [employer] or Organizations: No    Attends Banker  Meetings: Never    Marital Status: Divorced    Family History  Problem Relation Age of Onset   Diabetes Maternal Grandfather     Past Medical History:  Diagnosis Date   ADHD    Elevated transaminase level 03/13/2013   Hepatitis C    Hyperlipidemia    Kidney stone    MDD (major  depressive disorder)    Methadone use    clinic each day   Psychoactive substance-induced psychosis (HCC)    Substance induced mood disorder (HCC)    Tibia/fibula fracture, shaft, unspecified laterality, sequela 05/31/2017    Past Surgical History:  Procedure Laterality Date   HERNIA REPAIR       Current Outpatient Medications on File Prior to Visit  Medication Sig Dispense Refill   buPROPion (WELLBUTRIN XL) 300 MG 24 hr tablet Take 1 tablet (300 mg total) by mouth every morning. 30 tablet 2   busPIRone (BUSPAR) 15 MG tablet Take 1 tablet (15 mg total) by mouth 3 (three) times daily. 90 tablet 1   gabapentin (NEURONTIN) 300 MG capsule Take 1 capsule (300 mg total) by mouth 3 (three) times daily. 90 capsule 1   linaclotide (LINZESS) 145 MCG CAPS capsule Take 1 capsule (145 mcg total) by mouth daily before breakfast. 30 capsule 3   naproxen (NAPROSYN) 500 MG tablet Take 1 tablet (500 mg total) by mouth as needed. 60 tablet 2   nicotine polacrilex (NICORETTE) 4 MG gum Take 1 each (4 mg total) by mouth as needed for smoking cessation. 100 tablet 0   OLANZapine (ZYPREXA) 15 MG tablet Take 1 tablet (15 mg total) by mouth 2 (two) times daily. 60 tablet 1   rosuvastatin (CRESTOR) 20 MG tablet Take 1 tablet (20 mg total) by mouth daily. 30 tablet 11   traZODone (DESYREL) 50 MG tablet Take 1 tablet (50 mg total) by mouth every evening. 30 tablet 1   TYLENOL 500 MG tablet Take 500-1,000 mg by mouth every 6 (six) hours as needed for mild pain or headache.     nicotine (NICODERM CQ - DOSED IN MG/24 HOURS) 21 mg/24hr patch Place 1 patch (21 mg total) onto the skin daily. (Patient not taking: Reported on 03/29/2023) 30  patch 1   [DISCONTINUED] ARIPiprazole (ABILIFY) 10 MG tablet Take 1 tablet (10 mg total) by mouth at bedtime. (Patient not taking: Reported on 07/15/2022) 30 tablet 2   [DISCONTINUED] atorvastatin (LIPITOR) 40 MG tablet Take 1 tablet (40 mg total) by mouth daily. 30 tablet 11   No current facility-administered medications on file prior to visit.    Allergies  Allergen Reactions   Abilify [Aripiprazole] Other (See Comments)    Made the patient feel like he was experiencing "withdrawal symptoms"   Trazodone And Nefazodone Other (See Comments)    Made the patient feel "hungover" the day after taking it     DIAGNOSTIC DATA (LABS, IMAGING, TESTING) - I reviewed patient records, labs, notes, testing and imaging myself where available.  Lab Results  Component Value Date   WBC 8.7 07/15/2022   HGB 15.2 07/15/2022   HCT 46.5 07/15/2022   MCV 86.8 07/15/2022   PLT 196 07/15/2022      Component Value Date/Time   NA 140 10/13/2022 1130   K 4.6 10/13/2022 1130   CL 106 10/13/2022 1130   CO2 19 (L) 10/13/2022 1130   GLUCOSE 94 10/13/2022 1130   GLUCOSE 117 (H) 07/15/2022 1600   BUN 19 10/13/2022 1130   CREATININE 0.72 (L) 10/13/2022 1130   CREATININE 0.96 08/19/2020 0000   CALCIUM 9.6 10/13/2022 1130   PROT 6.9 07/15/2022 1600   PROT 6.9 07/26/2021 1646   ALBUMIN 4.1 07/15/2022 1600   ALBUMIN 4.4 07/26/2021 1646   AST 55 (H) 07/15/2022 1600   ALT 18 07/15/2022 1600   ALKPHOS 81 07/15/2022 1600   BILITOT 0.8 07/15/2022  1600   BILITOT <0.2 07/26/2021 1646   GFRNONAA >60 07/15/2022 1600   GFRAA >60 02/19/2018 2003   Lab Results  Component Value Date   CHOL 249 (H) 08/22/2022   HDL 59 08/22/2022   LDLCALC 110 (H) 08/22/2022   TRIG 469 (H) 08/22/2022   CHOLHDL 4.2 08/22/2022   Lab Results  Component Value Date   HGBA1C 5.4 10/13/2022   No results found for: "VITAMINB12" No results found for: "TSH"  PHYSICAL EXAM:  Today's Vitals   03/29/23 1002  BP: 126/80  Pulse: (!)  102  Weight: (!) 313 lb 9.6 oz (142.2 kg)  Height: 6' (1.829 m)   Body mass index is 42.53 kg/m.   Wt Readings from Last 3 Encounters:  03/29/23 (!) 313 lb 9.6 oz (142.2 kg)  03/14/23 (!) 317 lb (143.8 kg)  03/08/23 (!) 317 lb 11.2 oz (144.1 kg)     Ht Readings from Last 3 Encounters:  03/29/23 6' (1.829 m)  03/14/23 6' (1.829 m)  03/08/23 6' (1.829 m)      General: The patient is awake, alert and appears not in acute distress. The patient is well groomed. Head: Normocephalic, atraumatic. Neck is supple.  Mallampati 3 , thrush from nicotine gum. ,  neck circumference:18 inches . Nasal airflow not patent.   Retrognathia is not seen.  Facial hair  Dental status: NONE LEFT Cardiovascular:  Regular rate and cardiac rhythm by pulse,  without distended neck veins. Respiratory: Lungs are clear to auscultation.  Skin:  Without evidence of ankle edema, or rash. Trunk: The patient' is obese.    NEUROLOGIC EXAM: The patient is awake and alert, oriented to place and time.   Memory subjective described as intact.  Attention span & concentration ability appears normal.  Speech is  slow but fluent,  without dysphonia or aphasia.  Mood and affect are appropriate.   Cranial nerves: no loss of smell or taste reported  Pupils are equal in size and round, they are 3-4 mm reactive to light. Funduscopic exam - no palor, edema. .  Extraocular movements in vertical and horizontal planes were intact and without nystagmus. No Diplopia. Visual fields by finger perimetry are intact. Hearing was intact to soft voice and finger rubbing.    Facial sensation intact to fine touch.  Facial motor strength is symmetric and tongue in midline.  Neck ROM : rotation, tilt and flexion extension were normal for age and shoulder shrug was symmetrical.    Motor exam:  Symmetric bulk, tone and ROM.   Diffuse elevated tone, biceps, wrist and triceps - unable to fully relax-  without cog wheeling, symmetric grip  strength .   Sensory:  Fine touch and vibration were felt normal Proprioception tested in the upper extremities was normal.   Coordination: Rapid alternating movements in the fingers/hands were of normal speed.  The Finger-to-nose maneuver was slow , due to left and right confusion. without evidence of ataxia, dysmetria or tremor.   Gait and station: Patient could rise unassisted from a seated position, walked without assistive device.  Stance is of  wide base and the patient turned with 3 steps.  Toe and heel walk were deferred.  Deep tendon reflexes: in the  upper and lower extremities are symmetric and intact.  Babinski response was deferred    ASSESSMENT AND PLAN 48 y.o.  male  patient with  intellectual disabilities, ADD, a long history of substance abuse here with:    1) Poor sleep habits and  hygiene, aged beyond his years, smoking heavily, (  morbidly obese and at risk for OSA and CSA due to opiate  methadone use. Risk of hypoxia, of hypoventilation, snoring with OSA, ) 2)He  underestimated his sleepiness ,  EDS -  rather 14 points, FSS is also high.  HST was preferred by patient and parent, after we discussed the implications of in -lab testing.    I plan to follow up through our NP within 4-6 months.   I would like to thank Lovie Macadamia, MD and Reymundo Poll, Md [address] [place],  [place] [address] for allowing me to meet with and to take care of this pleasant patient.     After spending a total time of  45  minutes face to face and additional time for physical and neurologic examination, review of laboratory studies,  personal review of imaging studies, reports and results of other testing and review of referral information / records as far as provided in visit,   Electronically signed by: Melvyn Novas, MD 03/29/2023 10:17 AM  [facility] Board certified by The [employer] of Sleep Medicine and Diplomate of the  [employer] of Sleep Medicine. Board certified In Neurology through the ABPN, Fellow of the [employer] of Neurology.

## 2023-04-02 ENCOUNTER — Other Ambulatory Visit: Payer: Self-pay | Admitting: Student

## 2023-04-02 ENCOUNTER — Other Ambulatory Visit: Payer: Self-pay

## 2023-04-02 ENCOUNTER — Other Ambulatory Visit (HOSPITAL_COMMUNITY): Payer: Self-pay

## 2023-04-02 MED ORDER — NICOTINE POLACRILEX 4 MG MT GUM
4.0000 mg | CHEWING_GUM | OROMUCOSAL | 0 refills | Status: AC | PRN
  Filled 2023-04-02: qty 100, 30d supply, fill #0

## 2023-04-06 ENCOUNTER — Ambulatory Visit: Payer: MEDICAID | Admitting: Student

## 2023-04-06 ENCOUNTER — Ambulatory Visit: Payer: MEDICAID | Admitting: Neurology

## 2023-04-06 VITALS — BP 133/86 | HR 98 | Temp 97.6°F | Ht 72.0 in | Wt 310.9 lb

## 2023-04-06 DIAGNOSIS — F17210 Nicotine dependence, cigarettes, uncomplicated: Secondary | ICD-10-CM

## 2023-04-06 DIAGNOSIS — F17200 Nicotine dependence, unspecified, uncomplicated: Secondary | ICD-10-CM

## 2023-04-06 DIAGNOSIS — F332 Major depressive disorder, recurrent severe without psychotic features: Secondary | ICD-10-CM

## 2023-04-06 DIAGNOSIS — R3989 Other symptoms and signs involving the genitourinary system: Secondary | ICD-10-CM | POA: Diagnosis not present

## 2023-04-06 DIAGNOSIS — F1420 Cocaine dependence, uncomplicated: Secondary | ICD-10-CM

## 2023-04-06 DIAGNOSIS — Z9189 Other specified personal risk factors, not elsewhere classified: Secondary | ICD-10-CM

## 2023-04-06 DIAGNOSIS — F1910 Other psychoactive substance abuse, uncomplicated: Secondary | ICD-10-CM

## 2023-04-06 DIAGNOSIS — R768 Other specified abnormal immunological findings in serum: Secondary | ICD-10-CM

## 2023-04-06 DIAGNOSIS — G4719 Other hypersomnia: Secondary | ICD-10-CM

## 2023-04-06 DIAGNOSIS — G4710 Hypersomnia, unspecified: Secondary | ICD-10-CM | POA: Diagnosis not present

## 2023-04-06 DIAGNOSIS — E66813 Obesity, class 3: Secondary | ICD-10-CM

## 2023-04-06 NOTE — Assessment & Plan Note (Signed)
Patient returns for 1 month follow-up after having bothersome urinary hesitancy and nocturia that were exacerbated by use of diphenhydramine.  Since stopping the diphenhydramine his symptoms have improved and he is no longer having any urinary hesitancy.  He does have nocturia about 3 or 4 times at night but attributes this to sleeping on the couch and after discussion of medications with the risks benefits and alternatives he would like to hold off on starting medication at this time.  Also discussed prostate cancer risk which he is at a low risk due to no family history of prostate cancer other than his great grandfather who had prostate cancer around age 20.  I did recommend decreasing fluid intake in the 2 hours before bedtime. - Lifestyle modifications, he can call and ask for tamsulosin which I would start at 0.4 mg daily

## 2023-04-06 NOTE — Patient Instructions (Signed)
  Thank you, [name], for allowing [place] to provide your care today.  I am very glad that the nicotine gum has worked well to decrease your smoking.  I think adding the patch is going to make a very big difference.  If you are not ready to quit today I would recommend setting a quit date in the next 2 to 3 weeks and on that day make sure that you do not have any cigarettes in the house and start using the patches.  You can continue to use the gum with the patches as you have been for any breakthrough symptoms.  For your urination I am glad that your symptoms of gotten a lot better and the medication that we talked about is called tamsulosin also known as Flomax.  If you think your symptoms are bothering you enough to start a medication just call our clinic and we can send it in for you.  I hope everything goes well with the sleep study and quitting smoking.  If you need anything before your follow-up in 3 months please do not hesitate to call.  Follow up: 3 months    Remember:     Should you have any questions or concerns please call the internal medicine clinic at [phone].     Rocky Morel, DO [facility]

## 2023-04-06 NOTE — Assessment & Plan Note (Signed)
Since his last visit a month ago he started using nicotine gum and has decreased his smoking from 1 pack/day to 1/2 pack/day.  He is not using the nicotine patches at the moment.  He does think that he can stop smoking and is willing to try the patches.  Recommended to start the patches on his quit day and to ensure that there are no cigarettes in the house on that day. - Continue with nicotine gum, set a quit date and start nicotine patches

## 2023-04-06 NOTE — Progress Notes (Signed)
   CC: Routine Follow Up for tobacco use disorder and LUTS after last office visit 03/08/2023  HPI:  LEEVON Gomez is a 48 y.o. male with pertinent PMH of bipolar disorder, tobacco use disorder, class III obesity, and hyperlipidemia who presents as above. Please see assessment and plan below for further details.  Review of Systems:   Pertinent items noted in HPI and/or A&P.  Physical Exam:  Vitals:   04/06/23 0952  BP: 133/86  Pulse: 98  Temp: 97.6 F (36.4 C)  TempSrc: Oral  SpO2: 96%  Weight: (!) 310 lb 14.4 oz (141 kg)  Height: 6' (1.829 m)    Constitutional: Well-appearing adult male. In no acute distress. Pulm:. Normal work of breathing on room air. HYQ:MVHQIONG for extremity edema. Skin:Warm and dry. Neuro:Alert and oriented x3. No focal deficit noted. Psych:Pleasant mood and affect.   Assessment & Plan:   Urinary problem Patient returns for 1 month follow-up after having bothersome urinary hesitancy and nocturia that were exacerbated by use of diphenhydramine.  Since stopping the diphenhydramine his symptoms have improved and he is no longer having any urinary hesitancy.  He does have nocturia about 3 or 4 times at night but attributes this to sleeping on the couch and after discussion of medications with the risks benefits and alternatives he would like to hold off on starting medication at this time.  Also discussed prostate cancer risk which he is at a low risk due to no family history of prostate cancer other than his great grandfather who had prostate cancer around age 46.  I did recommend decreasing fluid intake in the 2 hours before bedtime. - Lifestyle modifications, he can call and ask for tamsulosin which I would start at 0.4 mg daily  Tobacco use disorder Since his last visit a month ago he started using nicotine gum and has decreased his smoking from 1 pack/day to 1/2 pack/day.  He is not using the nicotine patches at the moment.  He does think that he can  stop smoking and is willing to try the patches.  Recommended to start the patches on his quit day and to ensure that there are no cigarettes in the house on that day. - Continue with nicotine gum, set a quit date and start nicotine patches    Patient discussed with Dr. Kris Mouton, DO Internal Medicine Center Internal Medicine Resident PGY-2 Clinic Phone: [phone] Pager: [phone]

## 2023-04-08 LAB — FECAL OCCULT BLOOD, IMMUNOCHEMICAL: Fecal Occult Bld: POSITIVE — AB

## 2023-04-09 NOTE — Progress Notes (Signed)
 Internal Medicine Clinic Attending  Case discussed with the resident at the time of the visit.  We reviewed the resident's history and exam and pertinent patient test results.  I agree with the assessment, diagnosis, and plan of care documented in the resident's note.

## 2023-04-12 ENCOUNTER — Other Ambulatory Visit: Payer: Self-pay | Admitting: Student

## 2023-04-12 DIAGNOSIS — R195 Other fecal abnormalities: Secondary | ICD-10-CM

## 2023-04-13 NOTE — Progress Notes (Signed)
 Called patient and referred to GI for colonoscopy. All questions answered.

## 2023-05-07 ENCOUNTER — Other Ambulatory Visit (HOSPITAL_COMMUNITY): Payer: Self-pay

## 2023-05-28 ENCOUNTER — Other Ambulatory Visit (HOSPITAL_COMMUNITY): Payer: Self-pay

## 2023-05-28 ENCOUNTER — Ambulatory Visit: Payer: MEDICAID | Admitting: Student

## 2023-05-28 ENCOUNTER — Encounter: Payer: Self-pay | Admitting: Pediatrics

## 2023-05-28 ENCOUNTER — Ambulatory Visit: Payer: Self-pay

## 2023-05-28 VITALS — BP 142/88 | HR 94 | Temp 98.2°F | Ht 72.0 in | Wt 316.5 lb

## 2023-05-28 DIAGNOSIS — K429 Umbilical hernia without obstruction or gangrene: Secondary | ICD-10-CM | POA: Diagnosis not present

## 2023-05-28 DIAGNOSIS — K5903 Drug induced constipation: Secondary | ICD-10-CM

## 2023-05-28 DIAGNOSIS — R3989 Other symptoms and signs involving the genitourinary system: Secondary | ICD-10-CM | POA: Diagnosis not present

## 2023-05-28 DIAGNOSIS — F79 Unspecified intellectual disabilities: Secondary | ICD-10-CM | POA: Diagnosis not present

## 2023-05-28 DIAGNOSIS — T402X5A Adverse effect of other opioids, initial encounter: Secondary | ICD-10-CM

## 2023-05-28 DIAGNOSIS — R195 Other fecal abnormalities: Secondary | ICD-10-CM

## 2023-05-28 LAB — POCT URINALYSIS DIPSTICK
Blood, UA: NEGATIVE
Glucose, UA: NEGATIVE
Leukocytes, UA: NEGATIVE
Nitrite, UA: NEGATIVE
Protein, UA: POSITIVE — AB
Spec Grav, UA: 1.025 (ref 1.010–1.025)
Urobilinogen, UA: 1 U/dL
pH, UA: 5.5 (ref 5.0–8.0)

## 2023-05-28 MED ORDER — TAMSULOSIN HCL 0.4 MG PO CAPS
0.4000 mg | ORAL_CAPSULE | Freq: Every day | ORAL | 1 refills | Status: DC
  Filled 2023-05-28: qty 30, 30d supply, fill #0

## 2023-05-28 NOTE — Patient Instructions (Addendum)
 Thank you, [name] for allowing us  to provide your care today. Today we discussed your urinary problems, constipation, gastroenterology, and general surgery referrals.   I have ordered the following labs for you:   Lab Orders         POCT Urinalysis Dipstick (81002)      Tests ordered today:  None  Referrals ordered today:   Referral Orders  No referral(s) requested today     I have ordered the following medication/changed the following medications:   Stop the following medications: Medications Discontinued During This Encounter  Medication Reason   traZODone (DESYREL) 50 MG tablet      Start the following medications: No orders of the defined types were placed in this encounter.    Follow up: 1 month for urinary problems/tamsulosin, constipation, Gastroenterology and General surgery referrals   Remember:   - Please start taking Flomax/Tamsulosin 1 tablet daily. Please continue to limit your use of Benadryl as this can make urinary retention worse. Common side effects include painful ongoing erection (priapism), sleepiness, low blood pressure, diarrhea, runny or stuffy nose, or chills. We will follow up with you in 1 month to help determine if this medication is helpful.   - For your constipation, please continue to take Linzess daily.  Constipation can also worsen your urinary symptoms.   - Please call [facility] to make an appointment for a colonoscopy referral.  Located in: [facility] [address] Address: [address], [place], [place] [address] Phone: [phone]  - They will call you to schedule an appointment with general surgery for further evaluation of your umbilical hernia. IF YOU experience vomiting, can't keep food down, intense abdominal pain, or hardening of your abdomen please call 911 or go to the emergency room as this may be due to a medical emergency related to your hernia.    Should you have any questions or  concerns please call the internal medicine clinic at [phone].     Victor Murray Arellano Zameza, MD PGY-1 Internal Medicine Teaching [facility]

## 2023-05-28 NOTE — Progress Notes (Signed)
 Acute Office Visit  Subjective:     Patient ID: Victor Gomez, male    DOB: [birth year], 48 y.o.   MRN: [id number]  Chief Complaint  Patient presents with   Urinary Retention    "Difficulty emptying bladder"    Patient is a 48 y.o. with a past medical history stated below who presents today for constipation and urinary problems. He is accompanied by his mother. Please see problem based assessment and plan for additional details.     Review of Systems  Gastrointestinal:  Positive for constipation. Negative for abdominal pain, nausea and vomiting.  Genitourinary:  Positive for urgency. Negative for flank pain and hematuria.      Objective:    BP (!) 142/88 (BP Location: Left Arm, Patient Position: Sitting, Cuff Size: Large)   Pulse 94   Temp 98.2 F (36.8 C) (Oral)   Ht 6' (1.829 m)   Wt (!) 316 lb 8 oz (143.6 kg)   SpO2 95%   BMI 42.93 kg/m  BP Readings from Last 3 Encounters:  05/28/23 (!) 142/88  04/06/23 133/86  03/29/23 126/80   Wt Readings from Last 3 Encounters:  05/28/23 (!) 316 lb 8 oz (143.6 kg)  04/06/23 (!) 310 lb 14.4 oz (141 kg)  03/29/23 (!) 313 lb 9.6 oz (142.2 kg)   Physical Exam HENT:     Head: Normocephalic and atraumatic.  Cardiovascular:     Rate and Rhythm: Normal rate and regular rhythm.     Heart sounds: Normal heart sounds.  Pulmonary:     Effort: Pulmonary effort is normal.     Breath sounds: Normal breath sounds.  Abdominal:     General: Bowel sounds are normal.     Tenderness: There is no abdominal tenderness.     Hernia: A hernia is present.     Comments: Umbilical hernia, reducible  Skin:    General: Skin is warm.  Neurological:     Mental Status: He is alert. Mental status is at baseline.  Psychiatric:        Mood and Affect: Mood normal.    Results for orders placed or performed in visit on 05/28/23  POCT Urinalysis Dipstick (81002)  Result Value Ref Range   Color, UA Amber    Clarity, UA Slightly Cloudy    Glucose,  UA Negative Negative   Bilirubin, UA Small*    Ketones, UA Trace*    Spec Grav, UA 1.025 1.010 - 1.025   Blood, UA Negative    pH, UA 5.5 5.0 - 8.0   Protein, UA Positive (A) Negative   Urobilinogen, UA 1.0 0.2 or 1.0 E.U./dL   Nitrite, UA Negative    Leukocytes, UA Negative Negative       Assessment & Plan:   Problem List Items Addressed This Visit     Therapeutic opioid-induced constipation (OIC) (Chronic)   Patient continues to take methadone 160 mg daily, managed by Victor Gomez. Was previously prescribed Linzess to help with therapeutic opioid-induced constipation. Was having daily BM on Linzess. Noticed he did not have a bowel movement for about a week, decided to call clinic for appointment. Patient realized he had been missing doses of his Linzess and began taking more consistently again, had BM today that was mostly soft, normal color for him. Bloating has already improved. Discussed importance of taking Linzess consistently, with patient stating he will make sure he is taking all medications his mother helps to set up for him in a pill organizer.  Abdomen was soft, non-tender, and non-distended on physical exam today. Will continue to monitor.  Plan - Continue Linzess 145 mcg daily  - Patient to discuss constipation with GI when appointment is made      Urinary problem - Primary   Patient with history of urinary retention. Symptoms had previously improved after patient reduced his intake of benadryl/allergy medication. With allergies acting up recently he has gone back to using benadryl just not every and not as much as before. States he was having difficulties fully emptying his bladder, especially when he felt very constipated. Discussed the connection between constipation and its effects on the bladder. UA today with trace protein and ketones, otherwise normal. Also discussed starting tamsulosin, which had been previously discussed. Patient and his mother open to adding an  additional medication to his regimen. Will try and come back in one month to help determine if helpful with symptoms.  Plan - Start tamsulosin 0.4 mg daily; reviewed common side effects including priapism - Repeat UA at 1 month follow up  - Consider doing US  bladder scan to help determine if patient is still experiencing urinary retention at follow up appointment  -       Relevant Orders   POCT Urinalysis Dipstick (46962) (Completed)   Intellectual disability   Patient benefits from having his mother present at clinic visits. Mostly independent with activities of daily living. Patient's mother helps patient to manage his medications.       Umbilical hernia   Patient with history of prior hernia repair. Based on the description provided by the patient and his mother, it appears to have been an inguinal hernia. Patient noticed a worsening of a mass in his umbilicus in the past few months. Not painful, able to push mass back easily. Discussed warning signs of a more serious problem, such as obstruction or strangulation, and reasons to go to the ED. Patient and his mother acknowledged understanding. Agreeable to surgery referral for further evaluation.  Plan - General surgery referral placed - Follow up in 1 month       Relevant Orders   Ambulatory referral to General Surgery   Fecal occult blood test positive   FOBT positive 04/06/23. Patient was previously referred to gastroenterology for colonoscopy and further evaluation based on intermittent episodes of blood in the toilet. Per patient, does have a history of hemorrhoids. Denied active bleeding today as well as lightheadedness or weakness. Patient's mother states someone may have called to scheduled his GI appointment but she missed the call. Provided patient and his mother with [facility] clinic number to schedule his appointment as soon as possible.  Plan - Patient's mother to call [facility] to follow up on GI evaluation and colonoscopy        Meds ordered this encounter  Medications   tamsulosin (FLOMAX) 0.4 MG CAPS capsule    Sig: Take 1 capsule (0.4 mg total) by mouth daily.    Dispense:  30 capsule    Refill:  1   Patient discussed with Victor Gomez. Return in about 4 weeks (around 06/25/2023) for urinary problems/tamsulosin, constipation, Gastroenterology and General surgery referrals.  Jeree Delcid Arellano Zameza, MD

## 2023-05-28 NOTE — Assessment & Plan Note (Signed)
 Patient benefits from having his mother present at clinic visits. Mostly independent with activities of daily living. Patient's mother helps patient to manage his medications.

## 2023-05-28 NOTE — Assessment & Plan Note (Signed)
 Patient continues to take methadone 160 mg daily, managed by Jed Miners. Was previously prescribed Linzess to help with therapeutic opioid-induced constipation. Was having daily BM on Linzess. Noticed he did not have a bowel movement for about a week, decided to call clinic for appointment. Patient realized he had been missing doses of his Linzess and began taking more consistently again, had BM today that was mostly soft, normal color for him. Bloating has already improved. Discussed importance of taking Linzess consistently, with patient stating he will make sure he is taking all medications his mother helps to set up for him in a pill organizer. Abdomen was soft, non-tender, and non-distended on physical exam today. Will continue to monitor.  Plan - Continue Linzess 145 mcg daily  - Patient to discuss constipation with GI when appointment is made

## 2023-05-28 NOTE — Progress Notes (Signed)
 Internal Medicine Clinic Attending  Case discussed with the resident at the time of the visit.  We reviewed the resident's history and exam and pertinent patient test results.  I agree with the assessment, diagnosis, and plan of care documented in the resident's note.

## 2023-05-28 NOTE — Assessment & Plan Note (Signed)
 FOBT positive 04/06/23. Patient was previously referred to gastroenterology for colonoscopy and further evaluation based on intermittent episodes of blood in the toilet. Per patient, does have a history of hemorrhoids. Denied active bleeding today as well as lightheadedness or weakness. Patient's mother states someone may have called to scheduled his GI appointment but she missed the call. Provided patient and his mother with [facility] clinic number to schedule his appointment as soon as possible.  Plan - Patient's mother to call  GI to follow up on GI evaluation and colonoscopy

## 2023-05-28 NOTE — Assessment & Plan Note (Signed)
 Patient with history of prior hernia repair. Based on the description provided by the patient and his mother, it appears to have been an inguinal hernia. Patient noticed a worsening of a mass in his umbilicus in the past few months. Not painful, able to push mass back easily. Discussed warning signs of a more serious problem, such as obstruction or strangulation, and reasons to go to the ED. Patient and his mother acknowledged understanding. Agreeable to surgery referral for further evaluation.  Plan - General surgery referral placed - Follow up in 1 month

## 2023-05-28 NOTE — Telephone Encounter (Signed)
 Copied from CRM [id number]. Topic: Clinical - Red Word Triage >> May 28, 2023  9:17 AM Jayson Michael wrote: [facility] that prompted transfer to Nurse Triage: Pt's mother, Victor Gomez, called on behalf of the patient. She reports that the patient has had orange urine and difficulty urinating, ongoing intermittently for the past few weeks. Patient was feeling very unwell on Friday. Additionally, the patient had not had a bowel movement for approximately a week until this morning, after which he reported feeling somewhat better. Pt's mother is requesting to schedule an acute visit.  Chief Complaint: difficulty urinating Symptoms: see above Frequency: constant Pertinent Negatives: Patient denies pain, blood in urine,  Disposition: [] ED /[] Urgent Care (no appt availability in office) / [x] Appointment(In office/virtual)/ []  [facility]/ [] Home Care/ [] Refused Recommended Disposition /[] [facility]/ []  Follow-up with PCP Additional Notes: per protocol apt made for today; care advice given, denies questions; instructed to go to ER if becomes worse.   Reason for Disposition  Urinating more frequently than usual (i.e., frequency)  Answer Assessment - Initial Assessment Questions 1. SYMPTOM: "What's the main symptom you're concerned about?" (e.g., frequency, incontinence)     Difficulty urinating; orange  2. ONSET: "When did the  difficulty  start?"     On and off for past month 3. PAIN: "Is there any pain?" If Yes, ask: "How bad is it?" (Scale: 1-10; mild, moderate, severe)     denies 4. CAUSE: "What do you think is causing the symptoms?"     Thinks he has a UTI 5. OTHER SYMPTOMS: "Do you have any other symptoms?" (e.g., blood in urine, fever, flank pain, pain with urination)     Bad odor 6. PREGNANCY: "Is there any chance you are pregnant?" "When was your last menstrual period?"     na  Protocols used: Urinary Symptoms-A-AH

## 2023-05-28 NOTE — Assessment & Plan Note (Signed)
 Patient with history of urinary retention. Symptoms had previously improved after patient reduced his intake of benadryl/allergy medication. With allergies acting up recently he has gone back to using benadryl just not every and not as much as before. States he was having difficulties fully emptying his bladder, especially when he felt very constipated. Discussed the connection between constipation and its effects on the bladder. UA today with trace protein and ketones, otherwise normal. Also discussed starting tamsulosin, which had been previously discussed. Patient and his mother open to adding an additional medication to his regimen. Will try and come back in one month to help determine if helpful with symptoms.  Plan - Start tamsulosin 0.4 mg daily; reviewed common side effects including priapism - Repeat UA at 1 month follow up  - Consider doing US  bladder scan to help determine if patient is still experiencing urinary retention at follow up appointment  -

## 2023-05-30 ENCOUNTER — Other Ambulatory Visit: Payer: Self-pay | Admitting: Internal Medicine

## 2023-05-30 ENCOUNTER — Other Ambulatory Visit (HOSPITAL_COMMUNITY): Payer: Self-pay

## 2023-05-30 DIAGNOSIS — T402X5A Adverse effect of other opioids, initial encounter: Secondary | ICD-10-CM

## 2023-05-30 MED ORDER — LINACLOTIDE 145 MCG PO CAPS
145.0000 ug | ORAL_CAPSULE | Freq: Every day | ORAL | 3 refills | Status: DC
  Filled 2023-05-30: qty 30, 30d supply, fill #0
  Filled 2023-06-20 – 2023-06-27 (×3): qty 30, 30d supply, fill #1
  Filled 2023-07-30: qty 30, 30d supply, fill #2
  Filled 2023-08-22: qty 30, 30d supply, fill #3

## 2023-05-31 ENCOUNTER — Other Ambulatory Visit (HOSPITAL_COMMUNITY): Payer: Self-pay

## 2023-06-05 ENCOUNTER — Ambulatory Visit: Payer: Self-pay

## 2023-06-05 ENCOUNTER — Other Ambulatory Visit (HOSPITAL_COMMUNITY): Payer: Self-pay

## 2023-06-05 NOTE — Telephone Encounter (Signed)
 Patient's mother called in stating that patient was seen by his psychiatrist recently and psychiatrist suggested follow up with PCP concerning patient's weight gain. Psychiatry suggests patient has evaluation to discuss weight loss medication or appetite suppressant. Patient's mother states that patient has a heavy appetite for ice cream, specifically, and eats nearly a gallon a day. Patient's mother educated to try low calorie or low carb options. Patient's mother would like to wait until his upcoming follow up appt to triage and discuss. Advised to call back if there are any further needs.  Reason for Disposition  Caller requesting an appointment, triage offered and declined  Answer Assessment - Initial Assessment Questions 1. REASON FOR CALL or QUESTION: "What is your reason for calling today?" or "How can I best help you?" or "What question do you have that I can help answer?"     Please see notes 2. CALLER: Document the source of call. (e.g., laboratory, patient).     Patient's mother  Protocols used: PCP Call - No Triage-A-AH

## 2023-06-06 NOTE — Telephone Encounter (Signed)
 Please address.

## 2023-06-06 NOTE — Progress Notes (Signed)
 [facility] Victor Gomez 48 year old male 05-Apr-1975   HOME SLEEP TEST REPORT ( by Watch PAT)   STUDY DATE:  05-15-2023/ 06-06-2023     ORDERING CLINICIAN:  Neomia Banner, MD  REFERRING CLINICIAN: Sheree Dieter, MD    CLINICAL INFORMATION/HISTORY:  03-29-2023, Victor Gomez is a 48 y.o. male patient who is seen upon referral on 03/29/2023 from [facility] Internal medicine clinic for a sleep consult,. He is unsure why he is here. He is accompanied by his mother who redirects the conversation-I have the pleasure of seeing Victor Gomez 03/29/23 a right-handed male  patient  who seems fatigued, is obese and smokes, and has a HX of opiate addiction/ hx of methadone  for the last 4 years,  Linzess  for opiate constipation, with a possible sleep disorder. Nocturia,  caffeine abuse  daily naps , never feeling refreshed and alert.   Learning disability in school, psychomotor slowing.   ADD/   no seizures. Reading level 2nd grade.  No previous sleep testing.   Epworth sleepiness score:10- 15 / 24 points  FSS endorsed at 53/ 63 points.    BMI: 42.5  kg/m   Neck Circumference: 18"   FINDINGS:   Sleep Summary:    Total Recording Time (hours, min): 9 hours 49 minutes       Total Sleep Time (hours, min):    Only 3 hours 11 minutes                                                 Respiratory Indices:   Calculated pAHI (per AASM or CMS guideline): Following CMS guidelines this patient's AHI was 6.6/h                        Snoring: Activation of the chest wall electrode vibration sensor placed this patient's snoring into the mild category.                                              Oxygen Saturation Statistics:   Oxygen Saturation (%) Mean:     93.3%         O2 Saturation Range (%): Varied between a nadir at 77.4% with a maximum saturation of 99%.                                     O2 Saturation (minutes) <89%: 2 minutes only          Pulse Rate  Statistics:   Pulse Mean (bpm): Mean heart rate during sleep was 83 bpm between a minimum of 55 and a maximum of 95 bpm in normal sinus rhythm.                             IMPRESSION:  This HST confirms the presence of very mild sleep apnea that I could not differentiate between central or obstructive apnea.  Sleep efficiency was poor, but there was no evidence of sleep disordered breathing that would warrant  intervention.     RECOMMENDATION: No sleep disordered breathing was identified.  No abnormality in heart rate was noted.  No significant sleep hypoxia was evident.  There were only 21 total respiratory events recorded.  A follow-up in the sleep clinic is not needed.   Evident is a lack of sleep routines, adherence to bedtime and raise times, the circadian rhythm is severely disturbed and most likely due to polysubstance abuse.  I can assure his referring physicians that of an organic sleep disorder seems not to be present and will refer further care back to his primary care.   NTERPRETING PHYSICIAN:   Neomia Banner, MD  [facility] and [facility] Sleep Board certified by The [employer] of Sleep Medicine and Diplomate of the [employer] of Sleep Medicine. Board certified In Neurology through the ABPN, Fellow of the [employer] of Neu[employer].

## 2023-06-19 NOTE — Procedures (Signed)
 [facility] Victor Gomez 48 year old male 01-02-1976   HOME SLEEP TEST REPORT ( by Watch PAT)   STUDY DATE:  05-15-2023/ 06-06-2023     ORDERING CLINICIAN:  Neomia Banner, MD  REFERRING CLINICIAN: Sheree Dieter, MD    CLINICAL INFORMATION/HISTORY:  03-29-2023, Victor Gomez is a 48 y.o. male patient who is seen upon referral on 03/29/2023 from [facility] Internal medicine clinic for a sleep consult,. He is unsure why he is here. He is accompanied by his mother who redirects the conversation-I have the pleasure of seeing Victor Gomez 03/29/23 a right-handed male  patient  who seems fatigued, is obese and smokes, and has a HX of opiate addiction/ hx of methadone  for the last 4 years,  Linzess  for opiate constipation, with a possible sleep disorder. Nocturia,  caffeine abuse  daily naps , never feeling refreshed and alert.   Learning disability in school, psychomotor slowing.   ADD/   no seizures. Reading level 2nd grade.  No previous sleep testing.   Epworth sleepiness score:10- 15 / 24 points  FSS endorsed at 53/ 63 points.    BMI: 42.5  kg/m   Neck Circumference: 18"   FINDINGS:   Sleep Summary:    Total Recording Time (hours, min): 9 hours 49 minutes       Total Sleep Time (hours, min):    Only 3 hours 11 minutes                                                 Respiratory Indices:   Calculated pAHI (per AASM or CMS guideline): Following CMS guidelines this patient's AHI was 6.6/h                        Snoring: Activation of the chest wall electrode vibration sensor placed this patient's snoring into the mild category.                                              Oxygen Saturation Statistics:   Oxygen Saturation (%) Mean:     93.3%         O2 Saturation Range (%): Varied between a nadir at 77.4% with a maximum saturation of 99%.                                     O2 Saturation (minutes) <89%: 2 minutes only          Pulse Rate  Statistics:   Pulse Mean (bpm): Mean heart rate during sleep was 83 bpm between a minimum of 55 and a maximum of 95 bpm in normal sinus rhythm.                             IMPRESSION:  This HST confirms the presence of very mild sleep apnea that I could not differentiate between central or obstructive apnea.  Sleep efficiency was poor, but there was no evidence of sleep disordered breathing that would warrant intervention.  RECOMMENDATION: No sleep disordered breathing was identified.  No abnormality in heart rate was noted.  No significant sleep hypoxia was evident.  There were only 21 total respiratory events recorded.  A follow-up in the sleep clinic is not needed.   Evident is a lack of sleep routines, adherence to bedtime and raise times, the circadian rhythm is severely disturbed and most likely due to polysubstance abuse.  I can assure his referring physicians that of an organic sleep disorder seems not to be present and will refer further care back to his primary care.   NTERPRETING PHYSICIAN:   Neomia Banner, MD  [facility] Sleep Board certified by The [employer] of Sleep Medicine and Diplomate of the [employer] Sleep Medicine. Board certified In Neurology through the ABPN, Fellow of the [employer] of Neu[employer].

## 2023-06-20 ENCOUNTER — Other Ambulatory Visit (HOSPITAL_COMMUNITY): Payer: Self-pay

## 2023-06-20 ENCOUNTER — Telehealth: Payer: Self-pay

## 2023-06-20 NOTE — Telephone Encounter (Signed)
-----   Message from Burbank Dohmeier sent at 06/19/2023  8:48 PM EDT ----- No evidence of sleep disordered breathing causing arousals ,  mild  apnea and snoring only, no hypoxia.  The level of daytime sleepiness can be explained by the lack of sleep at night. Insomnia is not an organic sleep disorder.  No follow up with [facility] needed as there is not enough apnea present to justify intervention.  Patient may benefit from cognitive behavior therapy , sleep routines and limit setting for daytime naps.  Weight loss can help reduce snoring.

## 2023-06-20 NOTE — Telephone Encounter (Signed)
 Contacted pt, spoke to mother per DPR, informed her No evidence of sleep disordered breathing causing arousals ,  mild  apnea and snoring only, no hypoxia.  The level of daytime sleepiness can be explained by the lack of sleep at night. Insomnia is not an organic sleep disorder. No follow up with [facility] needed as there is not enough apnea present to justify intervention. Patient may benefit from cognitive behavior therapy , sleep routines and limit setting for daytime naps. Weight loss can help reduce snoring, per MD. advised to call the office back with any questions or concerns as she had none at this time.  She verbally understood and was appreciative.

## 2023-06-22 ENCOUNTER — Other Ambulatory Visit (HOSPITAL_COMMUNITY): Payer: Self-pay

## 2023-06-25 ENCOUNTER — Ambulatory Visit (AMBULATORY_SURGERY_CENTER): Payer: MEDICAID

## 2023-06-25 ENCOUNTER — Other Ambulatory Visit (HOSPITAL_COMMUNITY): Payer: Self-pay

## 2023-06-25 VITALS — Ht 72.0 in | Wt 320.0 lb

## 2023-06-25 DIAGNOSIS — R195 Other fecal abnormalities: Secondary | ICD-10-CM

## 2023-06-25 MED ORDER — PEG 3350-KCL-NA BICARB-NACL 420 G PO SOLR
4000.0000 mL | Freq: Once | ORAL | 0 refills | Status: AC
  Filled 2023-06-25: qty 4000, 1d supply, fill #0

## 2023-06-25 NOTE — Progress Notes (Signed)
 Pt's name and DOB verified at the beginning of the pre-visit wit 2 identifiers  Permission given to speak with Mother on phone with  Pt denies any difficulty with ambulating,sitting, laying down or rolling side to side  Pt has no issues moving head neck or swallowing  No egg or soy allergy known to patient   No issues known to pt with past sedation with any surgeries or procedures  No FH of Malignant Hyperthermia  Pt is not on home 02   Pt is not on blood thinners   Pt has frequent issues with constipation RN instructed pt to use Miralax per bottles instructions a week before prep days. Pt states they will  Pt is not on dialysis  Pt denise any abnormal heart rhythms   Pt denies any upcoming cardiac testing  Patient's chart reviewed by Rogena Class CNRA prior to pre-visit and patient appropriate for the [facility].  Pre-visit completed and red dot placed by patient's name on their procedure day (on provider's schedule).    Visit by phone  Pt states weight is 320 lb  IInstructions reviewed. Pt given  both [facility] main # and MD on call # prior to instructions.  Pt states understanding of instructions. Instructed pt to review instructions again prior to procedure and call main # given if has questions.. Pt states they will.   Instructed pt on where to find instructions on My Chart.

## 2023-06-26 ENCOUNTER — Other Ambulatory Visit (HOSPITAL_COMMUNITY): Payer: Self-pay

## 2023-06-28 ENCOUNTER — Other Ambulatory Visit (HOSPITAL_COMMUNITY): Payer: Self-pay

## 2023-07-10 NOTE — Progress Notes (Unsigned)
 Procedure canceled due to patient having inadequate bowel prep.  He suffers from constipation likely related to methadone  use.  Advised a 2-day bowel prep and use of laxatives in the form of MiraLAX and/or Dulcolax/senna.

## 2023-07-12 ENCOUNTER — Other Ambulatory Visit: Payer: Self-pay

## 2023-07-12 ENCOUNTER — Ambulatory Visit: Payer: MEDICAID | Admitting: Pediatrics

## 2023-07-12 ENCOUNTER — Other Ambulatory Visit (HOSPITAL_COMMUNITY): Payer: Self-pay

## 2023-07-12 MED ORDER — NA SULFATE-K SULFATE-MG SULF 17.5-3.13-1.6 GM/177ML PO SOLN
1.0000 | Freq: Once | ORAL | 0 refills | Status: AC
  Filled 2023-07-12: qty 354, 1d supply, fill #0

## 2023-07-12 MED ORDER — ONDANSETRON HCL 4 MG PO TABS
4.0000 mg | ORAL_TABLET | Freq: Three times a day (TID) | ORAL | 0 refills | Status: DC | PRN
  Filled 2023-07-12: qty 4, 2d supply, fill #0

## 2023-07-12 NOTE — Progress Notes (Signed)
 Pt arrived for his procedure and reports that he only had 2-3 Bms with his Golytely prep. He also threw up this morning right after the second dose. He struggles with chronic constipation and  has taken Linzess  in the past. Spoke with Dr. Mcgreal. Will reschedule for next available appt with 2 day prep and Miralax. Also a Rx for Zofran  given to be taken with prep.

## 2023-07-20 ENCOUNTER — Other Ambulatory Visit: Payer: Self-pay

## 2023-07-20 ENCOUNTER — Other Ambulatory Visit: Payer: Self-pay | Admitting: Student

## 2023-07-23 ENCOUNTER — Ambulatory Visit: Payer: MEDICAID | Admitting: Student

## 2023-07-23 VITALS — BP 130/79 | HR 96 | Temp 98.2°F | Ht 72.0 in | Wt 326.0 lb

## 2023-07-23 DIAGNOSIS — R3989 Other symptoms and signs involving the genitourinary system: Secondary | ICD-10-CM

## 2023-07-23 DIAGNOSIS — T402X5A Adverse effect of other opioids, initial encounter: Secondary | ICD-10-CM

## 2023-07-23 DIAGNOSIS — E782 Mixed hyperlipidemia: Secondary | ICD-10-CM

## 2023-07-23 DIAGNOSIS — K5903 Drug induced constipation: Secondary | ICD-10-CM

## 2023-07-23 DIAGNOSIS — K429 Umbilical hernia without obstruction or gangrene: Secondary | ICD-10-CM | POA: Diagnosis not present

## 2023-07-23 DIAGNOSIS — E785 Hyperlipidemia, unspecified: Secondary | ICD-10-CM

## 2023-07-23 LAB — POCT URINALYSIS DIPSTICK
Blood, UA: NEGATIVE
Glucose, UA: NEGATIVE
Leukocytes, UA: NEGATIVE
Nitrite, UA: NEGATIVE
Protein, UA: POSITIVE — AB
Spec Grav, UA: >=1.03 — AB (ref 1.010–1.025)
Urobilinogen, UA: 0.2 U/dL
pH, UA: 5.5 (ref 5.0–8.0)

## 2023-07-23 NOTE — Patient Instructions (Signed)
 It was a pleasure taking care of you today!    For your constipation, continue to take Linzess .  2.  Please follow-up with GI for your colonoscopy tomorrow.  3.  Your urine still has some proteins.  I will ask the lab to quantify the amount of protein.  I have ordered the following labs for you:   Lab Orders         POCT Urinalysis Dipstick ([id number])       Follow up: 3 months   Should you have any questions or concerns please call the internal medicine clinic at [phone].     Marni Sins, MD  [facility]

## 2023-07-23 NOTE — Progress Notes (Unsigned)
 [facility] History and Physical   Primary Care Physician:  Sheree Dieter, MD   Reason for Procedure:  Positive FIT test, constipation  Plan:    Colonoscopy     HPI: Victor Gomez is a 48 y.o. male undergoing colonoscopy for investigation of a positive FIT test as well as constipation.  Patient was previously scheduled for colonoscopy last month but had an adequate bowel prep and was advised to complete a 2-day bowel prep.  Chart indicates that there is a family history of colon polyps in the patient's mother.  No rectal bleeding.   Past Medical History:  Diagnosis Date   ADHD    Anxiety    Cancer (HCC)    Skin   Elevated transaminase level 03/13/2013   Hepatitis C    Hyperlipidemia    Kidney stone    MDD (major depressive disorder)    Methadone  use    clinic each day   Psychoactive substance-induced psychosis (HCC)    Substance abuse (HCC)    Substance induced mood disorder (HCC)    Tibia/fibula fracture, shaft, unspecified laterality, sequela 05/31/2017    Past Surgical History:  Procedure Laterality Date   HERNIA REPAIR      Prior to Admission medications   Medication Sig Start Date End Date Taking? Authorizing Provider  buPROPion  (WELLBUTRIN  XL) 300 MG 24 hr tablet Take 1 tablet (300 mg total) by mouth every morning. 03/08/23 03/07/24  Arellano Zameza, Priscila, MD  busPIRone  (BUSPAR ) 15 MG tablet Take 1 tablet (15 mg total) by mouth 3 (three) times daily. 11/03/22   Jackolyn Masker, MD  diphenhydrAMINE  (BENADRYL ) 50 MG capsule Take 50 mg by mouth every 6 (six) hours as needed.    [provider]  gabapentin  (NEURONTIN ) 300 MG capsule Take 1 capsule (300 mg total) by mouth 3 (three) times daily. 10/11/22     linaclotide  (LINZESS ) 145 MCG CAPS capsule Take 1 capsule (145 mcg total) by mouth daily before breakfast. 05/30/23   Sheree Dieter, MD  METHADONE  HCL PO Take by mouth. 160    [provider]  Multiple Vitamins-Minerals  (MULTIVITAMIN ADULTS PO) Take by mouth.    [provider]  naproxen  (NAPROSYN ) 500 MG tablet Take 1 tablet (500 mg total) by mouth as needed. 08/22/22   Masters, Katie, DO  nicotine  (NICODERM CQ  - DOSED IN MG/24 HOURS) 21 mg/24hr patch Place 1 patch (21 mg total) onto the skin daily. Patient not taking: Reported on 06/25/2023 03/08/23 03/07/24  Arellano Zameza, Priscila, MD  nicotine  polacrilex (NICORETTE ) 4 MG gum Take 1 each (4 mg total) by mouth as needed for smoking cessation. 04/02/23   Sheree Dieter, MD  OLANZapine  (ZYPREXA ) 15 MG tablet Take 1 tablet (15 mg total) by mouth 2 (two) times daily. 10/11/22     ondansetron  (ZOFRAN ) 4 MG tablet Take 1 tablet (4 mg total) by mouth every 8 (eight) hours as needed for nausea or vomiting. Take 1 tablet (4mg  total) 30 minutes prior to each Suprep dose. 07/12/23   Truddie Furrow, MD  rosuvastatin  (CRESTOR ) 20 MG tablet Take 1 tablet (20 mg total) by mouth daily. 08/23/22 08/23/23  Masters, Katie, DO  tamsulosin  (FLOMAX ) 0.4 MG CAPS capsule Take 1 capsule (0.4 mg total) by mouth daily. Patient not taking: Reported on 06/25/2023 05/28/23   Arellano Zameza, Priscila, MD  traZODone  (DESYREL ) 100 MG tablet Take 100 mg by mouth.    [provider]  TYLENOL  500 MG tablet Take 500-1,000 mg by mouth every  6 (six) hours as needed for mild pain or headache.    [provider]  ARIPiprazole  (ABILIFY ) 10 MG tablet Take 1 tablet (10 mg total) by mouth at bedtime. Patient not taking: Reported on 07/15/2022 05/01/22 08/09/22    atorvastatin  (LIPITOR) 40 MG tablet Take 1 tablet (40 mg total) by mouth daily. 03/10/21 11/10/21  Barnetta Liberty, MD    Current Outpatient Medications  Medication Sig Dispense Refill   buPROPion  (WELLBUTRIN  XL) 300 MG 24 hr tablet Take 1 tablet (300 mg total) by mouth every morning. 30 tablet 2   busPIRone  (BUSPAR ) 15 MG tablet Take 1 tablet (15 mg total) by mouth 3 (three) times daily. 90 tablet 1   diphenhydrAMINE   (BENADRYL ) 50 MG capsule Take 50 mg by mouth every 6 (six) hours as needed.     gabapentin  (NEURONTIN ) 300 MG capsule Take 1 capsule (300 mg total) by mouth 3 (three) times daily. 90 capsule 1   linaclotide  (LINZESS ) 145 MCG CAPS capsule Take 1 capsule (145 mcg total) by mouth daily before breakfast. 30 capsule 3   METHADONE  HCL PO Take by mouth. 160     naproxen  (NAPROSYN ) 500 MG tablet Take 1 tablet (500 mg total) by mouth as needed. 60 tablet 2   nicotine  polacrilex (NICORETTE ) 4 MG gum Take 1 each (4 mg total) by mouth as needed for smoking cessation. 100 tablet 0   OLANZapine  (ZYPREXA ) 15 MG tablet Take 1 tablet (15 mg total) by mouth 2 (two) times daily. 60 tablet 1   rosuvastatin  (CRESTOR ) 20 MG tablet Take 1 tablet (20 mg total) by mouth daily. 30 tablet 11   TYLENOL  500 MG tablet Take 500-1,000 mg by mouth every 6 (six) hours as needed for mild pain or headache.     Multiple Vitamins-Minerals (MULTIVITAMIN ADULTS PO) Take by mouth.     nicotine  (NICODERM CQ  - DOSED IN MG/24 HOURS) 21 mg/24hr patch Place 1 patch (21 mg total) onto the skin daily. (Patient not taking: Reported on 03/29/2023) 30 patch 1   ondansetron  (ZOFRAN ) 4 MG tablet Take 1 tablet (4 mg total) by mouth every 8 (eight) hours as needed for nausea or vomiting. Take 1 tablet (4mg  total) 30 minutes prior to each Suprep dose. (Patient not taking: Reported on 07/24/2023) 4 tablet 0   tamsulosin  (FLOMAX ) 0.4 MG CAPS capsule Take 1 capsule (0.4 mg total) by mouth daily. (Patient not taking: Reported on 07/24/2023) 30 capsule 1   traZODone  (DESYREL ) 100 MG tablet Take 100 mg by mouth. (Patient not taking: Reported on 07/24/2023)     Current Facility-Administered Medications  Medication Dose Route Frequency Provider Last Rate Last Admin   0.9 %  sodium chloride  infusion  500 mL Intravenous Once Oanh Devivo M, MD        Allergies as of 07/24/2023 - Review Complete 07/24/2023  Allergen Reaction Noted   Abilify  [aripiprazole ] Other  (See Comments) 07/15/2022   Trazodone  and nefazodone Other (See Comments) 07/15/2022    Family History  Problem Relation Age of Onset   Colon polyps Mother    Diabetes Maternal Grandfather    Colon polyps Other    Colon cancer Other    Esophageal cancer Neg Hx    Rectal cancer Neg Hx    Stomach cancer Neg Hx     Social History   Socioeconomic History   Marital status: Legally Separated    Spouse name: Not on file   Number of children: 1   Years of education: Not  on file   Highest education level: 10th grade  Occupational History   Not on file  Tobacco Use   Smoking status: Every Day    Current packs/day: 0.50    Types: Cigarettes   Smokeless tobacco: Never   Tobacco comments:    1 pack every 3 days   Vaping Use   Vaping status: Never Used  Substance and Sexual Activity   Alcohol use: Yes    Comment: occ   Drug use: Not Currently    Types: Marijuana, "Crack" cocaine, Methamphetamines, Heroin    Comment: last time he used 04/2014   Sexual activity: Not on file  Other Topics Concern   Not on file  Social History Narrative   Right handed    Drinks coffee 2 cups per day   Social Drivers of Health   Financial Resource Strain: Not on file  Food Insecurity: No Food Insecurity (03/08/2023)   Hunger Vital Sign    Worried About Running Out of Food in the Last Year: Never true    Ran Out of Food in the Last Year: Never true  Transportation Needs: No Transportation Needs (03/08/2023)   PRAPARE - Administrator, Civil Service (Medical): No    Lack of Transportation (Non-Medical): No  Physical Activity: Not on file  Stress: Not on file  Social Connections: Moderately Isolated (03/08/2023)   Social Connection and Isolation Panel [NHANES]    Frequency of Communication with Friends and Family: Three times a week    Frequency of Social Gatherings with Friends and Family: Not on file    Attends Religious Services: More than 4 times per year    Active Member of  [employer] or Organizations: No    Attends Banker Meetings: Never    Marital Status: Divorced  Catering manager Violence: Not At Risk (03/08/2023)   Humiliation, Afraid, Rape, and Kick questionnaire    Fear of Current or Ex-Partner: No    Emotionally Abused: No    Physically Abused: No    Sexually Abused: No    Review of Systems:  All other review of systems negative except as mentioned in the HPI.  Physical Exam: Vital signs BP (!) 145/104   Pulse 86   Temp 98.6 F (37 C)   Resp (!) 22   Ht 6' (1.829 m)   Wt (!) 320 lb (145.2 kg)   SpO2 (!) 87%   BMI 43.40 kg/m   General:   Alert,  Well-developed, well-nourished, pleasant and cooperative in NAD Airway:  Mallampati 3 Lungs:  Clear throughout to auscultation.   Heart:  Regular rate and rhythm; no murmurs, clicks, rubs,  or gallops. Abdomen:  Soft, nontender and nondistended. Normal bowel sounds.   Neuro/Psych:  Normal mood and affect. A and O x 3  Eugenia Hess, MD [facility]

## 2023-07-23 NOTE — Progress Notes (Signed)
 CC: Follow-up on urinary retention.  HPI:  Victor Gomez is a 48 y.o. male living with a history stated below and presents today for Follow-up on urinary retention.  Urinary retention has resolved, he denies dysuria or a suprapubic tenderness.  He is using Benadryl  intermittently for allergies.  Since last OV, he has also follow-up with general surgery for the umbilical hernia.  His last colonoscopy procedure on 5/21 was canceled due to patient having inadequate bowel prep.  This has been rescheduled to 6/10, this time he is having adequate bowel cleanout.   Past Medical History:  Diagnosis Date   ADHD    Anxiety    Cancer (HCC)    Skin   Elevated transaminase level 03/13/2013   Hepatitis C    Hyperlipidemia    Kidney stone    MDD (major depressive disorder)    Methadone  use    clinic each day   Psychoactive substance-induced psychosis (HCC)    Substance abuse (HCC)    Substance induced mood disorder (HCC)    Tibia/fibula fracture, shaft, unspecified laterality, sequela 05/31/2017    Current Outpatient Medications on File Prior to Visit  Medication Sig Dispense Refill   buPROPion  (WELLBUTRIN  XL) 300 MG 24 hr tablet Take 1 tablet (300 mg total) by mouth every morning. 30 tablet 2   busPIRone  (BUSPAR ) 15 MG tablet Take 1 tablet (15 mg total) by mouth 3 (three) times daily. 90 tablet 1   diphenhydrAMINE  (BENADRYL ) 50 MG capsule Take 50 mg by mouth every 6 (six) hours as needed.     gabapentin  (NEURONTIN ) 300 MG capsule Take 1 capsule (300 mg total) by mouth 3 (three) times daily. 90 capsule 1   linaclotide  (LINZESS ) 145 MCG CAPS capsule Take 1 capsule (145 mcg total) by mouth daily before breakfast. 30 capsule 3   METHADONE  HCL PO Take by mouth. 160     Multiple Vitamins-Minerals (MULTIVITAMIN ADULTS PO) Take by mouth.     naproxen  (NAPROSYN ) 500 MG tablet Take 1 tablet (500 mg total) by mouth as needed. 60 tablet 2   nicotine  (NICODERM CQ  - DOSED IN MG/24 HOURS) 21  mg/24hr patch Place 1 patch (21 mg total) onto the skin daily. (Patient not taking: Reported on 06/25/2023) 30 patch 1   nicotine  polacrilex (NICORETTE ) 4 MG gum Take 1 each (4 mg total) by mouth as needed for smoking cessation. 100 tablet 0   OLANZapine  (ZYPREXA ) 15 MG tablet Take 1 tablet (15 mg total) by mouth 2 (two) times daily. 60 tablet 1   ondansetron  (ZOFRAN ) 4 MG tablet Take 1 tablet (4 mg total) by mouth every 8 (eight) hours as needed for nausea or vomiting. Take 1 tablet (4mg  total) 30 minutes prior to each Suprep dose. 4 tablet 0   rosuvastatin  (CRESTOR ) 20 MG tablet Take 1 tablet (20 mg total) by mouth daily. 30 tablet 11   tamsulosin  (FLOMAX ) 0.4 MG CAPS capsule Take 1 capsule (0.4 mg total) by mouth daily. (Patient not taking: Reported on 06/25/2023) 30 capsule 1   traZODone  (DESYREL ) 100 MG tablet Take 100 mg by mouth.     TYLENOL  500 MG tablet Take 500-1,000 mg by mouth every 6 (six) hours as needed for mild pain or headache.     [DISCONTINUED] ARIPiprazole  (ABILIFY ) 10 MG tablet Take 1 tablet (10 mg total) by mouth at bedtime. (Patient not taking: Reported on 07/15/2022) 30 tablet 2   [DISCONTINUED] atorvastatin  (LIPITOR) 40 MG tablet Take 1 tablet (40 mg total) by  mouth daily. 30 tablet 11   No current facility-administered medications on file prior to visit.    Review of Systems: ROS negative except for what is noted on the assessment and plan.  Vitals:   07/23/23 0954  BP: 130/79  Pulse: 96  Temp: 98.2 F (36.8 C)  TempSrc: Oral  SpO2: 93%  Weight: (!) 326 lb (147.9 kg)  Height: 6' (1.829 m)   Physical Exam: Constitutional:NAD. Cardiovascular: regular rate and rhythm, no m/r/g Pulmonary/Chest: Clear bilateral lungs, no wheezes.   Abdominal: Visible bulge near the bellybutton, reducible, no signs of strangulation.  Assessment & Plan:   Patient discussed with Dr. Jarvis Mesa  Urinary problem Urinary retention has resolved; the patient is no longer taking Flomax .  Retention was likely medication-induced, associated with recent use of diphenhydramine  for seasonal allergies. Bladder scan today shows no post-void residual. Repeat urine dipstick reveals specific gravity of 1.030 and proteinuria. Findings are likely due to dehydration from bowel prep for scheduled colonoscopy on 6/10. -Order urine microalbumin/creatinine ratio to quantify proteinuria. - PRN Flomax  if urinary retention recurs.  Therapeutic opioid-induced constipation (OIC) Improved, had 3 bowel movement last week.  Currently having a bowel prep for colonoscopy tomorrow.   -Continue Linzess  145 mcg daily - Colonoscopy with GI on 6/10.  Umbilical hernia He followed with general surgery, no surgical indication at this time.  He is not endorsing umbilical pain, or persistent nausea or vomiting.  His abdominal exam is benign. - Will monitor, provided return precautions.  Hyperlipidemia Lipids were last checked about a year ago.  On rosuvastatin  10 mg. - Will check lipids.  Marni Sins, MD [facility] Internal Medicine, PGY-1  Date 07/23/2023 Time 5:01 PM

## 2023-07-23 NOTE — Assessment & Plan Note (Signed)
 Lipids were last checked about a year ago.  On rosuvastatin  10 mg. - Will check lipids.

## 2023-07-23 NOTE — Assessment & Plan Note (Signed)
 He followed with general surgery, no surgical indication at this time.  He is not endorsing umbilical pain, or persistent nausea or vomiting.  His abdominal exam is benign. - Will monitor, provided return precautions.

## 2023-07-23 NOTE — Assessment & Plan Note (Signed)
 Urinary retention has resolved; the patient is no longer taking Flomax . Retention was likely medication-induced, associated with recent use of diphenhydramine  for seasonal allergies. Bladder scan today shows no post-void residual. Repeat urine dipstick reveals specific gravity of 1.030 and proteinuria. Findings are likely due to dehydration from bowel prep for scheduled colonoscopy on 6/10. -Order urine microalbumin/creatinine ratio to quantify proteinuria. - PRN Flomax  if urinary retention recurs.

## 2023-07-23 NOTE — Assessment & Plan Note (Signed)
 Improved, had 3 bowel movement last week.  Currently having a bowel prep for colonoscopy tomorrow.   -Continue Linzess  145 mcg daily - Colonoscopy with GI on 6/10.

## 2023-07-24 ENCOUNTER — Ambulatory Visit: Payer: MEDICAID | Admitting: Pediatrics

## 2023-07-24 ENCOUNTER — Encounter: Payer: Self-pay | Admitting: Pediatrics

## 2023-07-24 VITALS — BP 117/64 | HR 86 | Temp 98.6°F | Resp 20 | Ht 72.0 in | Wt 320.0 lb

## 2023-07-24 DIAGNOSIS — K635 Polyp of colon: Secondary | ICD-10-CM | POA: Diagnosis not present

## 2023-07-24 DIAGNOSIS — D128 Benign neoplasm of rectum: Secondary | ICD-10-CM

## 2023-07-24 DIAGNOSIS — K621 Rectal polyp: Secondary | ICD-10-CM

## 2023-07-24 DIAGNOSIS — D124 Benign neoplasm of descending colon: Secondary | ICD-10-CM

## 2023-07-24 DIAGNOSIS — K648 Other hemorrhoids: Secondary | ICD-10-CM

## 2023-07-24 DIAGNOSIS — Z1211 Encounter for screening for malignant neoplasm of colon: Secondary | ICD-10-CM | POA: Diagnosis not present

## 2023-07-24 DIAGNOSIS — D125 Benign neoplasm of sigmoid colon: Secondary | ICD-10-CM

## 2023-07-24 DIAGNOSIS — K439 Ventral hernia without obstruction or gangrene: Secondary | ICD-10-CM

## 2023-07-24 DIAGNOSIS — R195 Other fecal abnormalities: Secondary | ICD-10-CM | POA: Diagnosis not present

## 2023-07-24 LAB — LIPID PANEL
Chol/HDL Ratio: 5.5 ratio — ABNORMAL HIGH (ref 0.0–5.0)
Cholesterol, Total: 244 mg/dL — ABNORMAL HIGH (ref 100–199)
HDL: 44 mg/dL (ref >39)
LDL Chol Calc (NIH): 116 mg/dL — ABNORMAL HIGH (ref 0–99)
Triglycerides: 478 mg/dL — ABNORMAL HIGH (ref 0–149)
VLDL Cholesterol Cal: 84 mg/dL — ABNORMAL HIGH (ref 5–40)

## 2023-07-24 LAB — MICROALBUMIN / CREATININE URINE RATIO
Creatinine, Urine: 247.1 mg/dL
Microalb/Creat Ratio: 27 mg/g{creat} (ref 0–29)
Microalbumin, Urine: 66 ug/mL

## 2023-07-24 MED ORDER — SODIUM CHLORIDE 0.9 % IV SOLN: 500.0000 mL | Freq: Once | INTRAVENOUS | Status: DC

## 2023-07-24 NOTE — Progress Notes (Signed)
 Internal Medicine Clinic Attending  Case discussed with the resident at the time of the visit.  We reviewed the resident's history and exam and pertinent patient test results.  I agree with the assessment, diagnosis, and plan of care documented in the resident's note.    I agree with checking urine MACR since patient has had proteinuria on 2 subsequent U/A's now. Results pending.

## 2023-07-24 NOTE — Progress Notes (Signed)
 To pacu, VSS. Report to [email or web address]

## 2023-07-24 NOTE — Patient Instructions (Addendum)
-  Handout on polyps and hemorrhoids provided -await pathology results -repeat colonoscopy for surveillance recommended. Date to be determined when pathology result become available    YOU HAD AN ENDOSCOPIC PROCEDURE TODAY AT THE [facility]:   Refer to the procedure report that was given to you for any specific questions about what was found during the examination.  If the procedure report does not answer your questions, please call your gastroenterologist to clarify.  If you requested that your care partner not be given the details of your procedure findings, then the procedure report has been included in a sealed envelope for you to review at your convenience later.  YOU SHOULD EXPECT: Some feelings of bloating in the abdomen. Passage of more gas than usual.  Walking can help get rid of the air that was put into your GI tract during the procedure and reduce the bloating. If you had a lower endoscopy (such as a colonoscopy or flexible sigmoidoscopy) you may notice spotting of blood in your stool or on the toilet paper. If you underwent a bowel prep for your procedure, you may not have a normal bowel movement for a few days.  Please Note:  You might notice some irritation and congestion in your nose or some drainage.  This is from the oxygen used during your procedure.  There is no need for concern and it should clear up in a day or so.  SYMPTOMS TO REPORT IMMEDIATELY:  Following lower endoscopy (colonoscopy or flexible sigmoidoscopy):  Excessive amounts of blood in the stool  Significant tenderness or worsening of abdominal pains  Swelling of the abdomen that is new, acute  Fever of 100F or higher  For urgent or emergent issues, a gastroenterologist can be reached at any hour by calling [phone]. Do not use MyChart messaging for urgent concerns.    DIET:  We do recommend a small meal at first, but then you may proceed to your regular diet.  Drink plenty of fluids but you  should avoid alcoholic beverages for 24 hours.  ACTIVITY:  You should plan to take it easy for the rest of today and you should NOT DRIVE or use heavy machinery until tomorrow (because of the sedation medicines used during the test).    FOLLOW UP: Our staff will call the number listed on your records the next business day following your procedure.  We will call around 7:15- 8:00 am to check on you and address any questions or concerns that you may have regarding the information given to you following your procedure. If we do not reach you, we will leave a message.     If any biopsies were taken you will be contacted by phone or by letter within the next 1-3 weeks.  Please call us at [phone] if you have not heard about the biopsies in 3 weeks.    SIGNATURES/CONFIDENTIALITY: You and/or your care partner have signed paperwork which will be entered into your electronic medical record.  These signatures attest to the fact that that the information above on your After Visit Summary has been reviewed and is understood.  Full responsibility of the confidentiality of this discharge information lies with you and/or your care-partner.

## 2023-07-24 NOTE — Progress Notes (Signed)
 Pt's states no medical or surgical changes since previsit or office visit.

## 2023-07-24 NOTE — Progress Notes (Signed)
 Called to room to assist during endoscopic procedure.  Patient ID and intended procedure confirmed with present staff. Received instructions for my participation in the procedure from the performing physician.

## 2023-07-24 NOTE — Op Note (Signed)
 [facility] Patient Name: Victor Gomez Procedure Date: 07/24/2023 8:52 AM MRN: [id number] Endoscopist: Eugenia Hess , MD, [id number] Age: 48 Referring MD:  Date of Birth: 1975-07-13 Gender: Male Account #: [id number] Procedure:                Colonoscopy Indications:              This is the patient's first colonoscopy, Positive                            fecal immunochemical test, Constipation, Possible                            history of colon polyps in patient's mother Medicines:                Monitored Anesthesia Care Procedure:                Pre-Anesthesia Assessment:                           - Prior to the procedure, a History and Physical                            was performed, and patient medications and                            allergies were reviewed. The patient's tolerance of                            previous anesthesia was also reviewed. The risks                            and benefits of the procedure and the sedation                            options and risks were discussed with the patient.                            All questions were answered, and informed consent                            was obtained. Prior Anticoagulants: The patient has                            taken no anticoagulant or antiplatelet agents. ASA                            Grade Assessment: III - A patient with severe                            systemic disease. After reviewing the risks and                            benefits, the patient was deemed in satisfactory  condition to undergo the procedure.                           After obtaining informed consent, the colonoscope                            was passed under direct vision. Throughout the                            procedure, the patient's blood pressure, pulse, and                            oxygen saturations were monitored continuously. The                            CF [id number]  #[id number] was introduced through the anus                            and advanced to the cecum, identified by                            appendiceal orifice and ileocecal valve. The                            colonoscopy was somewhat difficult due to ventral                            hernia. Successful completion of the procedure was                            aided by using manual pressure. The patient                            tolerated the procedure well. The quality of the                            bowel preparation was adequate. The ileocecal                            valve, appendiceal orifice, and rectum were                            photographed. Scope In: 9:05:41 AM Scope Out: 9:35:16 AM Scope Withdrawal Time: 0 hours 22 minutes 16 seconds  Total Procedure Duration: 0 hours 29 minutes 35 seconds  Findings:                 The perianal and digital rectal examinations were                            normal. Pertinent negatives include normal                            sphincter tone and no palpable rectal lesions.  Three sessile polyps were found in the rectum,                            sigmoid colon and descending colon. The polyps were                            5 to 6 mm in size. These polyps were removed with a                            cold snare. Resection and retrieval were complete.                           Four sessile polyps were found in the sigmoid                            colon. The polyps were 3 to 4 mm in size. These                            polyps were removed with a cold biopsy forceps.                            Resection and retrieval were complete.                           Internal hemorrhoids were found during retroflexion. Complications:            No immediate complications. Estimated blood loss:                            Minimal. Estimated Blood Loss:     Estimated blood loss was minimal. Impression:               -  Three 5 to 6 mm polyps in the rectum, in the                            sigmoid colon and in the descending colon, removed                            with a cold snare. Resected and retrieved.                           - Four 3 to 4 mm polyps in the sigmoid colon,                            removed with a cold biopsy forceps. Resected and                            retrieved.                           - Internal hemorrhoids. Recommendation:           - Discharge patient to home (ambulatory).                           -  Await pathology results.                           - Repeat colonoscopy for surveillance based on                            pathology results.                           - The findings and recommendations were discussed                            with the patient's family.                           - Return to referring physician.                           - Patient has a contact number available for                            emergencies. The signs and symptoms of potential                            delayed complications were discussed with the                            patient. Return to normal activities tomorrow.                            Written discharge instructions were provided to the                            patient. Eugenia Hess, MD 07/24/2023 9:42:19 AM This report has been signed electronically.

## 2023-07-25 ENCOUNTER — Telehealth: Payer: Self-pay | Admitting: *Deleted

## 2023-07-25 MED FILL — Bupropion HCl Tab ER 24HR 300 MG: ORAL | 30 days supply | Qty: 30 | Fill #0 | Status: CN

## 2023-07-25 NOTE — Telephone Encounter (Signed)
 Attempted post procedure follow up call.  No answer - LVM.

## 2023-07-26 ENCOUNTER — Other Ambulatory Visit (HOSPITAL_COMMUNITY): Payer: Self-pay

## 2023-07-26 LAB — SURGICAL PATHOLOGY

## 2023-07-28 ENCOUNTER — Ambulatory Visit: Payer: Self-pay | Admitting: Pediatrics

## 2023-07-30 ENCOUNTER — Other Ambulatory Visit: Payer: Self-pay

## 2023-07-30 ENCOUNTER — Encounter: Payer: Self-pay | Admitting: *Deleted

## 2023-08-04 ENCOUNTER — Other Ambulatory Visit (HOSPITAL_COMMUNITY): Payer: Self-pay

## 2023-08-06 ENCOUNTER — Other Ambulatory Visit: Payer: Self-pay

## 2023-09-03 ENCOUNTER — Other Ambulatory Visit (HOSPITAL_COMMUNITY): Payer: Self-pay

## 2023-09-03 ENCOUNTER — Other Ambulatory Visit: Payer: Self-pay

## 2023-09-05 ENCOUNTER — Encounter: Payer: Self-pay | Admitting: Student

## 2023-09-05 ENCOUNTER — Telehealth: Payer: Self-pay | Admitting: Student

## 2023-09-05 NOTE — Telephone Encounter (Unsigned)
 Copied from CRM #[id number]. Topic: Clinical - Medication Refill >> Sep 05, 2023  3:45 PM Miquel SAILOR wrote: Medication: rosuvastatin  (CRESTOR ) 20 MG tablet   Has the patient contacted their pharmacy? Yes (Agent: If no, request that the patient contact the pharmacy for the refill. If patient does not wish to contact the pharmacy document the reason why and proceed with request.) (Agent: If yes, when and what did the pharmacy advise?)  This is the patient's preferred pharmacy:  [place] [facility] [address] [place] [address] Phone: [phone] Fax: [phone]    Is this the correct pharmacy for this prescription? Yes If no, delete pharmacy and type the correct one.   Has the prescription been filled recently? Yes  Is the patient out of the medication? No 6pills left  Has the patient been seen for an appointment in the last year OR does the patient have an upcoming appointment? No  Can we respond through MyChart? Yes  Agent: Please be advised that Rx refills may take up to 3 business days. We ask that you follow-up with your pharmacy.

## 2023-09-05 NOTE — Telephone Encounter (Signed)
 This encounter was created in error - please disregard.

## 2023-09-05 NOTE — Telephone Encounter (Signed)
 Duplicate request:   Copied from CRM #[id number]. Topic: Clinical - Medication Refill >> Sep 05, 2023  3:45 PM Miquel SAILOR wrote: Medication: rosuvastatin  (CRESTOR ) 20 MG tablet     Has the patient contacted their pharmacy? Yes (Agent: If no, request that the patient contact the pharmacy for the refill. If patient does not wish to contact the pharmacy document the reason why and proceed with request.) (Agent: If yes, when and what did the pharmacy advise?)   This is the patient's preferred pharmacy:  [place] [facility] [address] [place] [address] Phone: [phone] Fax: [phone]       Is this the correct pharmacy for this prescription? Yes If no, delete pharmacy and type the correct one.    Has the prescription been filled recently? Yes   Is the patient out of the medication? No 6pills left   Has the patient been seen for an appointment in the last year OR does the patient have an upcoming appointment? No   Can we respond through MyChart? Yes   Agent: Please be advised that Rx refills may take up to 3 business days. We ask that you follow-up with your pharmacy.

## 2023-09-06 ENCOUNTER — Other Ambulatory Visit: Payer: Self-pay | Admitting: Student

## 2023-09-06 ENCOUNTER — Other Ambulatory Visit (HOSPITAL_COMMUNITY): Payer: Self-pay

## 2023-09-06 ENCOUNTER — Other Ambulatory Visit: Payer: Self-pay

## 2023-09-06 DIAGNOSIS — E785 Hyperlipidemia, unspecified: Secondary | ICD-10-CM

## 2023-09-06 MED ORDER — ROSUVASTATIN CALCIUM 20 MG PO TABS
20.0000 mg | ORAL_TABLET | Freq: Every day | ORAL | 3 refills | Status: AC
  Filled 2023-09-06: qty 90, 90d supply, fill #0
  Filled 2023-12-02: qty 90, 90d supply, fill #1
  Filled 2024-02-27: qty 90, 90d supply, fill #2

## 2023-09-06 NOTE — Telephone Encounter (Signed)
 Call to patient-spoke with his mother informed her that the patient's prescription has been called to the [facility].

## 2023-09-21 ENCOUNTER — Other Ambulatory Visit: Payer: Self-pay

## 2023-10-01 ENCOUNTER — Other Ambulatory Visit: Payer: Self-pay

## 2023-10-10 ENCOUNTER — Other Ambulatory Visit: Payer: Self-pay

## 2023-10-10 ENCOUNTER — Other Ambulatory Visit (HOSPITAL_COMMUNITY): Payer: Self-pay

## 2023-10-10 ENCOUNTER — Other Ambulatory Visit: Payer: Self-pay | Admitting: Student

## 2023-10-10 DIAGNOSIS — T402X5A Adverse effect of other opioids, initial encounter: Secondary | ICD-10-CM

## 2023-10-10 MED ORDER — LINACLOTIDE 145 MCG PO CAPS
145.0000 ug | ORAL_CAPSULE | Freq: Every day | ORAL | 3 refills | Status: DC
  Filled 2023-10-10: qty 30, 30d supply, fill #0
  Filled 2023-11-02: qty 30, 30d supply, fill #1

## 2023-11-07 ENCOUNTER — Ambulatory Visit: Payer: Self-pay

## 2023-11-07 NOTE — Telephone Encounter (Signed)
 RTC to patient is having the abdominal pain. No fever, nausea or vomiting.  Actually he said he feels better since having a BM.  Would still like to get an appointment to come in if possible.

## 2023-11-07 NOTE — Telephone Encounter (Signed)
 FYI Only or Action Required?: FYI only for provider.  Patient was last seen in primary care on 07/23/2023 by Celestina Czar, MD.  Called Nurse Triage reporting Abdominal Cramping.  Symptoms began several months ago.  Interventions attempted: OTC medications: Dulcolax, Prescription medications: Linzess , Rest, hydration, or home remedies, and Dietary changes.  Symptoms are: gradually worsening.  Triage Disposition: See PCP Within 2 Weeks  Patient/caregiver understands and will follow disposition?: Yes   Copied from CRM [id number]. Topic: Clinical - Red Word Triage >> Nov 07, 2023 12:01 PM Miquel SAILOR wrote: Red Word that prompted transfer to Nurse Triage: Constipation issue/did have bowl movement 09/23 and today since 5-6 month but feels worse. Feels something is not right Reason for Disposition  Abdominal pain is a chronic symptom (recurrent or ongoing AND present > 4 weeks)  Answer Assessment - Initial Assessment Questions 1. LOCATION: Where does it hurt?      BLQ 2. RADIATION: Does the pain shoot anywhere else? (e.g., chest, back)     denies 3. ONSET: When did the pain begin? (Minutes, hours or days ago)      5-6 months ago 4. SUDDEN: Gradual or sudden onset?     gradual 5. PATTERN Does the pain come and go, or is it constant?     Worse when attempting bm 6. SEVERITY: How bad is the pain?  (e.g., Scale 1-10; mild, moderate, or severe)     4-5/10 7. RECURRENT SYMPTOM: Have you ever had this type of stomach pain before? If Yes, ask: When was the last time? and What happened that time?      Yes, with colonoscopy and ployp removal. approx 4 months ago 8. CAUSE: What do you think is causing the stomach pain? (e.g., gallstones, recent abdominal surgery)     Similar to before polyp removal 9. RELIEVING/AGGRAVATING FACTORS: What makes it better or worse? (e.g., antacids, bending or twisting motion, bowel movement)     After bm 10. OTHER SYMPTOMS: Do you have any  other symptoms? (e.g., back pain, diarrhea, fever, urination pain, vomiting)       Denies  Pt has chronic abd pain and had colonoscopy and polyp removal approx 5-6 months ago, has felt no better since, admits to constipation, taking OTC dulcolax and stool softeners, admits to bm yesterday and today  Protocols used: Abdominal Pain - Male-A-AH

## 2023-11-14 ENCOUNTER — Encounter: Payer: Self-pay | Admitting: Student

## 2023-11-14 ENCOUNTER — Ambulatory Visit: Payer: MEDICAID | Admitting: Student

## 2023-11-14 VITALS — BP 122/84 | HR 92 | Ht 72.0 in | Wt 332.0 lb

## 2023-11-14 DIAGNOSIS — R7303 Prediabetes: Secondary | ICD-10-CM | POA: Diagnosis not present

## 2023-11-14 DIAGNOSIS — E66813 Obesity, class 3: Secondary | ICD-10-CM

## 2023-11-14 DIAGNOSIS — K5903 Drug induced constipation: Secondary | ICD-10-CM | POA: Diagnosis not present

## 2023-11-14 DIAGNOSIS — F17210 Nicotine dependence, cigarettes, uncomplicated: Secondary | ICD-10-CM | POA: Diagnosis not present

## 2023-11-14 DIAGNOSIS — T402X5A Adverse effect of other opioids, initial encounter: Secondary | ICD-10-CM

## 2023-11-14 DIAGNOSIS — Z6841 Body Mass Index (BMI) 40.0 and over, adult: Secondary | ICD-10-CM | POA: Diagnosis not present

## 2023-11-14 DIAGNOSIS — E661 Drug-induced obesity: Secondary | ICD-10-CM

## 2023-11-14 LAB — POCT GLYCOSYLATED HEMOGLOBIN (HGB A1C): Hemoglobin A1C: 6.1 % — AB (ref 4.0–5.6)

## 2023-11-14 MED ORDER — NALOXEGOL OXALATE 12.5 MG PO TABS
12.5000 mg | ORAL_TABLET | Freq: Every day | ORAL | 11 refills | Status: AC
  Filled 2023-11-14 – 2023-11-20 (×2): qty 30, 30d supply, fill #0
  Filled 2023-12-14: qty 30, 30d supply, fill #1
  Filled 2024-01-13: qty 30, 30d supply, fill #2
  Filled 2024-01-30 – 2024-02-12 (×2): qty 30, 30d supply, fill #3
  Filled 2024-03-10: qty 30, 30d supply, fill #4

## 2023-11-14 MED ORDER — VARENICLINE TARTRATE (STARTER) 0.5 MG X 11 & 1 MG X 42 PO TBPK
1.0000 mg | ORAL_TABLET | Freq: Two times a day (BID) | ORAL | 0 refills | Status: DC
  Filled 2023-11-14: qty 53, 26d supply, fill #0

## 2023-11-14 NOTE — Assessment & Plan Note (Signed)
 Patient has a past medical history of obesity.  His BMI is 45.  He has gained a significant amount of weight over the past year.  Patient's mother does endorse that patient does have poor eating habits.  Patient also is on olanzapine .  Likely multifactorial.  Encouraged lifestyle modifications.  A1c today 6.1.  This makes him prediabetic.  He is also diagnosed with hyperlipidemia.  He is developing components of metabolic syndrome that can put him at risk for heart attack and stroke.  Plan: - Encourage lifestyle modifications for weight loss - Encouraged him to reach out to his psychiatrist to potentially change him off of olanzapine 

## 2023-11-14 NOTE — Patient Instructions (Addendum)
 Victor Gomez,Thank you for allowing me to take part in your care today.  Here are your instructions.  1. Please come back in  2 months.   2.  I checked your A1c today.  3. Please start taking Movantik, this is another medication for your constipation.  Stop the Linzess .  4.  I am writing for Chantix .  Please take it at this schedule.  Initiate regimen 1 week before quit smoking date Days 1-3: 0.5 mg by mouth daily Days 4-7: 0.5 mg by mouth twice daily Day 8 to end of treatment: 1 mg by mouth twice daily  PLEASE BRING YOUR MEDICATIONS TO EVERY APPOINTMENT  Thank you, Dr. Tobie  If you have any other questions please contact the internal medicine clinic at [phone] If it is after hours, please call the [place] [facility] at [phone] and then ask the person who picks up for the resident on call.

## 2023-11-14 NOTE — Assessment & Plan Note (Signed)
 Patient has a past medical history of obesity.  Checked A1c today, and he is at 6.1.  This makes him prediabetic.  Encouraged him to make lifestyle changes.  He agreed to exercise and eat better.  Plan: - Encouraged lifestyle modifications

## 2023-11-14 NOTE — Progress Notes (Signed)
 CC: Abdominal pain follow-up  HPI:  [name] is a 48 y.o. male with a past medical history of opiate induced constipation, methamphetamine induced psychotic disorder, obesity who presents for chronic abdominal pain.  Please see assessment and plan for full HPI.  Medications: Hyperlipidemia: Crestor  20 mg daily Opioid-induced constipation: Movantik 12.5 mg daily Psychosis disorder: Wellbutrin  300 mg daily, BuSpar  15 mg 3 times daily, olanzapine  15 mg twice daily Bupropion : Gabapentin  200 mg 3 times daily Opioids use: Methadone  Tobacco use: Nicotine  patch Insomnia: Trazodone  100 mg daily BPH: Tamsulosin  0.4 mg daily  Past Medical History:  Diagnosis Date   ADHD    Anxiety    Cancer (HCC)    Skin   Elevated transaminase level 03/13/2013   Hepatitis C    Hyperlipidemia    Kidney stone    MDD (major depressive disorder)    Methadone  use    clinic each day   Psychoactive substance-induced psychosis (HCC)    Substance abuse (HCC)    Substance induced mood disorder (HCC)    Tibia/fibula fracture, shaft, unspecified laterality, sequela 05/31/2017     Current Outpatient Medications:    naloxegol oxalate (MOVANTIK) 12.5 MG TABS tablet, Take 1 tablet (12.5 mg total) by mouth daily., Disp: 30 tablet, Rfl: 11   Varenicline  Tartrate, Starter, (CHANTIX  STARTING MONTH PAK) 0.5 MG X 11 & 1 MG X 42 TBPK, Take 1 mg by mouth in the morning and at bedtime., Disp: 53 each, Rfl: 0   buPROPion  (WELLBUTRIN  XL) 300 MG 24 hr tablet, Take 1 tablet (300 mg total) by mouth every morning., Disp: 30 tablet, Rfl: 2   busPIRone  (BUSPAR ) 15 MG tablet, Take 1 tablet (15 mg total) by mouth 3 (three) times daily., Disp: 90 tablet, Rfl: 1   diphenhydrAMINE  (BENADRYL ) 50 MG capsule, Take 50 mg by mouth every 6 (six) hours as needed., Disp: , Rfl:    gabapentin  (NEURONTIN ) 300 MG capsule, Take 1 capsule (300 mg total) by mouth 3 (three) times daily., Disp: 90 capsule, Rfl: 1   linaclotide  (LINZESS ) 145  MCG CAPS capsule, Take 1 capsule (145 mcg total) by mouth daily before breakfast., Disp: 30 capsule, Rfl: 3   METHADONE  HCL PO, Take by mouth. 160, Disp: , Rfl:    Multiple Vitamins-Minerals (MULTIVITAMIN ADULTS PO), Take by mouth., Disp: , Rfl:    naproxen  (NAPROSYN ) 500 MG tablet, Take 1 tablet (500 mg total) by mouth as needed., Disp: 60 tablet, Rfl: 2   nicotine  (NICODERM CQ  - DOSED IN MG/24 HOURS) 21 mg/24hr patch, Place 1 patch (21 mg total) onto the skin daily. (Patient not taking: Reported on 03/29/2023), Disp: 30 patch, Rfl: 1   nicotine  polacrilex (NICORETTE ) 4 MG gum, Take 1 each (4 mg total) by mouth as needed for smoking cessation., Disp: 100 tablet, Rfl: 0   OLANZapine  (ZYPREXA ) 15 MG tablet, Take 1 tablet (15 mg total) by mouth 2 (two) times daily., Disp: 60 tablet, Rfl: 1   ondansetron  (ZOFRAN ) 4 MG tablet, Take 1 tablet (4 mg total) by mouth every 8 (eight) hours as needed for nausea or vomiting. Take 1 tablet (4mg  total) 30 minutes prior to each Suprep dose. (Patient not taking: Reported on 07/24/2023), Disp: 4 tablet, Rfl: 0   rosuvastatin  (CRESTOR ) 20 MG tablet, Take 1 tablet (20 mg total) by mouth daily., Disp: 90 tablet, Rfl: 3   tamsulosin  (FLOMAX ) 0.4 MG CAPS capsule, Take 1 capsule (0.4 mg total) by mouth daily. (Patient not taking: Reported on 07/24/2023),  Disp: 30 capsule, Rfl: 1   traZODone  (DESYREL ) 100 MG tablet, Take 100 mg by mouth. (Patient not taking: Reported on 07/24/2023), Disp: , Rfl:    TYLENOL  500 MG tablet, Take 500-1,000 mg by mouth every 6 (six) hours as needed for mild pain or headache., Disp: , Rfl:   Review of Systems:    Negative except for what is stated in HPI  Physical Exam:  Vitals:   11/14/23 1523  BP: 122/84  Pulse: 92  SpO2: 93%  Weight: (!) 332 lb (150.6 kg)  Height: 6' (1.829 m)   General: Patient is sitting comfortably in the room  Head: Normocephalic, atraumatic  Cardio: Regular rate and rhythm, no murmurs, rubs or gallops Pulmonary:  Clear to ausculation bilaterally with no rales, rhonchi, and crackles  Abdomen: Soft, nontender, hypoactive bowel sounds   Assessment & Plan:   Assessment & Plan 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  His BMI is 45.  He has gained a significant amount of weight over the past year.  Patient's mother does endorse that patient does have poor eating habits.  Patient also is on olanzapine .  Likely multifactorial.  Encouraged lifestyle modifications.  A1c today 6.1.  This makes him prediabetic.  He is also diagnosed with hyperlipidemia.  He is developing components of metabolic syndrome that can put him at risk for heart attack and stroke.  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  He is on chronic methadone  for opioid use disorder.  He previously has been on MiraLAX, senna-docusate, Linzess  for constipation.  He is still having trouble with moving his bowels.  He strains every time he is on the toilet.  Patient has trialed multiple stool softeners and motility agents without any relief.  He has about 1 bowel movement every other day, and sometimes goes 2 days without a bowel movement.  He develops abdominal discomfort due to this.  He states he would like another medication.  Discussed Movantik with patient today.  Plan: - Start Movantik - Stop Linzess  Cigarette smoker Discussed tobacco use with patient today.  Encouraged him to quit smoking.  Offered Chantix , and he accepted.    Plan: Start Chantix  - 7 minutes spent on counseling Prediabetes Patient has a past medical history of obesity.  Checked A1c today, and he is at 6.1.  This makes him prediabetic.  Encouraged him to  make lifestyle changes.  He agreed to exercise and eat better.  Plan: - Encouraged lifestyle modifications   Patient discussed with Dr. Rosan Libby Blanch, DO Internal Medicine Resident PGY-3

## 2023-11-14 NOTE — Assessment & Plan Note (Signed)
 Discussed tobacco use with patient today.  Encouraged him to quit smoking.  Offered Chantix , and he accepted.    Plan: Start Chantix  - 7 minutes spent on counseling

## 2023-11-14 NOTE — Assessment & Plan Note (Signed)
 Patient history of opioid-induced constipation.  He is on chronic methadone  for opioid use disorder.  He previously has been on MiraLAX, senna-docusate, Linzess  for constipation.  He is still having trouble with moving his bowels.  He strains every time he is on the toilet.  Patient has trialed multiple stool softeners and motility agents without any relief.  He has about 1 bowel movement every other day, and sometimes goes 2 days without a bowel movement.  He develops abdominal discomfort due to this.  He states he would like another medication.  Discussed Movantik with patient today.  Plan: - Start Movantik - Stop Linzess 

## 2023-11-15 ENCOUNTER — Other Ambulatory Visit: Payer: Self-pay

## 2023-11-15 ENCOUNTER — Other Ambulatory Visit (HOSPITAL_COMMUNITY): Payer: Self-pay

## 2023-11-15 ENCOUNTER — Telehealth: Payer: Self-pay | Admitting: *Deleted

## 2023-11-15 MED ORDER — VARENICLINE TARTRATE 0.5 MG PO TABS
ORAL_TABLET | ORAL | 0 refills | Status: AC
  Filled 2023-11-15: qty 56, 28d supply, fill #0
  Filled 2023-12-07: qty 56, 28d supply, fill #1
  Filled 2024-01-04: qty 56, 28d supply, fill #2
  Filled 2024-02-01: qty 56, 28d supply, fill #3
  Filled 2024-02-22: qty 56, 28d supply, fill #4
  Filled 2024-03-21: qty 56, 28d supply, fill #5

## 2023-11-15 NOTE — Telephone Encounter (Signed)
 Will forward to Dr. Tobie.                             Copied from CRM [id number]. Topic: Clinical - Prescription Issue >> Nov 15, 2023  9:43 AM Graeme ORN wrote: Reason for CRM: Pharmacy called about starter pack for Chantix . Directions don't match Rx. Should it be starter pack instructions or a different Rx. Can resend or call. Thank You

## 2023-11-19 ENCOUNTER — Other Ambulatory Visit (HOSPITAL_COMMUNITY): Payer: Self-pay

## 2023-11-19 ENCOUNTER — Telehealth (HOSPITAL_COMMUNITY): Payer: Self-pay

## 2023-11-19 ENCOUNTER — Other Ambulatory Visit: Payer: Self-pay

## 2023-11-19 ENCOUNTER — Other Ambulatory Visit (HOSPITAL_BASED_OUTPATIENT_CLINIC_OR_DEPARTMENT_OTHER): Payer: Self-pay

## 2023-11-19 MED ORDER — LUBIPROSTONE 24 MCG PO CAPS
24.0000 ug | ORAL_CAPSULE | Freq: Two times a day (BID) | ORAL | 0 refills | Status: AC
  Filled 2023-11-19: qty 60, 30d supply, fill #0

## 2023-11-19 NOTE — Telephone Encounter (Signed)
 Pharmacy Patient Advocate Encounter   Received notification from Pt Calls Messages that prior authorization for MOVANTIK 12.5MG  tablets  is required/requested.   Insurance verification completed.   The patient is insured through [place] MEDICAID.   Per test claim: PA required; PA submitted to above mentioned insurance via Latent Key/confirmation #/EOC [id number] Status is pending

## 2023-11-19 NOTE — Addendum Note (Signed)
 Addended by: Dalyce Renne on: 11/19/2023 05:25 PM   Modules accepted: Orders

## 2023-11-19 NOTE — Telephone Encounter (Signed)
 Pharmacy Patient Advocate Encounter  Received notification from [employer] [place] MEDICAID that Prior Authorization for MOVANTIK 12.5MG  tablets  has been DENIED.  Full denial letter will be uploaded to the media tab. See denial reason below.   PA #/Case ID/Reference #: [id number]

## 2023-11-19 NOTE — Telephone Encounter (Signed)
 PA request has been Received. New Encounter has been or will be created for follow up. For additional info see Pharmacy Prior Auth telephone encounter from 11/19/23.

## 2023-11-20 ENCOUNTER — Other Ambulatory Visit (HOSPITAL_COMMUNITY): Payer: Self-pay

## 2023-11-20 ENCOUNTER — Other Ambulatory Visit: Payer: Self-pay

## 2023-11-21 NOTE — Progress Notes (Signed)
 Internal Medicine Clinic Attending  Case discussed with the resident at the time of the visit.  We reviewed the resident's history and exam and pertinent patient test results.  I agree with the assessment, diagnosis, and plan of care documented in the resident's note.

## 2023-12-14 ENCOUNTER — Other Ambulatory Visit (HOSPITAL_COMMUNITY): Payer: Self-pay

## 2024-01-14 ENCOUNTER — Other Ambulatory Visit: Payer: Self-pay

## 2024-01-21 ENCOUNTER — Ambulatory Visit: Payer: Self-pay

## 2024-01-28 ENCOUNTER — Other Ambulatory Visit: Payer: Self-pay

## 2024-01-28 ENCOUNTER — Other Ambulatory Visit: Payer: Self-pay | Admitting: Student

## 2024-01-28 NOTE — Telephone Encounter (Signed)
 Medication has expired off of the patients med list.

## 2024-01-30 ENCOUNTER — Telehealth: Payer: Self-pay

## 2024-01-30 ENCOUNTER — Other Ambulatory Visit (HOSPITAL_COMMUNITY): Payer: Self-pay

## 2024-01-30 ENCOUNTER — Telehealth: Payer: Self-pay | Admitting: *Deleted

## 2024-01-30 NOTE — Telephone Encounter (Signed)
 Unable to pend

## 2024-01-30 NOTE — Telephone Encounter (Unsigned)
 Copied from CRM #[id number]. Topic: Clinical - Medication Refill >> Jan 30, 2024  5:15 PM Chiquita SQUIBB wrote: Medication: lubiprostone  (AMITIZA ) 24 MCG capsule    Patients mother called in stating the wrong medication was filled and this is the medication the patient needs. It is listed as ended but the patient is still taking it but now has ran out.   Has the patient contacted their pharmacy? Yes (Agent: If no, request that the patient contact the pharmacy for the refill. If patient does not wish to contact the pharmacy document the reason why and proceed with request.) (Agent: If yes, when and what did the pharmacy advise?)  This is the patient's preferred pharmacy:  [place] [facility] [address] [place] [address] Phone: [phone] Fax: [phone]   Is this the correct pharmacy for this prescription? Yes If no, delete pharmacy and type the correct one.   Has the prescription been filled recently? No  Is the patient out of the medication? Yes  Has the patient been seen for an appointment in the last year OR does the patient have an upcoming appointment? Yes  Can we respond through MyChart? Yes  Agent: Please be advised that Rx refills may take up to 3 business days. We ask that you follow-up with your pharmacy.

## 2024-01-30 NOTE — Telephone Encounter (Signed)
 Patient's mother here to ask about refill of the Movantix.  Call to Pharmacy patient does have refills but, will not be able to pick up until 02/04/2024.  Last fill was on 01/15/2024.   Patient's mother to go back home to check to see if she has mixed up or placed the refill somewhere else in her home.  Patient's mother states medication does help.  If al else fails will give patient more of the Stool Softener

## 2024-01-31 NOTE — Telephone Encounter (Signed)
 Lubiprostone  has expired and dropped off current med list.; sending to PCP frr refill if appropriate.

## 2024-01-31 NOTE — Telephone Encounter (Signed)
 Copied from CRM #[id number]. Topic: Clinical - Prescription Issue >> Jan 31, 2024  4:04 PM Graeme ORN wrote: Reason for CRM: Patient Mom called to check on medication lubiprostone . States she has been requested. He last had filled in October. She is not sure what the problem is and why it has not been filled. Let her know it is pending was sent to provider today. She would like an update when available. States patient keeps asking about it - does not want to have his bowels backed up. Thank You

## 2024-02-12 ENCOUNTER — Other Ambulatory Visit: Payer: Self-pay

## 2024-02-19 ENCOUNTER — Other Ambulatory Visit: Payer: Self-pay

## 2024-02-20 ENCOUNTER — Other Ambulatory Visit (HOSPITAL_COMMUNITY): Payer: Self-pay
